# Patient Record
Sex: Female | Born: 1935 | Race: White | Hispanic: No | Marital: Married | Smoking: Never smoker
Health system: Southern US, Community
[De-identification: ages and names within clinical notes are randomized; demographics above are authoritative.]

## PROBLEM LIST (undated history)

## (undated) DIAGNOSIS — N6019 Diffuse cystic mastopathy of unspecified breast: Secondary | ICD-10-CM

## (undated) DIAGNOSIS — F32A Depression, unspecified: Secondary | ICD-10-CM

## (undated) DIAGNOSIS — I10 Essential (primary) hypertension: Secondary | ICD-10-CM

## (undated) DIAGNOSIS — K222 Esophageal obstruction: Secondary | ICD-10-CM

## (undated) DIAGNOSIS — K589 Irritable bowel syndrome without diarrhea: Secondary | ICD-10-CM

## (undated) DIAGNOSIS — R51 Headache: Secondary | ICD-10-CM

## (undated) DIAGNOSIS — F329 Major depressive disorder, single episode, unspecified: Secondary | ICD-10-CM

## (undated) DIAGNOSIS — C50919 Malignant neoplasm of unspecified site of unspecified female breast: Secondary | ICD-10-CM

## (undated) DIAGNOSIS — T7840XA Allergy, unspecified, initial encounter: Secondary | ICD-10-CM

## (undated) DIAGNOSIS — F419 Anxiety disorder, unspecified: Secondary | ICD-10-CM

## (undated) DIAGNOSIS — G8929 Other chronic pain: Secondary | ICD-10-CM

## (undated) DIAGNOSIS — K5790 Diverticulosis of intestine, part unspecified, without perforation or abscess without bleeding: Secondary | ICD-10-CM

## (undated) DIAGNOSIS — Z923 Personal history of irradiation: Secondary | ICD-10-CM

## (undated) DIAGNOSIS — K219 Gastro-esophageal reflux disease without esophagitis: Secondary | ICD-10-CM

## (undated) DIAGNOSIS — R32 Unspecified urinary incontinence: Secondary | ICD-10-CM

## (undated) DIAGNOSIS — K2270 Barrett's esophagus without dysplasia: Secondary | ICD-10-CM

## (undated) DIAGNOSIS — M1990 Unspecified osteoarthritis, unspecified site: Secondary | ICD-10-CM

## (undated) DIAGNOSIS — E785 Hyperlipidemia, unspecified: Secondary | ICD-10-CM

## (undated) DIAGNOSIS — R519 Headache, unspecified: Secondary | ICD-10-CM

## (undated) HISTORY — DX: Irritable bowel syndrome, unspecified: K58.9

## (undated) HISTORY — DX: Hyperlipidemia, unspecified: E78.5

## (undated) HISTORY — PX: OTHER SURGICAL HISTORY: SHX169

## (undated) HISTORY — PX: DILATION AND CURETTAGE OF UTERUS: SHX78

## (undated) HISTORY — PX: ABDOMINAL HYSTERECTOMY: SHX81

## (undated) HISTORY — DX: Unspecified urinary incontinence: R32

## (undated) HISTORY — DX: Allergy, unspecified, initial encounter: T78.40XA

## (undated) HISTORY — DX: Gastro-esophageal reflux disease without esophagitis: K21.9

## (undated) HISTORY — PX: CARPAL TUNNEL RELEASE: SHX101

## (undated) HISTORY — DX: Esophageal obstruction: K22.2

## (undated) HISTORY — PX: COLONOSCOPY: SHX174

## (undated) HISTORY — DX: Barrett's esophagus without dysplasia: K22.70

## (undated) HISTORY — DX: Diffuse cystic mastopathy of unspecified breast: N60.19

## (undated) HISTORY — DX: Major depressive disorder, single episode, unspecified: F32.9

## (undated) HISTORY — DX: Anxiety disorder, unspecified: F41.9

## (undated) HISTORY — DX: Headache: R51

## (undated) HISTORY — DX: Malignant neoplasm of unspecified site of unspecified female breast: C50.919

## (undated) HISTORY — DX: Other chronic pain: G89.29

## (undated) HISTORY — PX: UPPER GASTROINTESTINAL ENDOSCOPY: SHX188

## (undated) HISTORY — PX: BREAST BIOPSY: SHX20

## (undated) HISTORY — DX: Essential (primary) hypertension: I10

## (undated) HISTORY — DX: Diverticulosis of intestine, part unspecified, without perforation or abscess without bleeding: K57.90

## (undated) HISTORY — PX: CATARACT EXTRACTION: SUR2

## (undated) HISTORY — DX: Depression, unspecified: F32.A

## (undated) HISTORY — DX: Unspecified osteoarthritis, unspecified site: M19.90

## (undated) HISTORY — DX: Headache, unspecified: R51.9

---

## 2001-11-06 ENCOUNTER — Encounter: Payer: Self-pay | Admitting: Internal Medicine

## 2003-10-11 ENCOUNTER — Encounter: Admission: RE | Admit: 2003-10-11 | Discharge: 2003-10-11 | Payer: Self-pay | Admitting: Otolaryngology

## 2005-02-12 ENCOUNTER — Ambulatory Visit: Payer: Self-pay | Admitting: Internal Medicine

## 2005-09-18 ENCOUNTER — Ambulatory Visit: Payer: Self-pay | Admitting: General Practice

## 2005-09-18 ENCOUNTER — Other Ambulatory Visit: Payer: Self-pay

## 2005-09-25 ENCOUNTER — Ambulatory Visit: Payer: Self-pay | Admitting: General Practice

## 2007-05-20 ENCOUNTER — Ambulatory Visit: Payer: Self-pay | Admitting: General Practice

## 2009-04-11 DIAGNOSIS — K5730 Diverticulosis of large intestine without perforation or abscess without bleeding: Secondary | ICD-10-CM | POA: Insufficient documentation

## 2009-04-11 DIAGNOSIS — K219 Gastro-esophageal reflux disease without esophagitis: Secondary | ICD-10-CM

## 2009-04-11 DIAGNOSIS — F419 Anxiety disorder, unspecified: Secondary | ICD-10-CM

## 2009-04-11 DIAGNOSIS — K222 Esophageal obstruction: Secondary | ICD-10-CM

## 2009-04-11 DIAGNOSIS — K589 Irritable bowel syndrome without diarrhea: Secondary | ICD-10-CM | POA: Insufficient documentation

## 2009-04-11 DIAGNOSIS — R109 Unspecified abdominal pain: Secondary | ICD-10-CM | POA: Insufficient documentation

## 2009-04-11 DIAGNOSIS — F329 Major depressive disorder, single episode, unspecified: Secondary | ICD-10-CM

## 2009-04-17 ENCOUNTER — Ambulatory Visit: Payer: Self-pay | Admitting: Internal Medicine

## 2009-04-18 ENCOUNTER — Encounter: Payer: Self-pay | Admitting: Internal Medicine

## 2009-04-18 ENCOUNTER — Ambulatory Visit: Payer: Self-pay | Admitting: Internal Medicine

## 2009-04-20 ENCOUNTER — Encounter: Payer: Self-pay | Admitting: Internal Medicine

## 2009-05-15 ENCOUNTER — Telehealth: Payer: Self-pay | Admitting: Internal Medicine

## 2009-11-13 ENCOUNTER — Telehealth: Payer: Self-pay | Admitting: Internal Medicine

## 2010-09-06 NOTE — Progress Notes (Signed)
Summary: refill  Phone Note From Pharmacy Call back at [phone]   Caller: Shawna Orleans, pharm tech Call For: Dr. Juanda Chance  Summary of Call: needs refill of Bentyl  Initial call taken by: Vallarie Mare,  November 13, 2009 2:53 PM    Prescriptions: BENTYL 20 MG TABS (DICYCLOMINE HCL) Take 1 tablet by mouth two times a day  #60 x 3   Entered by:   Hortense Ramal CMA (AAMA)   Authorized by:   Hart Carwin MD   Signed by:   Hortense Ramal CMA (AAMA) on 11/13/2009   Method used:   Electronically to        [facility] [address] (retail)       [address]       [place], [place]  [address]       Ph: [phone]       Fax: [phone]   RxID:   [id number]

## 2010-11-09 LAB — GLUCOSE, CAPILLARY: Glucose-Capillary: 78 mg/dL (ref 70–99)

## 2011-05-24 ENCOUNTER — Encounter: Payer: Self-pay | Admitting: Internal Medicine

## 2011-09-09 ENCOUNTER — Encounter: Payer: Self-pay | Admitting: *Deleted

## 2011-09-17 ENCOUNTER — Encounter: Payer: Self-pay | Admitting: Internal Medicine

## 2011-09-17 ENCOUNTER — Ambulatory Visit (INDEPENDENT_AMBULATORY_CARE_PROVIDER_SITE_OTHER): Payer: Medicare Other | Admitting: Internal Medicine

## 2011-09-17 VITALS — BP 134/76 | HR 80 | Ht 63.0 in | Wt 191.0 lb

## 2011-09-17 DIAGNOSIS — K2270 Barrett's esophagus without dysplasia: Secondary | ICD-10-CM

## 2011-09-17 DIAGNOSIS — R197 Diarrhea, unspecified: Secondary | ICD-10-CM

## 2011-09-17 MED ORDER — CILIDINIUM-CHLORDIAZEPOXIDE 2.5-5 MG PO CAPS: 1.0000 | ORAL_CAPSULE | Freq: Two times a day (BID) | ORAL | Status: DC

## 2011-09-17 MED ORDER — COLESTIPOL HCL 1 G PO TABS: ORAL_TABLET | ORAL | Status: DC

## 2011-09-17 NOTE — Patient Instructions (Signed)
You have been scheduled for an endoscopy with propofol. Please follow written instructions given to you at your visit today. You will be due for a recall colonoscopy in 04/2014. We will send you a reminder in the mail when it gets closer to that time. We have sent the following medications to your pharmacy for you to pick up at your convenience: Colestid 2 tablets daily Librax twice daily CC: Dr Alonna Buckler

## 2011-09-17 NOTE — Progress Notes (Signed)
Paige Dougherty [birth year] MRN [id number]   History of Present Illness:  This is a 78 year old white female with a history of Barrett's esophagus diagnosed on an upper endoscopy in September 2010. She is here for a recall upper endoscopy. She is a diabetic. She also has a family history of colon cancer in her father and underwent her last colonoscopy in September 2010 with findings of a tubular adenoma. She will be due for a repeat colonoscopy in September 2015. She has a history of depression controlled on Paxil. She has chronic diarrhea attributed to irritable bowel syndrome. She has been on Bentyl 20 mg which does not seem to be helping. Random biopsies of the colon in 2010 did not show any evidence of microscopic colitis.   Past Medical History  Diagnosis Date  . Diverticulosis   . GERD (gastroesophageal reflux disease)   . Esophageal stricture   . Depression   . Anxiety   . Barrett's esophagus   . IBS (irritable bowel syndrome)   . Adenomatous colon polyp   . Diabetes mellitus   . Hypertension   . Hyperlipemia    Past Surgical History  Procedure Date  . Dilation and curettage of uterus   . Abdominal hysterectomy   . Cataract extraction     bilateral   . Carpal tunnel release     bilateral  . Finger cyst removal   . Neck cyst removal     reports that she has never smoked. She has never used smokeless tobacco. She reports that she drinks alcohol. She reports that she does not use illicit drugs. family history includes Breast cancer in her maternal grandmother; Colon cancer in her father; Hypertension in her mother; and Thyroid disease in her father and mother. Allergies  Allergen Reactions  . Penicillins   . Tetracycline         Review of Systems: Occasional acid reflux. Denies dysphagia. Denies abdominal pain positive for occasional rectal bleeding  The remainder of the 10 point ROS is negative except as outlined in H&P   Physical Exam: General appearance  Well  developed, in no distress. Overweight Eyes- non icteric. HEENT nontraumatic, normocephalic. Mouth no lesions, tongue papillated, no cheilosis. Neck supple without adenopathy, thyroid not enlarged, no carotid bruits, no JVD. Lungs Clear to auscultation bilaterally. Cor normal S1, normal S2, regular rhythm, no murmur,  quiet precordium. Abdomen: Obese soft with tenderness in left lower quadrant. Normal active bowel sounds. Liver edge at costal margin. Rectal: Sulfa and Hemoccult negative stool Extremities no pedal edema. Skin no lesions. Neurological alert and oriented x 3. Psychological normal mood and affect.  Assessment and Plan:  Problem #1 Barrett's esophagus. Patient is on Prilosec 20 mg daily. We will repeat an upper endoscopy and biopsies to rule out dysplasia. She will continue on anti-reflex measures.  Problem #2 History of adenomatous polyp. Her last colonoscopy was in September 2010. She has a family history of colon cancer in a direct relative. She will have a repeat colonoscopy in September 2015.  Problem #3 Chronic diarrhea, likely irritable bowel syndrome. We will add Colestid 2 g daily and Librax 1 by mouth twice a day in place of Bentyl.   09/17/2011 Lina Sar

## 2011-10-07 ENCOUNTER — Ambulatory Visit (AMBULATORY_SURGERY_CENTER): Payer: Medicare Other | Admitting: Internal Medicine

## 2011-10-07 ENCOUNTER — Encounter: Payer: Self-pay | Admitting: Internal Medicine

## 2011-10-07 DIAGNOSIS — K222 Esophageal obstruction: Secondary | ICD-10-CM

## 2011-10-07 DIAGNOSIS — K2980 Duodenitis without bleeding: Secondary | ICD-10-CM

## 2011-10-07 DIAGNOSIS — K2960 Other gastritis without bleeding: Secondary | ICD-10-CM

## 2011-10-07 DIAGNOSIS — K2270 Barrett's esophagus without dysplasia: Secondary | ICD-10-CM

## 2011-10-07 DIAGNOSIS — R1013 Epigastric pain: Secondary | ICD-10-CM

## 2011-10-07 MED ORDER — OMEPRAZOLE 20 MG PO CPDR: 20.0000 mg | DELAYED_RELEASE_CAPSULE | Freq: Two times a day (BID) | ORAL | Status: DC

## 2011-10-07 MED ORDER — SODIUM CHLORIDE 0.9 % IV SOLN: 500.0000 mL | INTRAVENOUS | Status: DC

## 2011-10-07 NOTE — Patient Instructions (Signed)

## 2011-10-07 NOTE — Progress Notes (Signed)
Patient did not experience any of the following events: a burn prior to discharge; a fall within the facility; wrong site/side/patient/procedure/implant event; or a hospital transfer or hospital admission upon discharge from the facility. (G8907) Patient with preoperative order for IV antibiotic SSI prophylaxis, antibiotic initiated on time. (G8916)  

## 2011-10-07 NOTE — Op Note (Signed)
 [facility] [address] [place], [place]  [address]  ENDOSCOPY PROCEDURE REPORT  PATIENT:  Paige Dougherty, Paige Dougherty  MR#:  [id number] BIRTHDATE:  Jun 28, 1933, 78 yrs. old  GENDER:  female  ENDOSCOPIST:  Hedwig Morton. Juanda Chance, MD Referred by:  Alonna Buckler, M.D.  PROCEDURE DATE:  10/07/2011 PROCEDURE:  EGD with biopsy, 43239 ASA CLASS:  Class II INDICATIONS:  abdominal pain, h/o Barrett's Esophagus EGD 09/2008- Barrett's now postpradial abd. pain.refractory to Prilosec 20 mg qd  MEDICATIONS:   MAC sedation, administered by CRNA, propofol (Diprivan) 340 mg TOPICAL ANESTHETIC:  none  DESCRIPTION OF PROCEDURE:   After the risks benefits and alternatives of the procedure were thoroughly explained, informed consent was obtained.  The LB GIF-H180 [id number] endoscope was introduced through the mouth and advanced to the second portion of the duodenum, without limitations.  The instrument was slowly withdrawn as the mucosa was fully examined. <<PROCEDUREIMAGES>>  Esophagitis was found in the distal esophagus. grade 1 esophagitis, nonobstructing fibrous ring With standard forceps, a biopsy was obtained and sent to pathology (see image1, image2, image12, image10, and image11).  There were multiple polyps identified (see image9, image8, and image7). fundic gland polyps Mild gastritis was found. With standard forceps, a biopsy was obtained and sent to pathology. r/o H (see image6).Pylori  The duodenal bulb was normal in appearance, as was the postbulbar duodenum. With standard forceps, a biopsy was obtained and sent to pathology (see image4 and image5). duodenum to Ligament of Treitz Retroflexed views revealed no abnormalities.    The scope was then withdrawn from the patient and the procedure completed.  COMPLICATIONS:  None  ENDOSCOPIC IMPRESSION: 1) Esophagitis in the distal esophagus 2) Polyps, multiple 3) Mild gastritis 4) Normal duodenum RECOMMENDATIONS: 1) Await biopsy  results increase Prilosec to 20 mg po bid  REPEAT EXAM:  In 2 year(s) for.  ______________________________ Hedwig Morton. Juanda Chance, MD  CC:  n. eSIGNED:   Hedwig Morton. Joclynn Lumb at 10/07/2011 02:44 PM  Neill Loft, [phone]

## 2011-10-08 ENCOUNTER — Telehealth: Payer: Self-pay | Admitting: *Deleted

## 2011-10-08 NOTE — Telephone Encounter (Signed)
  Follow up Call-  Call back number 10/07/2011  Post procedure Call Back phone  # [phone]  Permission to leave phone message Yes     Patient questions:  Do you have a fever, pain , or abdominal swelling? no Pain Score  0 *  Have you tolerated food without any problems? yes  Have you been able to return to your normal activities? yes  Do you have any questions about your discharge instructions: Diet   no Medications  no Follow up visit  no  Do you have questions or concerns about your Care? yes  Actions: * If pain score is 4 or above: No action needed, pain <4.

## 2011-10-15 ENCOUNTER — Encounter: Payer: Self-pay | Admitting: Internal Medicine

## 2014-02-21 ENCOUNTER — Encounter: Payer: Self-pay | Admitting: Internal Medicine

## 2014-02-22 DIAGNOSIS — I10 Essential (primary) hypertension: Secondary | ICD-10-CM | POA: Insufficient documentation

## 2014-02-22 DIAGNOSIS — E119 Type 2 diabetes mellitus without complications: Secondary | ICD-10-CM | POA: Insufficient documentation

## 2014-02-22 DIAGNOSIS — E7849 Other hyperlipidemia: Secondary | ICD-10-CM | POA: Insufficient documentation

## 2014-08-05 DIAGNOSIS — Z923 Personal history of irradiation: Secondary | ICD-10-CM

## 2014-08-05 DIAGNOSIS — C50919 Malignant neoplasm of unspecified site of unspecified female breast: Secondary | ICD-10-CM

## 2014-08-05 HISTORY — PX: BREAST EXCISIONAL BIOPSY: SUR124

## 2014-08-05 HISTORY — DX: Personal history of irradiation: Z92.3

## 2014-08-05 HISTORY — PX: BREAST LUMPECTOMY: SHX2

## 2014-08-05 HISTORY — DX: Malignant neoplasm of unspecified site of unspecified female breast: C50.919

## 2014-09-13 ENCOUNTER — Ambulatory Visit: Payer: Self-pay | Admitting: Family Medicine

## 2014-09-13 LAB — CREATININE, SERUM: Creatine, Serum: 0.88

## 2014-09-20 ENCOUNTER — Ambulatory Visit: Payer: Self-pay | Admitting: Internal Medicine

## 2014-09-28 ENCOUNTER — Ambulatory Visit: Payer: Self-pay | Admitting: Surgery

## 2014-10-04 ENCOUNTER — Ambulatory Visit
Admit: 2014-10-04 | Disposition: A | Discharge status: Home / Self Care | Payer: Self-pay | Attending: Oncology | Admitting: Oncology

## 2014-10-04 ENCOUNTER — Ambulatory Visit
Admit: 2014-10-04 | Disposition: A | Discharge status: Home / Self Care | Payer: Self-pay | Attending: Internal Medicine | Admitting: Internal Medicine

## 2014-10-07 ENCOUNTER — Ambulatory Visit: Payer: Self-pay | Admitting: Surgery

## 2014-10-12 ENCOUNTER — Encounter: Payer: Self-pay | Admitting: Internal Medicine

## 2014-11-04 ENCOUNTER — Ambulatory Visit
Admit: 2014-11-04 | Disposition: A | Discharge status: Home / Self Care | Payer: Self-pay | Attending: Oncology | Admitting: Oncology

## 2014-11-04 ENCOUNTER — Ambulatory Visit
Admit: 2014-11-04 | Disposition: A | Discharge status: Home / Self Care | Payer: Self-pay | Attending: Internal Medicine | Admitting: Internal Medicine

## 2014-11-18 LAB — CBC CANCER CENTER
BASOS PCT: 0.9 %
Basophil #: 0 x10 3/mm (ref 0.0–0.1)
EOS ABS: 0.1 x10 3/mm (ref 0.0–0.7)
EOS PCT: 2 %
HCT: 41.4 % (ref 35.0–47.0)
HGB: 13.6 g/dL (ref 12.0–16.0)
LYMPHS PCT: 25.7 %
Lymphocyte #: 1.2 x10 3/mm (ref 1.0–3.6)
MCH: 29.6 pg (ref 26.0–34.0)
MCHC: 32.8 g/dL (ref 32.0–36.0)
MCV: 90 fL (ref 80–100)
Monocyte #: 0.3 x10 3/mm (ref 0.2–0.9)
Monocyte %: 7.4 %
NEUTROS ABS: 3 x10 3/mm (ref 1.4–6.5)
Neutrophil %: 64 %
Platelet: 242 x10 3/mm (ref 150–440)
RBC: 4.59 10*6/uL (ref 3.80–5.20)
RDW: 14.6 % — ABNORMAL HIGH (ref 11.5–14.5)
WBC: 4.7 x10 3/mm (ref 3.6–11.0)

## 2014-11-25 LAB — CBC CANCER CENTER
BASOS ABS: 0 x10 3/mm (ref 0.0–0.1)
Basophil %: 0.8 %
Eosinophil #: 0.1 x10 3/mm (ref 0.0–0.7)
Eosinophil %: 2.4 %
HCT: 38.8 % (ref 35.0–47.0)
HGB: 13 g/dL (ref 12.0–16.0)
LYMPHS ABS: 1 x10 3/mm (ref 1.0–3.6)
LYMPHS PCT: 19.2 %
MCH: 30.1 pg (ref 26.0–34.0)
MCHC: 33.6 g/dL (ref 32.0–36.0)
MCV: 90 fL (ref 80–100)
Monocyte #: 0.5 x10 3/mm (ref 0.2–0.9)
Monocyte %: 9.6 %
NEUTROS ABS: 3.6 x10 3/mm (ref 1.4–6.5)
Neutrophil %: 68 %
Platelet: 228 x10 3/mm (ref 150–440)
RBC: 4.33 10*6/uL (ref 3.80–5.20)
RDW: 14.7 % — AB (ref 11.5–14.5)
WBC: 5.3 x10 3/mm (ref 3.6–11.0)

## 2014-11-28 DIAGNOSIS — R32 Unspecified urinary incontinence: Secondary | ICD-10-CM | POA: Insufficient documentation

## 2014-11-28 DIAGNOSIS — R51 Headache: Secondary | ICD-10-CM

## 2014-11-28 DIAGNOSIS — G8929 Other chronic pain: Secondary | ICD-10-CM | POA: Insufficient documentation

## 2014-11-28 DIAGNOSIS — N6019 Diffuse cystic mastopathy of unspecified breast: Secondary | ICD-10-CM | POA: Insufficient documentation

## 2014-11-28 DIAGNOSIS — R519 Headache, unspecified: Secondary | ICD-10-CM | POA: Insufficient documentation

## 2014-11-28 LAB — SURGICAL PATHOLOGY

## 2014-12-01 ENCOUNTER — Other Ambulatory Visit: Payer: Self-pay | Admitting: *Deleted

## 2014-12-01 DIAGNOSIS — C50211 Malignant neoplasm of upper-inner quadrant of right female breast: Secondary | ICD-10-CM

## 2014-12-02 ENCOUNTER — Ambulatory Visit: Admission: RE | Admit: 2014-12-02 | Payer: Commercial Managed Care - HMO | Source: Ambulatory Visit

## 2014-12-02 LAB — CBC CANCER CENTER
BASOS ABS: 0 x10 3/mm (ref 0.0–0.1)
Basophil %: 0.5 %
Eosinophil #: 0.1 x10 3/mm (ref 0.0–0.7)
Eosinophil %: 1.5 %
HCT: 41.3 % (ref 35.0–47.0)
HGB: 13.3 g/dL (ref 12.0–16.0)
Lymphocyte #: 0.8 x10 3/mm — ABNORMAL LOW (ref 1.0–3.6)
Lymphocyte %: 15.6 %
MCH: 29.5 pg (ref 26.0–34.0)
MCHC: 32.2 g/dL (ref 32.0–36.0)
MCV: 91 fL (ref 80–100)
MONOS PCT: 7.6 %
Monocyte #: 0.4 x10 3/mm (ref 0.2–0.9)
NEUTROS ABS: 4 x10 3/mm (ref 1.4–6.5)
NEUTROS PCT: 74.8 %
PLATELETS: 220 x10 3/mm (ref 150–440)
RBC: 4.52 10*6/uL (ref 3.80–5.20)
RDW: 14.9 % — AB (ref 11.5–14.5)
WBC: 5.3 x10 3/mm (ref 3.6–11.0)

## 2014-12-03 ENCOUNTER — Ambulatory Visit: Admission: RE | Admit: 2014-12-03 | Payer: Commercial Managed Care - HMO | Source: Ambulatory Visit

## 2014-12-04 NOTE — Consult Note (Signed)
Reason for Visit: This 81 year old Female patient presents to the clinic for initial evaluation of  breast cancer .   Referred by Dr. Tamala Julian.  Diagnosis:  Chief Complaint/Diagnosis   81 year old female with initial diagnosis of stage I (T1c NX M0) invasive mammary carcinoma of the right breast here for discussion with medical oncology as well as radiation oncology.Tumor is ER/PR positive HER-2/neu not overexpressed.  Pathology Report pathology report reviewed   Imaging Report mammograms reviewed   Referral Report clinical notes reviewed   Planned Treatment Regimen breast conservation versus mastectomy   HPI   patient's a pleasant 81 year old female who initially noted a mass in the right breast at the 1:00 position in the upper inner quadrant. Mammograms demonstrated a 1.9 cm mass at the 1:00 position suspicious for malignancy. Should 1 ultrasound-guided biopsy which was positive for grade 2 invasive mammary carcinoma ER/PR positive HER-2/neu not overexpressed. She is contemplating her surgical options at this time between mastectomy and breast conservation. She is otherwise doing well is in excellent general condition. She specifically denies breast tenderness cough or bone pain.  Past Hx:    urinary incontinence:    fibrocystic breast disease:    depression:    allergic rhinitis:    chronic HA:    reflux esophagitis:    IBS:    anxiety:    HTN:   Past, Family and Social History:  Past Medical History positive   Cardiovascular hyperlipidemia; hypertension   Respiratory allergic rhinitis   Gastrointestinal GERD; irritable bowel syndrome   Genitourinary kidney stones; urinary incontinence   Neurological/Psychiatric anxiety; depression   Past Surgical History hysterectomy, tonsillectomy, carpal tunnel surgery   Family History positive   Family History Comments mother with dementia hypertension hyperlipidemia, father with colon cancer and hypertension   Social  History noncontributory   Additional Past Medical and Surgical History accompanied by daughter and nurse navigator today   Allergies:   PCN: Rash  Tetracycline: Rash  Eggs: GI Distress  Other- Explain in Comments Line: Other  Prednisone: Other  Avapro: Unknown  Sulfa drugs: Unknown  Home Meds:  Home Medications: Medication Instructions Status  enalapril 10 mg oral tablet 1 tab(s) orally once a day Active  hydrochlorothiazide 25 mg oral tablet 1 tab(s) orally once a day Active  indomethacin 25 mg oral capsule 1 cap(s) orally 3 times a day Active  metFORMIN 500 mg oral tablet 1 tab(s) orally once a day Active  pantoprazole 40 mg oral delayed release tablet 1 tab(s) orally once a day Active  PARoxetine 10 mg oral tablet 1 tab(s) orally once a day Active  Red Yeast Rice 600 mg oral capsule 2 cap(s) orally once a day Active  grapeseed extract 4 cap(s) orally once a day Active  blue green algae 5 tab(s) orally once a day Active  nes grops ( 7 different types) 1 drop(s) orally once a day Active  essiac mixed distilled water 6 ounces 1 dose(s) orally once a day Active   Review of Systems:  General negative   Performance Status (ECOG) 0   Skin negative   Breast see HPI   Ophthalmologic negative   ENMT negative   Respiratory and Thorax negative   Cardiovascular negative   Gastrointestinal negative   Genitourinary negative   Musculoskeletal negative   Neurological negative   Psychiatric negative   Hematology/Lymphatics negative   Endocrine negative   Allergic/Immunologic negative   Review of Systems   denies any weight loss, fatigue, weakness, fever, chills  or night sweats. Patient denies any loss of vision, blurred vision. Patient denies any ringing  of the ears or hearing loss. No irregular heartbeat. Patient denies heart murmur or history of fainting. Patient denies any chest pain or pain radiating to her upper extremities. Patient denies any shortness of  breath, difficulty breathing at night, cough or hemoptysis. Patient denies any swelling in the lower legs. Patient denies any nausea vomiting, vomiting of blood, or coffee ground material in the vomitus. Patient denies any stomach pain. Patient states has had normal bowel movements no significant constipation or diarrhea. Patient denies any dysuria, hematuria or significant nocturia. Patient denies any problems walking, swelling in the joints or loss of balance. Patient denies any skin changes, loss of hair or loss of weight. Patient denies any excessive worrying or anxiety or significant depression. Patient denies any problems with insomnia. Patient denies excessive thirst, polyuria, polydipsia. Patient denies any swollen glands, patient denies easy bruising or easy bleeding. Patient denies any recent infections, allergies or URI. Patient "s visual fields have not changed significantly in recent time.   Nursing Notes:  Nursing Vital Signs and Chemo Nursing Nursing Notes: *CC Vital Signs Flowsheet:   16-Feb-16 14:06  Temp Temperature 98.8  Pulse Pulse 105  Respirations Respirations 18  SBP SBP 152  DBP DBP 85  Pain Scale (0-10)  0  Current Weight (kg) (kg) 85.1   Physical Exam:  General/Skin/HEENT:  General normal   Skin normal   Eyes normal   ENMT normal   Additional PE well-developed elderly female in NAD. Lungs are clear to A&P cardiac examination shows regular rate and rhythm. Left breast has approximately 1.5 cm mass the 1:00 position in the upper inner quadrant consistent with known malignancy. No other dominant mass or nodularity is noted in either breast in 2 positions examined. No axillary or supraclavicular adenopathy is identified.   Breasts/Resp/CV/GI/GU:  Respiratory and Thorax normal   Cardiovascular normal   Gastrointestinal normal   Genitourinary normal   MS/Neuro/Psych/Lymph:  Musculoskeletal normal   Neurological normal   Lymphatics normal   Relevent  Results:   Relevant Scans and Labs mammogram 7 requested for my review   Assessment and Plan: Impression:   as yet unstaged invasive mammary carcinoma of the right breast in 81 year old female. Plan:   at this time of gone over discussions with breast conservation versus mastectomy and recent data showing increased survival which is fairly significant in patient's choosing breast conservation over mastectomy in early stage breast cancer. I described the risks and benefits of treatment including skin reaction fatigue inclusion of some superficial lung, possible alteration blood counts with the patient and her daughter. Patient will now be choosing between wide local excision and sentinel node biopsy versus mastectomy. I would follow-up the patient should she choose breast conservation therapy. She is also seeing medical oncology today to discuss treatment options as far as systemic treatment is concerned. Patient and daughter both note a call with any further concerns.  I would like to take this opportunity for allowing me to participate in the care of your patient..  Fax to Physician:  Physicians To Recieve Fax: BABAOFF, Joseph Berkshire, SMS OC User - [phone] Rochel Brome - [phone].  Electronic Signatures: Kemberly Taves, Roda Shutters (MD)  (Signed 17-Feb-16 10:26)  Authored: HPI, Diagnosis, Past Hx, PFSH, Allergies, Home Meds, ROS, Nursing Notes, Physical Exam, Relevent Results, Encounter Assessment and Plan, Fax to Physician   Last Updated: 17-Feb-16 10:26 by Armstead Peaks (MD)

## 2014-12-04 NOTE — Op Note (Signed)
PATIENT NAME:  Paige Dougherty, Paige Dougherty MR#:  [id number] DATE OF BIRTH:  03/10/1933  DATE OF PROCEDURE:  10/07/2014  PREOPERATIVE DIAGNOSIS: Carcinoma of the right breast.   POSTOPERATIVE DIAGNOSIS: Carcinoma of the right breast.   PROCEDURE: Right partial mastectomy with axillary sentinel lymph node biopsy.   SURGEON: Rochel Brome, MD  ANESTHESIA: General.   INDICATIONS: This 81 year old female recently has had a palpable mass of the upper inner quadrant of the right breast 7 cm from the nipple. Ultrasound demonstrated an irregular mass some 2 cm in dimension. Biopsy demonstrated infiltrating mammary carcinoma. Surgery was recommended for definitive treatment. She did have preoperative injection of radioactive technetium sulfur colloid.   DESCRIPTION OF PROCEDURE: The patient was placed on the operating table in the supine position under general anesthesia. The right arm was placed on a lateral arm support. The breast was examined and did palpate a mass consistent with some 2 cm in dimension in the upper outer quadrant of the right breast. I also examined this with ultrasound demonstrating the hypoechoic irregular 2 cm nodule which was the same lesion as the mass. Next, the breast and axilla were prepared with ChloraPrep and draped in a sterile manner.   A curvilinear incision was made from the 12 o'clock position to the 2 o'clock position. I did remove an ellipse of skin, which was approximately 1 cm in width, and carried down through subcutaneous tissues and could more easily palpate the mass and while palpating the mass dissection was carried out to excise it, removing normal tissue surrounding the mass. This dissection did extend down to the deep fascia. The 2 o'clock end of the skin ellipse was tagged with a 3-0 nylon stitch before excising and then after excising the specimen was marked with margin maps to suture markers to the medial, lateral, cranial, caudal, and deep margins. The specimen was further  examined and it appeared that margins were clear. This was submitted for pathology.   Next, attention was turned to the axilla which was probed with a gamma counter demonstrating location of radioactivity in the inferior aspect of the axilla. An oblique incision was made some 5 cm in length in the inferior aspect of the axilla and carried down through subcutaneous tissues. Several small bleeding points were cauterized. Superficial fascia was incised. Dissection was carried down deeply within the axilla and used the gamma counter to demonstrate location of radioactivity and deeply within the axilla adjacent to the chest wall there was a finding of a localized area of radioactivity and a palpable lymph node. This was dissected free from surrounding structures and did include some surrounding fatty tissue with a lymph node. The ex vivo count was greater than 1400 counts per second. The background count was less than 25 counts per second. There was no remaining palpable mass within the axilla and several small bleeding points were cauterized. Hemostasis subsequently appeared to be intact. The lymph node was submitted for routine pathology.  It is noted that during the course of the procedure the pathologist did call back to indicate that the resection margins appeared satisfactory.   The partial mastectomy wound was further examined and could see hemostasis was intact. Tissues just superficial to the muscle were approximated with interrupted 3-0 Vicryl. Next, the subcutaneous tissues for both wounds were injected with 0.5% Sensorcaine with epinephrine. Next, the subcutaneous tissues of the partial mastectomy wound were closed with interrupted 4-0 chromic and the skin was closed with running 4-0 Monocryl subcuticular suture.  Attention was turned back to the axilla and it appeared that hemostasis was intact. The subcutaneous tissues were closed with 4-0 chromic, and the skin was closed with running 4-0 Monocryl  subcuticular suture. Both wounds were treated with LiquiBand and allowed to dry, and the patient was awoken and prepared for transfer to the recovery room.  ____________________________ Lenna Sciara. Rochel Brome, MD jws:sb D: 10/07/2014 11:52:24 ET T: 10/07/2014 13:18:16 ET JOB#: [id number]  cc: Loreli Dollar, MD, <Dictator> Loreli Dollar MD ELECTRONICALLY SIGNED 10/13/2014 13:37

## 2014-12-05 ENCOUNTER — Ambulatory Visit: Payer: Commercial Managed Care - HMO | Admitting: Radiation Oncology

## 2014-12-05 ENCOUNTER — Ambulatory Visit
Admission: RE | Admit: 2014-12-05 | Discharge: 2014-12-05 | Disposition: A | Discharge status: Home / Self Care | Payer: Commercial Managed Care - HMO | Source: Ambulatory Visit | Attending: Radiation Oncology | Admitting: Radiation Oncology

## 2014-12-05 DIAGNOSIS — C50211 Malignant neoplasm of upper-inner quadrant of right female breast: Secondary | ICD-10-CM | POA: Insufficient documentation

## 2014-12-05 DIAGNOSIS — Z510 Encounter for antineoplastic radiation therapy: Secondary | ICD-10-CM | POA: Diagnosis not present

## 2014-12-06 ENCOUNTER — Ambulatory Visit
Admission: RE | Admit: 2014-12-06 | Discharge: 2014-12-06 | Disposition: A | Discharge status: Home / Self Care | Payer: Commercial Managed Care - HMO | Source: Ambulatory Visit | Attending: Radiation Oncology | Admitting: Radiation Oncology

## 2014-12-06 DIAGNOSIS — Z510 Encounter for antineoplastic radiation therapy: Secondary | ICD-10-CM | POA: Diagnosis not present

## 2014-12-07 ENCOUNTER — Ambulatory Visit
Admission: RE | Admit: 2014-12-07 | Discharge: 2014-12-07 | Disposition: A | Discharge status: Home / Self Care | Payer: Commercial Managed Care - HMO | Source: Ambulatory Visit | Attending: Radiation Oncology | Admitting: Radiation Oncology

## 2014-12-07 DIAGNOSIS — Z510 Encounter for antineoplastic radiation therapy: Secondary | ICD-10-CM | POA: Diagnosis not present

## 2014-12-08 ENCOUNTER — Ambulatory Visit
Admission: RE | Admit: 2014-12-08 | Discharge: 2014-12-08 | Disposition: A | Discharge status: Home / Self Care | Payer: Commercial Managed Care - HMO | Source: Ambulatory Visit | Attending: Radiation Oncology | Admitting: Radiation Oncology

## 2014-12-08 DIAGNOSIS — Z510 Encounter for antineoplastic radiation therapy: Secondary | ICD-10-CM | POA: Diagnosis not present

## 2014-12-09 ENCOUNTER — Ambulatory Visit
Admission: RE | Admit: 2014-12-09 | Discharge: 2014-12-09 | Disposition: A | Discharge status: Home / Self Care | Payer: Commercial Managed Care - HMO | Source: Ambulatory Visit | Attending: Radiation Oncology | Admitting: Radiation Oncology

## 2014-12-09 ENCOUNTER — Inpatient Hospital Stay: Payer: Commercial Managed Care - HMO

## 2014-12-09 DIAGNOSIS — Z510 Encounter for antineoplastic radiation therapy: Secondary | ICD-10-CM | POA: Diagnosis not present

## 2014-12-12 ENCOUNTER — Ambulatory Visit
Admission: RE | Admit: 2014-12-12 | Discharge: 2014-12-12 | Disposition: A | Discharge status: Home / Self Care | Payer: Commercial Managed Care - HMO | Source: Ambulatory Visit | Attending: Radiation Oncology | Admitting: Radiation Oncology

## 2014-12-12 DIAGNOSIS — Z510 Encounter for antineoplastic radiation therapy: Secondary | ICD-10-CM | POA: Diagnosis not present

## 2014-12-13 ENCOUNTER — Ambulatory Visit
Admission: RE | Admit: 2014-12-13 | Discharge: 2014-12-13 | Disposition: A | Discharge status: Home / Self Care | Payer: Commercial Managed Care - HMO | Source: Ambulatory Visit | Attending: Radiation Oncology | Admitting: Radiation Oncology

## 2014-12-13 DIAGNOSIS — Z510 Encounter for antineoplastic radiation therapy: Secondary | ICD-10-CM | POA: Diagnosis not present

## 2014-12-14 ENCOUNTER — Ambulatory Visit
Admission: RE | Admit: 2014-12-14 | Discharge: 2014-12-14 | Disposition: A | Discharge status: Home / Self Care | Payer: Commercial Managed Care - HMO | Source: Ambulatory Visit | Attending: Radiation Oncology | Admitting: Radiation Oncology

## 2014-12-14 DIAGNOSIS — Z510 Encounter for antineoplastic radiation therapy: Secondary | ICD-10-CM | POA: Diagnosis not present

## 2014-12-15 ENCOUNTER — Ambulatory Visit
Admission: RE | Admit: 2014-12-15 | Discharge: 2014-12-15 | Disposition: A | Discharge status: Home / Self Care | Payer: Commercial Managed Care - HMO | Source: Ambulatory Visit | Attending: Radiation Oncology | Admitting: Radiation Oncology

## 2014-12-15 DIAGNOSIS — Z510 Encounter for antineoplastic radiation therapy: Secondary | ICD-10-CM | POA: Diagnosis not present

## 2014-12-16 ENCOUNTER — Ambulatory Visit: Payer: Commercial Managed Care - HMO

## 2014-12-16 ENCOUNTER — Ambulatory Visit
Admission: RE | Admit: 2014-12-16 | Discharge: 2014-12-16 | Disposition: A | Discharge status: Home / Self Care | Payer: Commercial Managed Care - HMO | Source: Ambulatory Visit | Attending: Radiation Oncology | Admitting: Radiation Oncology

## 2014-12-16 ENCOUNTER — Inpatient Hospital Stay: Payer: Commercial Managed Care - HMO | Attending: Oncology

## 2014-12-16 DIAGNOSIS — I10 Essential (primary) hypertension: Secondary | ICD-10-CM | POA: Insufficient documentation

## 2014-12-16 DIAGNOSIS — E119 Type 2 diabetes mellitus without complications: Secondary | ICD-10-CM | POA: Insufficient documentation

## 2014-12-16 DIAGNOSIS — Z79899 Other long term (current) drug therapy: Secondary | ICD-10-CM | POA: Insufficient documentation

## 2014-12-16 DIAGNOSIS — C50211 Malignant neoplasm of upper-inner quadrant of right female breast: Secondary | ICD-10-CM | POA: Insufficient documentation

## 2014-12-16 DIAGNOSIS — Z923 Personal history of irradiation: Secondary | ICD-10-CM | POA: Insufficient documentation

## 2014-12-16 DIAGNOSIS — Z79811 Long term (current) use of aromatase inhibitors: Secondary | ICD-10-CM | POA: Insufficient documentation

## 2014-12-16 DIAGNOSIS — Z170 Estrogen receptor positive status [ER+]: Secondary | ICD-10-CM | POA: Insufficient documentation

## 2014-12-19 ENCOUNTER — Ambulatory Visit
Admission: RE | Admit: 2014-12-19 | Discharge: 2014-12-19 | Disposition: A | Discharge status: Home / Self Care | Payer: Commercial Managed Care - HMO | Source: Ambulatory Visit | Attending: Radiation Oncology | Admitting: Radiation Oncology

## 2014-12-19 DIAGNOSIS — Z510 Encounter for antineoplastic radiation therapy: Secondary | ICD-10-CM | POA: Diagnosis not present

## 2014-12-20 ENCOUNTER — Ambulatory Visit
Admission: RE | Admit: 2014-12-20 | Discharge: 2014-12-20 | Disposition: A | Discharge status: Home / Self Care | Payer: Commercial Managed Care - HMO | Source: Ambulatory Visit | Attending: Radiation Oncology | Admitting: Radiation Oncology

## 2014-12-20 DIAGNOSIS — Z510 Encounter for antineoplastic radiation therapy: Secondary | ICD-10-CM | POA: Diagnosis not present

## 2014-12-21 DIAGNOSIS — Z510 Encounter for antineoplastic radiation therapy: Secondary | ICD-10-CM | POA: Diagnosis not present

## 2014-12-22 DIAGNOSIS — Z510 Encounter for antineoplastic radiation therapy: Secondary | ICD-10-CM | POA: Diagnosis not present

## 2014-12-23 ENCOUNTER — Other Ambulatory Visit: Payer: Self-pay | Admitting: *Deleted

## 2014-12-23 ENCOUNTER — Inpatient Hospital Stay: Payer: Commercial Managed Care - HMO

## 2014-12-23 DIAGNOSIS — C50919 Malignant neoplasm of unspecified site of unspecified female breast: Secondary | ICD-10-CM

## 2014-12-23 DIAGNOSIS — Z510 Encounter for antineoplastic radiation therapy: Secondary | ICD-10-CM | POA: Diagnosis not present

## 2014-12-23 DIAGNOSIS — C50211 Malignant neoplasm of upper-inner quadrant of right female breast: Secondary | ICD-10-CM

## 2014-12-23 LAB — CBC
HCT: 39.2 % (ref 35.0–47.0)
Hemoglobin: 13.1 g/dL (ref 12.0–16.0)
MCH: 30.5 pg (ref 26.0–34.0)
MCHC: 33.3 g/dL (ref 32.0–36.0)
MCV: 91.5 fL (ref 80.0–100.0)
Platelets: 221 10*3/uL (ref 150–440)
RBC: 4.29 MIL/uL (ref 3.80–5.20)
RDW: 14.7 % — ABNORMAL HIGH (ref 11.5–14.5)
WBC: 5.6 10*3/uL (ref 3.6–11.0)

## 2014-12-24 ENCOUNTER — Encounter: Payer: Self-pay | Admitting: Oncology

## 2014-12-24 DIAGNOSIS — C50919 Malignant neoplasm of unspecified site of unspecified female breast: Secondary | ICD-10-CM

## 2014-12-24 HISTORY — DX: Malignant neoplasm of unspecified site of unspecified female breast: C50.919

## 2014-12-26 ENCOUNTER — Inpatient Hospital Stay: Payer: Commercial Managed Care - HMO

## 2014-12-26 ENCOUNTER — Encounter (INDEPENDENT_AMBULATORY_CARE_PROVIDER_SITE_OTHER): Payer: Self-pay

## 2014-12-26 ENCOUNTER — Inpatient Hospital Stay (HOSPITAL_BASED_OUTPATIENT_CLINIC_OR_DEPARTMENT_OTHER): Payer: Commercial Managed Care - HMO | Admitting: Oncology

## 2014-12-26 VITALS — BP 135/83 | HR 90 | Temp 96.1°F | Wt 180.3 lb

## 2014-12-26 DIAGNOSIS — Z170 Estrogen receptor positive status [ER+]: Secondary | ICD-10-CM

## 2014-12-26 DIAGNOSIS — E119 Type 2 diabetes mellitus without complications: Secondary | ICD-10-CM | POA: Diagnosis not present

## 2014-12-26 DIAGNOSIS — C50211 Malignant neoplasm of upper-inner quadrant of right female breast: Secondary | ICD-10-CM

## 2014-12-26 DIAGNOSIS — Z79811 Long term (current) use of aromatase inhibitors: Secondary | ICD-10-CM | POA: Diagnosis not present

## 2014-12-26 DIAGNOSIS — Z923 Personal history of irradiation: Secondary | ICD-10-CM | POA: Diagnosis not present

## 2014-12-26 DIAGNOSIS — I10 Essential (primary) hypertension: Secondary | ICD-10-CM

## 2014-12-26 DIAGNOSIS — Z79899 Other long term (current) drug therapy: Secondary | ICD-10-CM | POA: Diagnosis not present

## 2014-12-26 DIAGNOSIS — Z510 Encounter for antineoplastic radiation therapy: Secondary | ICD-10-CM | POA: Diagnosis not present

## 2014-12-26 DIAGNOSIS — C50919 Malignant neoplasm of unspecified site of unspecified female breast: Secondary | ICD-10-CM

## 2014-12-26 MED ORDER — LETROZOLE 2.5 MG PO TABS: 2.5000 mg | ORAL_TABLET | Freq: Every day | ORAL | Status: DC

## 2014-12-26 MED ORDER — CALCIUM CARBONATE-VITAMIN D 500-200 MG-UNIT PO TABS: 1.0000 | ORAL_TABLET | Freq: Two times a day (BID) | ORAL | Status: AC

## 2014-12-27 DIAGNOSIS — Z510 Encounter for antineoplastic radiation therapy: Secondary | ICD-10-CM | POA: Diagnosis not present

## 2014-12-28 DIAGNOSIS — Z510 Encounter for antineoplastic radiation therapy: Secondary | ICD-10-CM | POA: Diagnosis not present

## 2014-12-29 ENCOUNTER — Ambulatory Visit: Payer: Commercial Managed Care - HMO

## 2014-12-29 DIAGNOSIS — Z510 Encounter for antineoplastic radiation therapy: Secondary | ICD-10-CM | POA: Diagnosis not present

## 2014-12-30 ENCOUNTER — Ambulatory Visit
Admission: RE | Admit: 2014-12-30 | Discharge: 2014-12-30 | Disposition: A | Discharge status: Home / Self Care | Payer: Commercial Managed Care - HMO | Source: Ambulatory Visit | Attending: Radiation Oncology | Admitting: Radiation Oncology

## 2014-12-30 DIAGNOSIS — Z510 Encounter for antineoplastic radiation therapy: Secondary | ICD-10-CM | POA: Diagnosis not present

## 2014-12-31 ENCOUNTER — Encounter: Payer: Self-pay | Admitting: Oncology

## 2014-12-31 NOTE — Progress Notes (Signed)
[place] @ [facility] [phone]  Fax:([phone] Echo: Nov 23, 1932  MR#: [id number]  ZES#:[id number]  Patient Care Team: Rocco Serene, MD as PCP - General (Internal Medicine)  CHIEF COMPLAINT:  Chief Complaint  Patient presents with  . 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 cT1c (1.9 x 1.6 cm mass) Nx cM0, grade 2. ER positive (>90%), PR positive (>90%), HER2/neu negative (1+ on IHC). On letrozole had radiation therapy     Cancer of breast   09/23/2014 Initial Diagnosis Cancer of breast    No flowsheet data found.  INTERVAL HISTORY:  81 year old lady with a history of carcinoma breast stage I disease estrogen and progesterone receptor positive .  Patient had lumpectomy radiation therapy.  Now on letrozole.  Tolerating treatment very well without any significant aches and pains and bony pains. Taking calcium and vitamin D  REVIEW OF SYSTEMS:   GENERAL:  Feels good.  Active.  No fevers, sweats or weight loss. PERFORMANCE STATUS (ECOG):  01 HEENT:  No visual changes, runny nose, sore throat, mouth sores or tenderness. Lungs: No shortness of breath or cough.  No hemoptysis. Cardiac:  No chest pain, palpitations, orthopnea, or PND. GI:  No nausea, vomiting, diarrhea, constipation, melena or hematochezia. GU:  No urgency, frequency, dysuria, or hematuria. Musculoskeletal:  No back pain.  No joint pain.  No muscle tenderness. Extremities:  No pain or swelling. Skin:  No rashes or skin changes. Neuro:  No headache, numbness or weakness, balance or coordination issues. Endocrine:  No diabetes, thyroid issues, hot flashes or night sweats. Psych:  No mood changes, depression or anxiety. Pain:  No focal pain. Review of systems:  All other systems reviewed and found to be negative.  As per HPI. Otherwise, a complete  review of systems is negatve.  PAST MEDICAL HISTORY: Past Medical History  Diagnosis Date  . Diverticulosis   . GERD (gastroesophageal reflux disease)   . Esophageal stricture   . Depression   . Anxiety   . Barrett's esophagus   . IBS (irritable bowel syndrome)   . Adenomatous colon polyp   . Diabetes mellitus   . Hypertension   . Hyperlipemia   . Allergy   . Arthritis     HANDS/FEET  . Urinary incontinence   . Fibrocystic breast disease   . Chronic headache   . Cancer of breast 12/24/2014    PAST SURGICAL HISTORY: Past Surgical History  Procedure Laterality Date  . Dilation and curettage of uterus    . Abdominal hysterectomy    . Cataract extraction      bilateral   . Carpal tunnel release      bilateral  . Finger cyst removal    . Neck cyst removal    . Upper gastrointestinal endoscopy    . Colonoscopy      FAMILY HISTORY Family History  Problem Relation Age of Onset  . Colon cancer Father   . Thyroid disease Father   . Thyroid disease Mother   . Hypertension Mother   . Breast cancer Maternal Grandmother     GYNECOLOGIC HISTORY:  No LMP recorded. Patient has had a hysterectomy.     ADVANCED DIRECTIVES: Patient does have advanced health care directive   HEALTH MAINTENANCE: History  Substance Use Topics  . Smoking status: Never Smoker   . Smokeless  tobacco: Never Used  . Alcohol Use: Yes     Comment: occasional     Allergies  Allergen Reactions  . Avapro [Irbesartan] Other (See Comments)    unknown  . Azithromycin Other (See Comments)    Extreme vaginal burning. unknown  . Clindamycin Other (See Comments)    Vaginal burning  . Duloxetine Hcl Other (See Comments)    Hyperactivity.  . Lipitor [Atorvastatin] Other (See Comments)    "muscle aches"  . Metronidazole Other (See Comments)  . Penicillins   . Prednisone Other (See Comments)    Dizziness/double vision  . Procaine Other (See Comments)    tremors  . Procaine Hcl     tremors  .  Eggs Or Egg-Derived Products Other (See Comments)    Other Reaction: Not Assessed  . Other Other (See Comments) and Anxiety    Novacaine weakness  . Statins Rash    Arthralgias.  . Sulfa Antibiotics Rash    Vague history of a sulfa allergy, but does not remember the reaction.  . Tetracycline Rash    Current Outpatient Prescriptions  Medication Sig Dispense Refill  . enalapril (VASOTEC) 10 MG tablet Take 10 mg by mouth 2 (two) times daily.    . Grape Seed 50 MG CAPS Take 2 capsules by mouth daily.    . hydrochlorothiazide (HYDRODIURIL) 25 MG tablet Take 25 mg by mouth daily.    . L-METHYLFOLATE-ALGAE PO Take 5 capsules by mouth daily.    . metFORMIN (GLUCOPHAGE) 500 MG tablet Take 500 mg by mouth daily with breakfast.    . nystatin cream (MYCOSTATIN) Apply 1 application topically daily as needed for dry skin.    . pantoprazole (PROTONIX) 40 MG tablet Take 40 mg by mouth daily.    Marland Kitchen PARoxetine (PAXIL) 10 MG tablet Take 10 mg by mouth daily.    . Red Yeast Rice 600 MG CAPS Take 2 capsules by mouth daily.    . calcium-vitamin D (OSCAL WITH D) 500-200 MG-UNIT per tablet Take 1 tablet by mouth 2 (two) times daily. 60 tablet 6  . cetirizine (ZYRTEC) 10 MG tablet Take 10 mg by mouth daily.    . clidinium-chlordiazePOXIDE (LIBRAX) 2.5-5 MG per capsule Take 1 capsule by mouth 2 (two) times daily. 60 capsule 2  . colestipol (COLESTID) 1 G tablet Take 2 tablets by mouth daily (Patient not taking: Reported on 12/26/2014) 60 tablet 2  . dicyclomine (BENTYL) 20 MG tablet Take 20 mg by mouth 2 (two) times daily.    Marland Kitchen ibuprofen (ADVIL,MOTRIN) 200 MG tablet Take 200 mg by mouth every 8 (eight) hours as needed.    . indomethacin (INDOCIN) 25 MG capsule Take 25 mg by mouth 3 (three) times daily as needed.    Marland Kitchen letrozole (FEMARA) 2.5 MG tablet Take 1 tablet (2.5 mg total) by mouth daily. 30 tablet 6  . Loperamide HCl (ANTI-DIARRHEAL PO) Take by mouth as needed.    . Misc Natural Products (ESSIAC TONIC PO)  Take 1 Dose by mouth 2 (two) times daily. Mixed with 3 oz of distilled water.    . Multiple Vitamins-Minerals (CENTRUM SILVER PO) Take 1 tablet by mouth daily.    Marland Kitchen nystatin-triamcinolone (MYCOLOG II) cream Apply 1 application topically daily.    Marland Kitchen omeprazole (PRILOSEC) 20 MG capsule Take 20 mg by mouth daily.    Marland Kitchen omeprazole (PRILOSEC) 20 MG capsule Take 1 capsule (20 mg total) by mouth 2 (two) times daily. 60 capsule 3  . OVER THE COUNTER  MEDICATION Take 1 drop by mouth daily. nes grops (7 different types)    . rosuvastatin (CRESTOR) 10 MG tablet Take 10 mg by mouth as directed.     No current facility-administered medications for this visit.    OBJECTIVE:  Filed Vitals:   12/26/14 1450  BP: 135/83  Pulse: 90  Temp: 96.1 F (35.6 C)     Body mass index is 31.95 kg/(m^2).    ECOG FS:1 - Symptomatic but completely ambulatory  PHYSICAL EXAM: GENERAL:  Well developed, well nourished, sitting comfortably in the exam room in no acute distress. MENTAL STATUS:  Alert and oriented to person, place and time. HEAD:   Normocephalic, atraumatic, face symmetric, no Cushingoid features. EYES:  .  Pupils equal round and reactive to light and accomodation.  No conjunctivitis or scleral icterus. ENT:  Oropharynx clear without lesion.  Tongue normal. Mucous membranes moist.  RESPIRATORY:  Clear to auscultation without rales, wheezes or rhonchi. CARDIOVASCULAR:  Regular rate and rhythm without murmur, rub or gallop. BREAST:  Right breast without masses, skin changes or nipple discharge.  Left breast without masses, skin changes or nipple discharge. ABDOMEN:  Soft, non-tender, with active bowel sounds, and no hepatosplenomegaly.  No masses. BACK:  No CVA tenderness.  No tenderness on percussion of the back or rib cage. SKIN:  No rashes, ulcers or lesions. EXTREMITIES: No edema, no skin discoloration or tenderness.  No palpable cords. LYMPH NODES: No palpable cervical, supraclavicular, axillary or  inguinal adenopathy  NEUROLOGICAL: Unremarkable. PSYCH:  Appropriate.   LAB RESULTS:  No visits with results within 3 Day(s) from this visit. Latest known visit with results is:  Appointment on 12/23/2014  Component Date Value Ref Range Status  . WBC 12/23/2014 5.6  3.6 - 11.0 K/uL Final  . RBC 12/23/2014 4.29  3.80 - 5.20 MIL/uL Final  . Hemoglobin 12/23/2014 13.1  12.0 - 16.0 g/dL Final  . HCT 12/23/2014 39.2  35.0 - 47.0 % Final  . MCV 12/23/2014 91.5  80.0 - 100.0 fL Final  . MCH 12/23/2014 30.5  26.0 - 34.0 pg Final  . MCHC 12/23/2014 33.3  32.0 - 36.0 g/dL Final  . RDW 12/23/2014 14.7* 11.5 - 14.5 % Final  . Platelets 12/23/2014 221  150 - 440 K/uL Final      STUDIES: No results found.  ASSESSMENT: 81 year old lady with history of carcinoma breast stage I disease on letrozole therapy tolerating very well  MEDICAL DECISION MAKING:  Lab data has been reviewed.  There is no evidence of recurrent or progressive disease. She  is continuing letrozole therapy  Patient expressed understanding and was in agreement with this plan. She also understands that She can call clinic at any time with any questions, concerns, or complaints.    Cancer of breast   Staging form: Breast, AJCC 7th Edition     Clinical: Stage IA (T1c, N0, M0) - Signed by Forest Gleason, MD on 12/24/2014   Forest Gleason, MD   12/31/2014 9:44 AM

## 2015-01-26 ENCOUNTER — Inpatient Hospital Stay: Payer: Commercial Managed Care - HMO | Attending: Oncology

## 2015-01-26 ENCOUNTER — Inpatient Hospital Stay (HOSPITAL_BASED_OUTPATIENT_CLINIC_OR_DEPARTMENT_OTHER): Payer: Commercial Managed Care - HMO | Admitting: Oncology

## 2015-01-26 ENCOUNTER — Other Ambulatory Visit: Payer: Self-pay | Admitting: *Deleted

## 2015-01-26 VITALS — BP 141/77 | HR 94 | Temp 98.1°F | Wt 174.2 lb

## 2015-01-26 DIAGNOSIS — Z170 Estrogen receptor positive status [ER+]: Secondary | ICD-10-CM | POA: Insufficient documentation

## 2015-01-26 DIAGNOSIS — R110 Nausea: Secondary | ICD-10-CM

## 2015-01-26 DIAGNOSIS — Z79811 Long term (current) use of aromatase inhibitors: Secondary | ICD-10-CM | POA: Diagnosis not present

## 2015-01-26 DIAGNOSIS — C50211 Malignant neoplasm of upper-inner quadrant of right female breast: Secondary | ICD-10-CM | POA: Diagnosis not present

## 2015-01-26 DIAGNOSIS — C50919 Malignant neoplasm of unspecified site of unspecified female breast: Secondary | ICD-10-CM

## 2015-01-26 DIAGNOSIS — R42 Dizziness and giddiness: Secondary | ICD-10-CM

## 2015-01-26 LAB — COMPREHENSIVE METABOLIC PANEL
ALBUMIN: 4.1 g/dL (ref 3.5–5.0)
ALT: 29 U/L (ref 14–54)
AST: 29 U/L (ref 15–41)
Alkaline Phosphatase: 75 U/L (ref 38–126)
Anion gap: 8 (ref 5–15)
BILIRUBIN TOTAL: 0.7 mg/dL (ref 0.3–1.2)
BUN: 16 mg/dL (ref 6–20)
CO2: 25 mmol/L (ref 22–32)
CREATININE: 0.77 mg/dL (ref 0.44–1.00)
Calcium: 9.3 mg/dL (ref 8.9–10.3)
Chloride: 92 mmol/L — ABNORMAL LOW (ref 101–111)
GFR calc Af Amer: >60 mL/min (ref >60)
GFR calc non Af Amer: >60 mL/min (ref >60)
GLUCOSE: 108 mg/dL — AB (ref 65–99)
Potassium: 3.6 mmol/L (ref 3.5–5.1)
SODIUM: 125 mmol/L — AB (ref 135–145)
Total Protein: 7.1 g/dL (ref 6.5–8.1)

## 2015-01-26 LAB — CBC WITH DIFFERENTIAL/PLATELET
BASOS ABS: 0 10*3/uL (ref 0–0.1)
Basophils Relative: 1 %
EOS ABS: 0.1 10*3/uL (ref 0–0.7)
Eosinophils Relative: 1 %
HEMATOCRIT: 42.6 % (ref 35.0–47.0)
Hemoglobin: 14.2 g/dL (ref 12.0–16.0)
Lymphocytes Relative: 13 %
Lymphs Abs: 0.9 10*3/uL — ABNORMAL LOW (ref 1.0–3.6)
MCH: 30.1 pg (ref 26.0–34.0)
MCHC: 33.3 g/dL (ref 32.0–36.0)
MCV: 90.6 fL (ref 80.0–100.0)
Monocytes Absolute: 0.7 10*3/uL (ref 0.2–0.9)
Monocytes Relative: 10 %
Neutro Abs: 5.1 10*3/uL (ref 1.4–6.5)
Neutrophils Relative %: 75 %
Platelets: 260 10*3/uL (ref 150–440)
RBC: 4.71 MIL/uL (ref 3.80–5.20)
RDW: 14.2 % (ref 11.5–14.5)
WBC: 6.8 10*3/uL (ref 3.6–11.0)

## 2015-01-26 NOTE — Progress Notes (Signed)
Patient does not have living will.  Never smoked. 

## 2015-02-02 ENCOUNTER — Telehealth: Payer: Self-pay | Admitting: *Deleted

## 2015-02-02 DIAGNOSIS — C50919 Malignant neoplasm of unspecified site of unspecified female breast: Secondary | ICD-10-CM

## 2015-02-02 MED ORDER — ONDANSETRON HCL 4 MG PO TABS: 4.0000 mg | ORAL_TABLET | Freq: Four times a day (QID) | ORAL | Status: DC | PRN

## 2015-02-02 MED ORDER — CITALOPRAM HYDROBROMIDE 20 MG PO TABS: 20.0000 mg | ORAL_TABLET | Freq: Every day | ORAL | Status: DC

## 2015-02-02 NOTE — Telephone Encounter (Signed)
Spoke with patient who states she continues to be nauseated. States she feels gassy and bloated.  She is not throwing up. Balance has improved.  Headache not as frequent or severe. Continues to have hot flashes and night sweats. Having a hard time eating but is supplementing meals with Boost.  States there is a lot going on in the family, husband has prostate cancer and her daughter is in the middle of a crisis.  Not sure if all of her problems is the medication and wonders if some of it might be nerves.  Wants to know if MD will call her in something for her nausea/nerves.

## 2015-02-02 NOTE — Telephone Encounter (Signed)
Rx for celexa and ondansetron escribed to [facility].

## 2015-02-02 NOTE — Telephone Encounter (Signed)
Called patient to inform her that MD has sent prescription to [facility] for Celexa and Zofran. Patient verbalized understanding.

## 2015-02-03 ENCOUNTER — Encounter: Payer: Self-pay | Admitting: Radiation Oncology

## 2015-02-03 ENCOUNTER — Ambulatory Visit
Admission: RE | Admit: 2015-02-03 | Discharge: 2015-02-03 | Disposition: A | Discharge status: Home / Self Care | Payer: Commercial Managed Care - HMO | Source: Ambulatory Visit | Attending: Radiation Oncology | Admitting: Radiation Oncology

## 2015-02-03 VITALS — BP 137/69 | HR 101 | Temp 96.3°F | Resp 18 | Wt 172.0 lb

## 2015-02-03 DIAGNOSIS — C50911 Malignant neoplasm of unspecified site of right female breast: Secondary | ICD-10-CM

## 2015-02-03 NOTE — Progress Notes (Signed)
Survivorship visit- Survivorship visit completed. Survivorship Care plan given and reviewed with patients. [facility] answers to Survivorship Care booklet given and reviewed with patient. Resources given about CARE program and Cancer Transitions. Patient verbalized understanding. 

## 2015-02-03 NOTE — Progress Notes (Signed)
Radiation Oncology Follow up Note  Name: Paige Dougherty   Date:   02/03/2015 MRN:  [id number] DOB: 1933-01-03    This 81 y.o. female presents to the clinic today for follow-up of breast cancer stage I ER/PR positive HER-2/neu negative now out 1 month having completed radiation therapy to her right breast..  REFERRING PROVIDER: Rocco Serene, MD  HPI: Patient is a 81 year old female now 1 month out having completed whole breast radiation to her right breast for a T1c NX M0 invasive mammary carcinoma. Tumor was ER/PR positive HER-2/neu not overexpressed.. She has been started on letrozole although it caused considerable GI upset as well as fatigue and multiple somatic complaints. She states she is under significant stress. Medical oncology's recently put on antianxiety medication as well as anti-medic therapy. She specifically denies breast tenderness cough or bone pain.  COMPLICATIONS OF TREATMENT: none  FOLLOW UP COMPLIANCE: keeps appointments   PHYSICAL EXAM:  BP 137/69 mmHg  Pulse 101  Temp(Src) 96.3 F (35.7 C)  Resp 18  Wt 171 lb 15.3 oz (78 kg) Lungs are clear to A&P cardiac examination essentially unremarkable with regular rate and rhythm. No dominant mass or nodularity is noted in either breast in 2 positions examined. Incision is well-healed. No axillary or supraclavicular adenopathy is appreciated. Cosmetic result is excellent. Well-developed well-nourished patient in NAD. HEENT reveals PERLA, EOMI, discs not visualized.  Oral cavity is clear. No oral mucosal lesions are identified. Neck is clear without evidence of cervical or supraclavicular adenopathy. Lungs are clear to A&P. Cardiac examination is essentially unremarkable with regular rate and rhythm without murmur rub or thrill. Abdomen is benign with no organomegaly or masses noted. Motor sensory and DTR levels are equal and symmetric in the upper and lower extremities. Cranial nerves II through XII are grossly intact.  Proprioception is intact. No peripheral adenopathy or edema is identified. No motor or sensory levels are noted. Crude visual fields are within normal range.   RADIOLOGY RESULTS: No recent films for review are available  PLAN: At the present time from breast standpoint she is doing well recovering nicely from her whole breast radiation. She has multiple somatic complaints related to her stress levels as well as family concerns. I've asked her to take all medications prescribed by medical oncologist. She will be giving feedback to medical oncology since she has stopped her letrozole at this point. I've otherwise asked to see her back in 4-5 months for follow-up. Patient knows to call with any concerns.  I would like to take this opportunity for allowing me to participate in the care of your patient.Armstead Peaks., MD

## 2015-02-06 ENCOUNTER — Encounter: Payer: Self-pay | Admitting: Oncology

## 2015-02-06 NOTE — Progress Notes (Signed)
[place] @ [facility] Telephone:(336) [phone]  Fax:(336) [place]: 10/30/1932  MR#: [id number]  [id number]#:[id number]  Patient Care Team: Derinda Late, MD as PCP - General (Family Medicine)  CHIEF COMPLAINT:  Chief Complaint  Patient presents with  . 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 cT1c (1.9 x 1.6 cm mass) Nx cM0, grade 2. ER positive (>90%), PR positive (>90%), HER2/neu negative (1+ on IHC). On letrozole had radiation therapy     Cancer of breast   09/23/2014 Initial Diagnosis Cancer of breast    No flowsheet data found.  INTERVAL HISTORY:  81 year old lady with a history of carcinoma breast stage I disease estrogen and progesterone receptor positive .  Patient had lumpectomy radiation therapy.  Now on letrozole.  Tolerating treatment very well without any significant aches and pains and bony pains. Taking calcium and vitamin D January 26, 2015 Patient came with extreme anxiety number of complaints not able to sleep bony pains and joint pains.  Not sure whether all started with taking letrozole or 11 where there before letrozole was started. REVIEW OF SYSTEMS:   GENERAL: Stream he apprehensive and depressed lady PERFORMANCE STATUS (ECOG):  01 HEENT:  No visual changes, runny nose, sore throat, mouth sores or tenderness. Lungs: No shortness of breath or cough.  No hemoptysis. Cardiac:  No chest pain, palpitations, orthopnea, or PND. GI:  No nausea, vomiting, diarrhea, constipation, melena or hematochezia. GU:  No urgency, frequency, dysuria, or hematuria. Musculoskeletal: Joint pains.  Back pain.  His muscle tenderness. Extremities:  No pain or swelling. Skin:  No rashes or skin changes. Neuro:  No headache, numbness or weakness, balance or coordination issues. Endocrine:  No diabetes, thyroid issues, hot flashes or night  sweats. Psych:  Very anxious lady not in any acute distress Pain: Mild joint pains and back pain Review of systems:  All other systems reviewed and found to be negative.  As per HPI. Otherwise, a complete review of systems is negatve.  PAST MEDICAL HISTORY: Past Medical History  Diagnosis Date  . Diverticulosis   . GERD (gastroesophageal reflux disease)   . Esophageal stricture   . Depression   . Anxiety   . Barrett's esophagus   . IBS (irritable bowel syndrome)   . Adenomatous colon polyp   . Diabetes mellitus   . Hypertension   . Hyperlipemia   . Allergy   . Arthritis     HANDS/FEET  . Urinary incontinence   . Fibrocystic breast disease   . Chronic headache   . Cancer of breast 12/24/2014    PAST SURGICAL HISTORY: Past Surgical History  Procedure Laterality Date  . Dilation and curettage of uterus    . Abdominal hysterectomy    . Cataract extraction      bilateral   . Carpal tunnel release      bilateral  . Finger cyst removal    . Neck cyst removal    . Upper gastrointestinal endoscopy    . Colonoscopy      FAMILY HISTORY Family History  Problem Relation Age of Onset  . Colon cancer Father   . Thyroid disease Father   . Thyroid disease Mother   . Hypertension Mother   . Breast cancer Maternal Grandmother         ADVANCED DIRECTIVES: Patient does have advanced health care directive  HEALTH MAINTENANCE: History  Substance Use Topics  . Smoking status: Never Smoker   . Smokeless tobacco: Never Used  . Alcohol Use: Yes     Comment: occasional     Allergies  Allergen Reactions  . Avapro [Irbesartan] Other (See Comments)    unknown  . Azithromycin Other (See Comments)    Extreme vaginal burning. unknown  . Clindamycin Other (See Comments)    Vaginal burning  . Duloxetine Hcl Other (See Comments)    Hyperactivity.  . Lipitor [Atorvastatin] Other (See Comments)    "muscle aches"  . Metronidazole Other (See Comments)  . Penicillins   .  Prednisone Other (See Comments)    Dizziness/double vision  . Procaine Other (See Comments)    tremors  . Procaine Hcl     tremors  . Eggs Or Egg-Derived Products Other (See Comments)    Other Reaction: Not Assessed  . Other Other (See Comments) and Anxiety    Novacaine weakness  . Statins Rash    Arthralgias.  . Sulfa Antibiotics Rash    Vague history of a sulfa allergy, but does not remember the reaction.  . Tetracycline Rash    Current Outpatient Prescriptions  Medication Sig Dispense Refill  . calcium-vitamin D (OSCAL WITH D) 500-200 MG-UNIT per tablet Take 1 tablet by mouth 2 (two) times daily. 60 tablet 6  . enalapril (VASOTEC) 10 MG tablet Take 10 mg by mouth 2 (two) times daily.    . Grape Seed 50 MG CAPS Take 2 capsules by mouth daily.    . hydrochlorothiazide (HYDRODIURIL) 25 MG tablet Take 25 mg by mouth daily.    . indomethacin (INDOCIN) 25 MG capsule Take 25 mg by mouth 3 (three) times daily as needed.    . L-METHYLFOLATE-ALGAE PO Take 5 capsules by mouth daily.    Marland Kitchen letrozole (FEMARA) 2.5 MG tablet Take 1 tablet (2.5 mg total) by mouth daily. (Patient not taking: Reported on 02/03/2015) 30 tablet 6  . Loperamide HCl (ANTI-DIARRHEAL PO) Take by mouth as needed.    . metFORMIN (GLUCOPHAGE) 500 MG tablet Take 500 mg by mouth daily with breakfast.    . Multiple Vitamins-Minerals (CENTRUM SILVER PO) Take 1 tablet by mouth daily.    Marland Kitchen nystatin cream (MYCOSTATIN) Apply 1 application topically daily as needed for dry skin.    Marland Kitchen OVER THE COUNTER MEDICATION Take 1 drop by mouth daily. nes grops (7 different types)    . pantoprazole (PROTONIX) 40 MG tablet Take 40 mg by mouth daily.    Marland Kitchen PARoxetine (PAXIL) 10 MG tablet Take 10 mg by mouth daily.    . Red Yeast Rice 600 MG CAPS Take 2 capsules by mouth daily.    Marland Kitchen acetaminophen (TYLENOL) 500 MG tablet Take by mouth.    . cetirizine (ZYRTEC) 10 MG tablet Take 10 mg by mouth daily.    . citalopram (CELEXA) 20 MG tablet Take 1  tablet (20 mg total) by mouth daily. 30 tablet 3  . clidinium-chlordiazePOXIDE (LIBRAX) 2.5-5 MG per capsule Take 1 capsule by mouth 2 (two) times daily. 60 capsule 2  . colestipol (COLESTID) 1 G tablet Take 2 tablets by mouth daily (Patient not taking: Reported on 12/26/2014) 60 tablet 2  . dicyclomine (BENTYL) 20 MG tablet Take 20 mg by mouth 2 (two) times daily.    Marland Kitchen ibuprofen (ADVIL,MOTRIN) 200 MG tablet Take 200 mg by mouth every 8 (eight) hours as needed.    . Misc Natural Products (ESSIAC TONIC PO)  Take 1 Dose by mouth 2 (two) times daily. Mixed with 3 oz of distilled water.    . nystatin-triamcinolone (MYCOLOG II) cream Apply 1 application topically daily.    Marland Kitchen omeprazole (PRILOSEC) 20 MG capsule Take 20 mg by mouth daily.    Marland Kitchen omeprazole (PRILOSEC) 20 MG capsule Take 1 capsule (20 mg total) by mouth 2 (two) times daily. 60 capsule 3  . ondansetron (ZOFRAN) 4 MG tablet Take 1 tablet (4 mg total) by mouth every 6 (six) hours as needed for nausea or vomiting. 20 tablet 3  . rosuvastatin (CRESTOR) 10 MG tablet Take 10 mg by mouth as directed.     No current facility-administered medications for this visit.    OBJECTIVE:  Filed Vitals:   01/26/15 1509  BP: 141/77  Pulse: 94  Temp: 98.1 F (36.7 C)     Body mass index is 30.86 kg/(m^2).    ECOG FS:1 - Symptomatic but completely ambulatory  PHYSICAL EXAM: GENERAL:  Well developed, well nourished, sitting comfortably in the exam room in no acute distress. MENTAL STATUS:  Alert and oriented to person, place and time. HEAD:   Normocephalic, atraumatic, face symmetric, no Cushingoid features. EYES:  .  Pupils equal round and reactive to light and accomodation.  No conjunctivitis or scleral icterus. ENT:  Oropharynx clear without lesion.  Tongue normal. Mucous membranes moist.  RESPIRATORY:  Clear to auscultation without rales, wheezes or rhonchi. CARDIOVASCULAR:  Regular rate and rhythm without murmur, rub or gallop. BREAST:  Right  breast without masses, skin changes or nipple discharge.  Left breast without masses, skin changes or nipple discharge. ABDOMEN:  Soft, non-tender, with active bowel sounds, and no hepatosplenomegaly.  No masses. BACK:  No CVA tenderness.  No tenderness on percussion of the back or rib cage. SKIN:  No rashes, ulcers or lesions. EXTREMITIES: No edema, no skin discoloration or tenderness.  No palpable cords. LYMPH NODES: No palpable cervical, supraclavicular, axillary or inguinal adenopathy  NEUROLOGICAL: Unremarkable. PSYCH:  Appropriate.   LAB RESULTS:  Appointment on 01/26/2015  Component Date Value Ref Range Status  . WBC 01/26/2015 6.8  3.6 - 11.0 K/uL Final  . RBC 01/26/2015 4.71  3.80 - 5.20 MIL/uL Final  . Hemoglobin 01/26/2015 14.2  12.0 - 16.0 g/dL Final  . HCT 01/26/2015 42.6  35.0 - 47.0 % Final  . MCV 01/26/2015 90.6  80.0 - 100.0 fL Final  . MCH 01/26/2015 30.1  26.0 - 34.0 pg Final  . MCHC 01/26/2015 33.3  32.0 - 36.0 g/dL Final  . RDW 01/26/2015 14.2  11.5 - 14.5 % Final  . Platelets 01/26/2015 260  150 - 440 K/uL Final  . Neutrophils Relative % 01/26/2015 75   Final  . Neutro Abs 01/26/2015 5.1  1.4 - 6.5 K/uL Final  . Lymphocytes Relative 01/26/2015 13   Final  . Lymphs Abs 01/26/2015 0.9* 1.0 - 3.6 K/uL Final  . Monocytes Relative 01/26/2015 10   Final  . Monocytes Absolute 01/26/2015 0.7  0.2 - 0.9 K/uL Final  . Eosinophils Relative 01/26/2015 1   Final  . Eosinophils Absolute 01/26/2015 0.1  0 - 0.7 K/uL Final  . Basophils Relative 01/26/2015 1   Final  . Basophils Absolute 01/26/2015 0.0  0 - 0.1 K/uL Final  . Sodium 01/26/2015 125* 135 - 145 mmol/L Final  . Potassium 01/26/2015 3.6  3.5 - 5.1 mmol/L Final  . Chloride 01/26/2015 92* 101 - 111 mmol/L Final  . CO2 01/26/2015 25  22 - 32 mmol/L Final  . Glucose, Bld 01/26/2015 108* 65 - 99 mg/dL Final  . BUN 01/26/2015 16  6 - 20 mg/dL Final  . Creatinine, Ser 01/26/2015 0.77  0.44 - 1.00 mg/dL Final  .  Calcium 01/26/2015 9.3  8.9 - 10.3 mg/dL Final  . Total Protein 01/26/2015 7.1  6.5 - 8.1 g/dL Final  . Albumin 01/26/2015 4.1  3.5 - 5.0 g/dL Final  . AST 01/26/2015 29  15 - 41 U/L Final  . ALT 01/26/2015 29  14 - 54 U/L Final  . Alkaline Phosphatase 01/26/2015 75  38 - 126 U/L Final  . Total Bilirubin 01/26/2015 0.7  0.3 - 1.2 mg/dL Final  . GFR calc non Af Amer 01/26/2015 >60  >60 mL/min Final  . GFR calc Af Amer 01/26/2015 >60  >60 mL/min Final   Comment: (NOTE) The eGFR has been calculated using the CKD EPI equation. This calculation has not been validated in all clinical situations. eGFR's persistently <60 mL/min signify possible Chronic Kidney Disease.   . Anion gap 01/26/2015 8  5 - 15 Final      STUDIES: No results found.  ASSESSMENT: 81 year old lady with history of carcinoma breast stage I disease on letrozole therapy tolerating very well  MEDICAL DECISION MAKING:  Patient has number of complaints which were reviewed.  Not sure whether any of those was secondary to letrozole therapy or not but we decided to stop letrozole at present time.  Possibility of antidepressant medication if there is no significant improvement was also considered.  Patient expressed understanding and was in agreement with this plan. She also understands that She can call clinic at any time with any questions, concerns, or complaints.    Cancer of breast   Staging form: Breast, AJCC 7th Edition     Clinical: Stage IA (T1c, N0, M0) - Signed by Forest Gleason, MD on 12/24/2014   Forest Gleason, MD   02/06/2015 7:49 PM

## 2015-02-20 ENCOUNTER — Telehealth: Payer: Self-pay | Admitting: *Deleted

## 2015-02-20 NOTE — Telephone Encounter (Signed)
Per Dr Oliva Bustard, patient needs psychiatrist, I have a call placed to husband to see who he wants her to see. I have left a message for him to return my call

## 2015-02-20 NOTE — Telephone Encounter (Signed)
States this has happened since completing tx for breast cancer and starting on AI

## 2015-02-21 NOTE — Telephone Encounter (Signed)
I have called for Paige Dougherty multiple times since yesterday and keep leaving messages for him to return my call, but I have not heard from him. I cannot speak with daughter due to HIPPA

## 2015-02-21 NOTE — Telephone Encounter (Signed)
Mr Etherington called back and said that Ms Lorenzi saw Dr Zola Button this morning who changed her med adn he feels she is doning better right now and does not need to see a psychiatrist. He said he would talk with the daughter

## 2015-02-27 ENCOUNTER — Inpatient Hospital Stay: Payer: Commercial Managed Care - HMO | Attending: Oncology | Admitting: Oncology

## 2015-02-27 VITALS — BP 165/80 | HR 108 | Temp 97.1°F | Wt 170.0 lb

## 2015-02-27 DIAGNOSIS — C50919 Malignant neoplasm of unspecified site of unspecified female breast: Secondary | ICD-10-CM

## 2015-02-27 DIAGNOSIS — K589 Irritable bowel syndrome without diarrhea: Secondary | ICD-10-CM | POA: Diagnosis not present

## 2015-02-27 DIAGNOSIS — E119 Type 2 diabetes mellitus without complications: Secondary | ICD-10-CM | POA: Insufficient documentation

## 2015-02-27 DIAGNOSIS — K219 Gastro-esophageal reflux disease without esophagitis: Secondary | ICD-10-CM | POA: Insufficient documentation

## 2015-02-27 DIAGNOSIS — I10 Essential (primary) hypertension: Secondary | ICD-10-CM | POA: Insufficient documentation

## 2015-02-27 DIAGNOSIS — C50211 Malignant neoplasm of upper-inner quadrant of right female breast: Secondary | ICD-10-CM | POA: Diagnosis not present

## 2015-02-27 DIAGNOSIS — Z170 Estrogen receptor positive status [ER+]: Secondary | ICD-10-CM | POA: Diagnosis not present

## 2015-02-27 DIAGNOSIS — Z79899 Other long term (current) drug therapy: Secondary | ICD-10-CM | POA: Insufficient documentation

## 2015-02-27 DIAGNOSIS — E785 Hyperlipidemia, unspecified: Secondary | ICD-10-CM | POA: Diagnosis not present

## 2015-02-27 DIAGNOSIS — F329 Major depressive disorder, single episode, unspecified: Secondary | ICD-10-CM | POA: Insufficient documentation

## 2015-02-27 DIAGNOSIS — Z79811 Long term (current) use of aromatase inhibitors: Secondary | ICD-10-CM | POA: Insufficient documentation

## 2015-02-27 DIAGNOSIS — Z923 Personal history of irradiation: Secondary | ICD-10-CM | POA: Diagnosis not present

## 2015-02-27 NOTE — Progress Notes (Signed)
Patient does not have living will.  Never smoked. 

## 2015-03-04 ENCOUNTER — Encounter: Payer: Self-pay | Admitting: Oncology

## 2015-03-04 NOTE — Progress Notes (Signed)
[place] @ [facility] Telephone:(336) [phone]  Fax:(336) [place]: April 23, 1933  MR#: [id number]  ZHY#:[id number]  Patient Care Team: Derinda Late, MD as PCP - General (Family Medicine)  CHIEF COMPLAINT:  Chief Complaint  Patient presents with  . 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 cT1c (1.9 x 1.6 cm mass) Nx cM0, grade 2. ER positive (>90%), PR positive (>90%), HER2/neu negative (1+ on IHC). On letrozole had radiation therapy     Cancer of breast   09/23/2014 Initial Diagnosis Cancer of breast    INTERVAL HISTORY:  81 year old lady with a history of carcinoma breast stage I disease estrogen and progesterone receptor positive .  Patient had lumpectomy radiation therapy.  Now on letrozole.  Tolerating treatment very well without any significant aches and pains and bony pains. Taking calcium and vitamin D January 26, 2015 Patient came with extreme anxiety number of complaints not able to sleep bony pains and joint pains.  Not sure whether all started with taking letrozole or 11 where there before letrozole was started. July, 2016 we received several phone calls about severe anxiety not able to sleep.  His family physician is started Paxil without much relief.  Patient is off anti-hormonal therapy.  REVIEW OF SYSTEMS:   GENERAL: Stream he apprehensive and depressed lady PERFORMANCE STATUS (ECOG):  01 HEENT:  No visual changes, runny nose, sore throat, mouth sores or tenderness. Lungs: No shortness of breath or cough.  No hemoptysis. Cardiac:  No chest pain, palpitations, orthopnea, or PND. GI:  No nausea, vomiting, diarrhea, constipation, melena or hematochezia. GU:  No urgency, frequency, dysuria, or hematuria. Musculoskeletal: Joint pains.  Back pain.  His muscle tenderness. Extremities:  No pain or swelling. Skin:  No rashes  or skin changes. Neuro:  No headache, numbness or weakness, balance or coordination issues. Endocrine:  No diabetes, thyroid issues, hot flashes or night sweats. Psych:  Very anxious lady not in any acute distress Pain: Mild joint pains and back pain Review of systems:  All other systems reviewed and found to be negative.  As per HPI. Otherwise, a complete review of systems is negatve.  PAST MEDICAL HISTORY: Past Medical History  Diagnosis Date  . Diverticulosis   . GERD (gastroesophageal reflux disease)   . Esophageal stricture   . Depression   . Anxiety   . Barrett's esophagus   . IBS (irritable bowel syndrome)   . Adenomatous colon polyp   . Diabetes mellitus   . Hypertension   . Hyperlipemia   . Allergy   . Arthritis     HANDS/FEET  . Urinary incontinence   . Fibrocystic breast disease   . Chronic headache   . Cancer of breast 12/24/2014    PAST SURGICAL HISTORY: Past Surgical History  Procedure Laterality Date  . Dilation and curettage of uterus    . Abdominal hysterectomy    . Cataract extraction      bilateral   . Carpal tunnel release      bilateral  . Finger cyst removal    . Neck cyst removal    . Upper gastrointestinal endoscopy    . Colonoscopy      FAMILY HISTORY Family History  Problem Relation Age of Onset  . Colon cancer Father   . Thyroid disease Father   . Thyroid disease Mother   .  Hypertension Mother   . Breast cancer Maternal Grandmother         ADVANCED DIRECTIVES: Patient does have advanced health care directive   HEALTH MAINTENANCE: History  Substance Use Topics  . Smoking status: Never Smoker   . Smokeless tobacco: Never Used  . Alcohol Use: Yes     Comment: occasional     Allergies  Allergen Reactions  . Avapro [Irbesartan] Other (See Comments)    unknown  . Azithromycin Other (See Comments)    Extreme vaginal burning. unknown  . Clindamycin Other (See Comments)    Vaginal burning  . Duloxetine Hcl Other (See  Comments)    Hyperactivity.  . Lipitor [Atorvastatin] Other (See Comments)    "muscle aches"  . Metronidazole Other (See Comments)  . Penicillins   . Prednisone Other (See Comments)    Dizziness/double vision  . Procaine Other (See Comments)    tremors  . Procaine Hcl     tremors  . Eggs Or Egg-Derived Products Other (See Comments)    Other Reaction: Not Assessed  . Other Other (See Comments) and Anxiety    Novacaine weakness  . Statins Rash    Arthralgias.  . Sulfa Antibiotics Rash    Vague history of a sulfa allergy, but does not remember the reaction.  . Tetracycline Rash    Current Outpatient Prescriptions  Medication Sig Dispense Refill  . acetaminophen (TYLENOL) 500 MG tablet Take by mouth.    . ALPRAZolam (XANAX) 0.5 MG tablet     . calcium-vitamin D (OSCAL WITH D) 500-200 MG-UNIT per tablet Take 1 tablet by mouth 2 (two) times daily. 60 tablet 6  . cetirizine (ZYRTEC) 10 MG tablet Take 10 mg by mouth daily.    . colestipol (COLESTID) 1 G tablet Take 2 tablets by mouth daily 60 tablet 2  . dicyclomine (BENTYL) 20 MG tablet Take 20 mg by mouth 2 (two) times daily.    . enalapril (VASOTEC) 10 MG tablet Take 10 mg by mouth 2 (two) times daily.    . Grape Seed 50 MG CAPS Take 2 capsules by mouth daily.    . hydrochlorothiazide (HYDRODIURIL) 25 MG tablet Take 25 mg by mouth daily.    Marland Kitchen ibuprofen (ADVIL,MOTRIN) 200 MG tablet Take 200 mg by mouth every 8 (eight) hours as needed.    . indomethacin (INDOCIN) 25 MG capsule Take 25 mg by mouth 3 (three) times daily as needed.    . L-METHYLFOLATE-ALGAE PO Take 5 capsules by mouth daily.    . Loperamide HCl (ANTI-DIARRHEAL PO) Take by mouth as needed.    . metFORMIN (GLUCOPHAGE) 500 MG tablet Take 500 mg by mouth daily with breakfast.    . Misc Natural Products (ESSIAC TONIC PO) Take 1 Dose by mouth 2 (two) times daily. Mixed with 3 oz of distilled water.    . Multiple Vitamins-Minerals (CENTRUM SILVER PO) Take 1 tablet by mouth  daily.    Marland Kitchen nystatin cream (MYCOSTATIN) Apply 1 application topically daily as needed for dry skin.    Marland Kitchen nystatin-triamcinolone (MYCOLOG II) cream Apply 1 application topically daily.    . ondansetron (ZOFRAN) 4 MG tablet Take 1 tablet (4 mg total) by mouth every 6 (six) hours as needed for nausea or vomiting. 20 tablet 3  . OVER THE COUNTER MEDICATION Take 1 drop by mouth daily. nes grops (7 different types)    . pantoprazole (PROTONIX) 40 MG tablet Take 40 mg by mouth daily.    Marland Kitchen PARoxetine (PAXIL)  20 MG tablet Take by mouth.    . Red Yeast Rice 600 MG CAPS Take 2 capsules by mouth daily.    . rosuvastatin (CRESTOR) 10 MG tablet Take 10 mg by mouth as directed.    Marland Kitchen ALPRAZolam (XANAX) 0.5 MG tablet Take by mouth.    . clidinium-chlordiazePOXIDE (LIBRAX) 2.5-5 MG per capsule Take 1 capsule by mouth 2 (two) times daily. 60 capsule 2  . omeprazole (PRILOSEC) 20 MG capsule Take 1 capsule (20 mg total) by mouth 2 (two) times daily. 60 capsule 3   No current facility-administered medications for this visit.    OBJECTIVE:  Filed Vitals:   02/27/15 1527  BP: 165/80  Pulse: 108  Temp: 97.1 F (36.2 C)     Body mass index is 30.12 kg/(m^2).    ECOG FS:1 - Symptomatic but completely ambulatory  PHYSICAL EXAM: GENERAL:  Well developed, well nourished, sitting comfortably in the exam room in no acute distress. MENTAL STATUS:  Alert and oriented to person, place and time. HEAD:   Normocephalic, atraumatic, face symmetric, no Cushingoid features. EYES:  .  Pupils equal round and reactive to light and accomodation.  No conjunctivitis or scleral icterus. ENT:  Oropharynx clear without lesion.  Tongue normal. Mucous membranes moist.  RESPIRATORY:  Clear to auscultation without rales, wheezes or rhonchi. CARDIOVASCULAR:  Regular rate and rhythm without murmur, rub or gallop. BREAST:  Right breast without masses, skin changes or nipple discharge.  Left breast without masses, skin changes or nipple  discharge. ABDOMEN:  Soft, non-tender, with active bowel sounds, and no hepatosplenomegaly.  No masses. BACK:  No CVA tenderness.  No tenderness on percussion of the back or rib cage. SKIN:  No rashes, ulcers or lesions. EXTREMITIES: No edema, no skin discoloration or tenderness.  No palpable cords. LYMPH NODES: No palpable cervical, supraclavicular, axillary or inguinal adenopathy  NEUROLOGICAL: Unremarkable. PSYCH:  Appropriate.   LAB RESULTS:  No visits with results within 3 Day(s) from this visit. Latest known visit with results is:  Appointment on 01/26/2015  Component Date Value Ref Range Status  . WBC 01/26/2015 6.8  3.6 - 11.0 K/uL Final  . RBC 01/26/2015 4.71  3.80 - 5.20 MIL/uL Final  . Hemoglobin 01/26/2015 14.2  12.0 - 16.0 g/dL Final  . HCT 01/26/2015 42.6  35.0 - 47.0 % Final  . MCV 01/26/2015 90.6  80.0 - 100.0 fL Final  . MCH 01/26/2015 30.1  26.0 - 34.0 pg Final  . MCHC 01/26/2015 33.3  32.0 - 36.0 g/dL Final  . RDW 01/26/2015 14.2  11.5 - 14.5 % Final  . Platelets 01/26/2015 260  150 - 440 K/uL Final  . Neutrophils Relative % 01/26/2015 75   Final  . Neutro Abs 01/26/2015 5.1  1.4 - 6.5 K/uL Final  . Lymphocytes Relative 01/26/2015 13   Final  . Lymphs Abs 01/26/2015 0.9* 1.0 - 3.6 K/uL Final  . Monocytes Relative 01/26/2015 10   Final  . Monocytes Absolute 01/26/2015 0.7  0.2 - 0.9 K/uL Final  . Eosinophils Relative 01/26/2015 1   Final  . Eosinophils Absolute 01/26/2015 0.1  0 - 0.7 K/uL Final  . Basophils Relative 01/26/2015 1   Final  . Basophils Absolute 01/26/2015 0.0  0 - 0.1 K/uL Final  . Sodium 01/26/2015 125* 135 - 145 mmol/L Final  . Potassium 01/26/2015 3.6  3.5 - 5.1 mmol/L Final  . Chloride 01/26/2015 92* 101 - 111 mmol/L Final  . CO2 01/26/2015 25  22 -  32 mmol/L Final  . Glucose, Bld 01/26/2015 108* 65 - 99 mg/dL Final  . BUN 01/26/2015 16  6 - 20 mg/dL Final  . Creatinine, Ser 01/26/2015 0.77  0.44 - 1.00 mg/dL Final  . Calcium 01/26/2015  9.3  8.9 - 10.3 mg/dL Final  . Total Protein 01/26/2015 7.1  6.5 - 8.1 g/dL Final  . Albumin 01/26/2015 4.1  3.5 - 5.0 g/dL Final  . AST 01/26/2015 29  15 - 41 U/L Final  . ALT 01/26/2015 29  14 - 54 U/L Final  . Alkaline Phosphatase 01/26/2015 75  38 - 126 U/L Final  . Total Bilirubin 01/26/2015 0.7  0.3 - 1.2 mg/dL Final  . GFR calc non Af Amer 01/26/2015 >60  >60 mL/min Final  . GFR calc Af Amer 01/26/2015 >60  >60 mL/min Final   Comment: (NOTE) The eGFR has been calculated using the CKD EPI equation. This calculation has not been validated in all clinical situations. eGFR's persistently <60 mL/min signify possible Chronic Kidney Disease.   . Anion gap 01/26/2015 8  5 - 15 Final        ASSESSMENT: 81 year old lady with history of carcinoma breast stage I disease on letrozole therapy tolerating very well Patient's severe anxiety and depression is not related to anti-hormonal therapy patient has been off entire hormonal therapy for last several weeks.  I had detailed discussion with patient and suggested that she needs to get psychiatric help.  Appointment has been made for psychiatric history evaluate the patient  Evaluate patient in 3 months for possibility of starting different entire hormonal therapy after patient's psychiatric symptoms resolved  MEDICAL DECISION MAKING:  Patient has number of complaints which were reviewed.  Not sure whether any of those was secondary to letrozole therapy or not but we decided to stop letrozole at present time.  Possibility of antidepressant medication if there is no significant improvement was also considered.  Patient expressed understanding and was in agreement with this plan. She also understands that She can call clinic at any time with any questions, concerns, or complaints.    Cancer of breast   Staging form: Breast, AJCC 7th Edition     Clinical: Stage IA (T1c, N0, M0) - Signed by Forest Gleason, MD on 12/24/2014   Forest Gleason, MD    03/04/2015 3:55 PM

## 2015-03-05 ENCOUNTER — Inpatient Hospital Stay: Payer: Commercial Managed Care - HMO

## 2015-03-05 ENCOUNTER — Inpatient Hospital Stay
Admission: EM | Admit: 2015-03-05 | Discharge: 2015-03-10 | DRG: 917 | Disposition: A | Discharge status: Other Acute Inpatient Hospital | Payer: Commercial Managed Care - HMO | Attending: Internal Medicine | Admitting: Internal Medicine

## 2015-03-05 ENCOUNTER — Emergency Department: Payer: Commercial Managed Care - HMO

## 2015-03-05 ENCOUNTER — Encounter: Payer: Self-pay | Admitting: Emergency Medicine

## 2015-03-05 DIAGNOSIS — Z4659 Encounter for fitting and adjustment of other gastrointestinal appliance and device: Secondary | ICD-10-CM

## 2015-03-05 DIAGNOSIS — E871 Hypo-osmolality and hyponatremia: Secondary | ICD-10-CM | POA: Diagnosis present

## 2015-03-05 DIAGNOSIS — T424X2A Poisoning by benzodiazepines, intentional self-harm, initial encounter: Secondary | ICD-10-CM

## 2015-03-05 DIAGNOSIS — F329 Major depressive disorder, single episode, unspecified: Secondary | ICD-10-CM | POA: Diagnosis present

## 2015-03-05 DIAGNOSIS — K567 Ileus, unspecified: Secondary | ICD-10-CM | POA: Diagnosis present

## 2015-03-05 DIAGNOSIS — I10 Essential (primary) hypertension: Secondary | ICD-10-CM | POA: Diagnosis present

## 2015-03-05 DIAGNOSIS — T43201A Poisoning by unspecified antidepressants, accidental (unintentional), initial encounter: Secondary | ICD-10-CM | POA: Diagnosis present

## 2015-03-05 DIAGNOSIS — J9601 Acute respiratory failure with hypoxia: Secondary | ICD-10-CM | POA: Diagnosis present

## 2015-03-05 DIAGNOSIS — E785 Hyperlipidemia, unspecified: Secondary | ICD-10-CM | POA: Diagnosis present

## 2015-03-05 DIAGNOSIS — Z853 Personal history of malignant neoplasm of breast: Secondary | ICD-10-CM

## 2015-03-05 DIAGNOSIS — T424X1A Poisoning by benzodiazepines, accidental (unintentional), initial encounter: Secondary | ICD-10-CM | POA: Diagnosis present

## 2015-03-05 DIAGNOSIS — C50919 Malignant neoplasm of unspecified site of unspecified female breast: Secondary | ICD-10-CM | POA: Diagnosis present

## 2015-03-05 DIAGNOSIS — T502X2A Poisoning by carbonic-anhydrase inhibitors, benzothiadiazides and other diuretics, intentional self-harm, initial encounter: Secondary | ICD-10-CM | POA: Diagnosis present

## 2015-03-05 DIAGNOSIS — T40601A Poisoning by unspecified narcotics, accidental (unintentional), initial encounter: Secondary | ICD-10-CM | POA: Diagnosis present

## 2015-03-05 DIAGNOSIS — Z800 Family history of malignant neoplasm of digestive organs: Secondary | ICD-10-CM | POA: Diagnosis not present

## 2015-03-05 DIAGNOSIS — G92 Toxic encephalopathy: Secondary | ICD-10-CM | POA: Diagnosis present

## 2015-03-05 DIAGNOSIS — E119 Type 2 diabetes mellitus without complications: Secondary | ICD-10-CM | POA: Diagnosis present

## 2015-03-05 DIAGNOSIS — T40602A Poisoning by unspecified narcotics, intentional self-harm, initial encounter: Secondary | ICD-10-CM | POA: Diagnosis not present

## 2015-03-05 DIAGNOSIS — T402X2A Poisoning by other opioids, intentional self-harm, initial encounter: Secondary | ICD-10-CM | POA: Diagnosis present

## 2015-03-05 DIAGNOSIS — Z8249 Family history of ischemic heart disease and other diseases of the circulatory system: Secondary | ICD-10-CM | POA: Diagnosis not present

## 2015-03-05 DIAGNOSIS — T43202A Poisoning by unspecified antidepressants, intentional self-harm, initial encounter: Secondary | ICD-10-CM

## 2015-03-05 DIAGNOSIS — F32A Depression, unspecified: Secondary | ICD-10-CM | POA: Diagnosis present

## 2015-03-05 DIAGNOSIS — K2270 Barrett's esophagus without dysplasia: Secondary | ICD-10-CM | POA: Diagnosis present

## 2015-03-05 DIAGNOSIS — R140 Abdominal distension (gaseous): Secondary | ICD-10-CM

## 2015-03-05 DIAGNOSIS — Y92003 Bedroom of unspecified non-institutional (private) residence as the place of occurrence of the external cause: Secondary | ICD-10-CM

## 2015-03-05 DIAGNOSIS — Z803 Family history of malignant neoplasm of breast: Secondary | ICD-10-CM

## 2015-03-05 DIAGNOSIS — M109 Gout, unspecified: Secondary | ICD-10-CM | POA: Diagnosis present

## 2015-03-05 DIAGNOSIS — E876 Hypokalemia: Secondary | ICD-10-CM | POA: Diagnosis present

## 2015-03-05 DIAGNOSIS — T43222A Poisoning by selective serotonin reuptake inhibitors, intentional self-harm, initial encounter: Secondary | ICD-10-CM | POA: Diagnosis present

## 2015-03-05 DIAGNOSIS — F419 Anxiety disorder, unspecified: Secondary | ICD-10-CM | POA: Diagnosis present

## 2015-03-05 DIAGNOSIS — F322 Major depressive disorder, single episode, severe without psychotic features: Secondary | ICD-10-CM | POA: Diagnosis present

## 2015-03-05 DIAGNOSIS — K219 Gastro-esophageal reflux disease without esophagitis: Secondary | ICD-10-CM | POA: Diagnosis present

## 2015-03-05 DIAGNOSIS — T1491XA Suicide attempt, initial encounter: Secondary | ICD-10-CM | POA: Diagnosis present

## 2015-03-05 LAB — COMPREHENSIVE METABOLIC PANEL
ALT: 25 U/L (ref 14–54)
AST: 26 U/L (ref 15–41)
Albumin: 3.4 g/dL — ABNORMAL LOW (ref 3.5–5.0)
Alkaline Phosphatase: 71 U/L (ref 38–126)
Anion gap: 9 (ref 5–15)
BILIRUBIN TOTAL: 0.5 mg/dL (ref 0.3–1.2)
BUN: 13 mg/dL (ref 6–20)
CHLORIDE: 95 mmol/L — AB (ref 101–111)
CO2: 27 mmol/L (ref 22–32)
Calcium: 9.4 mg/dL (ref 8.9–10.3)
Creatinine, Ser: 0.61 mg/dL (ref 0.44–1.00)
GFR calc Af Amer: >60 mL/min (ref >60)
GFR calc non Af Amer: >60 mL/min (ref >60)
Glucose, Bld: 101 mg/dL — ABNORMAL HIGH (ref 65–99)
Potassium: 3.3 mmol/L — ABNORMAL LOW (ref 3.5–5.1)
Sodium: 131 mmol/L — ABNORMAL LOW (ref 135–145)
Total Protein: 6.1 g/dL — ABNORMAL LOW (ref 6.5–8.1)

## 2015-03-05 LAB — ETHANOL: Alcohol, Ethyl (B): <5 mg/dL (ref <5)

## 2015-03-05 LAB — URINE DRUG SCREEN, QUALITATIVE (ARMC ONLY)
Amphetamines, Ur Screen: NOT DETECTED
BARBITURATES, UR SCREEN: NOT DETECTED
Benzodiazepine, Ur Scrn: POSITIVE — AB
CANNABINOID 50 NG, UR ~~LOC~~: NOT DETECTED
COCAINE METABOLITE, UR ~~LOC~~: NOT DETECTED
MDMA (Ecstasy)Ur Screen: NOT DETECTED
METHADONE SCREEN, URINE: NOT DETECTED
Opiate, Ur Screen: NOT DETECTED
Phencyclidine (PCP) Ur S: NOT DETECTED
TRICYCLIC, UR SCREEN: NOT DETECTED

## 2015-03-05 LAB — BLOOD GAS, VENOUS
ACID-BASE EXCESS: 0.1 mmol/L (ref 0.0–3.0)
Bicarbonate: 26 mEq/L (ref 21.0–28.0)
FIO2: 0.28
Patient temperature: 37
pCO2, Ven: 46 mmHg (ref 44.0–60.0)
pH, Ven: 7.36 (ref 7.320–7.430)

## 2015-03-05 LAB — ACETAMINOPHEN LEVEL
ACETAMINOPHEN (TYLENOL), SERUM: 17 ug/mL (ref 10–30)
Acetaminophen (Tylenol), Serum: 12 ug/mL (ref 10–30)

## 2015-03-05 LAB — CBC
HEMATOCRIT: 37.7 % (ref 35.0–47.0)
Hemoglobin: 12.9 g/dL (ref 12.0–16.0)
MCH: 31.1 pg (ref 26.0–34.0)
MCHC: 34.1 g/dL (ref 32.0–36.0)
MCV: 91.2 fL (ref 80.0–100.0)
PLATELETS: 257 10*3/uL (ref 150–440)
RBC: 4.14 MIL/uL (ref 3.80–5.20)
RDW: 14.1 % (ref 11.5–14.5)
WBC: 6.4 10*3/uL (ref 3.6–11.0)

## 2015-03-05 LAB — SALICYLATE LEVEL

## 2015-03-05 LAB — GLUCOSE, CAPILLARY
Glucose-Capillary: 108 mg/dL — ABNORMAL HIGH (ref 65–99)
Glucose-Capillary: 103 mg/dL — ABNORMAL HIGH (ref 65–99)

## 2015-03-05 LAB — MRSA PCR SCREENING: MRSA by PCR: NEGATIVE

## 2015-03-05 MED ORDER — NALOXONE HCL 1 MG/ML IJ SOLN
INTRAMUSCULAR | Status: AC
  Filled 2015-03-05: qty 2

## 2015-03-05 MED ORDER — CETYLPYRIDINIUM CHLORIDE 0.05 % MT LIQD
7.0000 mL | Freq: Four times a day (QID) | OROMUCOSAL | Status: DC
Start: 2015-03-06 — End: 2015-03-10
  Administered 2015-03-06 – 2015-03-10 (×14): 7 mL via OROMUCOSAL

## 2015-03-05 MED ORDER — CETYLPYRIDINIUM CHLORIDE 0.05 % MT LIQD: 7.0000 mL | Freq: Four times a day (QID) | OROMUCOSAL | Status: DC

## 2015-03-05 MED ORDER — ETOMIDATE 2 MG/ML IV SOLN
20.0000 mg | Freq: Once | INTRAVENOUS | Status: AC
  Administered 2015-03-05: 20 mg via INTRAVENOUS

## 2015-03-05 MED ORDER — PANTOPRAZOLE SODIUM 40 MG IV SOLR
40.0000 mg | Freq: Every day | INTRAVENOUS | Status: DC
  Administered 2015-03-05 – 2015-03-07 (×3): 40 mg via INTRAVENOUS
  Filled 2015-03-05 (×3): qty 40

## 2015-03-05 MED ORDER — NALOXONE HCL 1 MG/ML IJ SOLN
2.0000 mg | Freq: Once | INTRAMUSCULAR | Status: AC
  Administered 2015-03-05: 2 mg via INTRAVENOUS

## 2015-03-05 MED ORDER — SODIUM CHLORIDE 0.9 % IV SOLN
INTRAVENOUS | Status: DC
  Administered 2015-03-05 (×2): via INTRAVENOUS

## 2015-03-05 MED ORDER — SODIUM CHLORIDE 0.9 % IV BOLUS (SEPSIS)
1000.0000 mL | Freq: Once | INTRAVENOUS | Status: AC
  Administered 2015-03-05: 1000 mL via INTRAVENOUS

## 2015-03-05 MED ORDER — PROPOFOL 1000 MG/100ML IV EMUL
5.0000 ug/kg/min | INTRAVENOUS | Status: DC
  Administered 2015-03-06: 5 ug/kg/min via INTRAVENOUS
  Administered 2015-03-07: 15 ug/kg/min via INTRAVENOUS
  Filled 2015-03-05 (×2): qty 100

## 2015-03-05 MED ORDER — CHLORHEXIDINE GLUCONATE 0.12 % MT SOLN
15.0000 mL | Freq: Two times a day (BID) | OROMUCOSAL | Status: DC
Start: 2015-03-05 — End: 2015-03-10
  Administered 2015-03-05 – 2015-03-09 (×6): 15 mL via OROMUCOSAL

## 2015-03-05 MED ORDER — INSULIN ASPART 100 UNIT/ML ~~LOC~~ SOLN
0.0000 [IU] | Freq: Four times a day (QID) | SUBCUTANEOUS | Status: DC
  Administered 2015-03-06 – 2015-03-08 (×6): 1 [IU] via SUBCUTANEOUS
  Administered 2015-03-09: 3 [IU] via SUBCUTANEOUS
  Administered 2015-03-10: 05:00:00 2 [IU] via SUBCUTANEOUS
  Filled 2015-03-05: qty 2
  Filled 2015-03-05: qty 3
  Filled 2015-03-05 (×6): qty 1

## 2015-03-05 MED ORDER — POTASSIUM CHLORIDE 10 MEQ/100ML IV SOLN
10.0000 meq | INTRAVENOUS | Status: AC
  Administered 2015-03-05 (×4): 10 meq via INTRAVENOUS
  Filled 2015-03-05 (×4): qty 100

## 2015-03-05 MED ORDER — CHLORHEXIDINE GLUCONATE 0.12 % MT SOLN: 15.0000 mL | Freq: Two times a day (BID) | OROMUCOSAL | Status: DC

## 2015-03-05 MED ORDER — HEPARIN SODIUM (PORCINE) 5000 UNIT/ML IJ SOLN
5000.0000 [IU] | Freq: Three times a day (TID) | INTRAMUSCULAR | Status: DC
  Administered 2015-03-05 – 2015-03-10 (×15): 5000 [IU] via SUBCUTANEOUS
  Filled 2015-03-05 (×13): qty 1

## 2015-03-05 MED ORDER — SODIUM CHLORIDE 0.9 % IJ SOLN
3.0000 mL | Freq: Two times a day (BID) | INTRAMUSCULAR | Status: DC
  Administered 2015-03-05 – 2015-03-10 (×8): 3 mL via INTRAVENOUS

## 2015-03-05 MED ORDER — SUCCINYLCHOLINE CHLORIDE 20 MG/ML IJ SOLN
120.0000 mg | Freq: Once | INTRAMUSCULAR | Status: AC
  Administered 2015-03-05: 120 mg via INTRAVENOUS

## 2015-03-05 NOTE — ED Provider Notes (Signed)
[facility] Emergency Department Provider Note   ____________________________________________  Time seen: On arrival I have reviewed the triage vital signs and the triage nursing note.  HISTORY  Chief Complaint Ingestion   Historian Patient obtunded, spouse provides history  HPI Paige Dougherty is a 81 y.o. female who took an intentional ingestion this morning around 11:15 09/15/1926. Husband states that she has been battling depression as well as her diagnosis of breast cancer. She recently had completed chemotherapy for breast cancer. This morning she had been in and depressed mood and when the husband came back to check on her she was drinking a glass of water and when she went to lay down on the bed she told him goodbye and then he looked to the side and saw a bunch of empty pill bottles.A few of the bottles were older prescriptions for which we could not tell how many pills could've been in there. Several prescriptions were refilled within the last month. Mental status is decreased, and did not improved with prehospital Narcan.  Review of empty bottles reveals likely ingestion of: Rabeprazole 20 mg approximately 7 tablets Clindamycin 150 mg between 0-12 tablets hydro chlorothiazide 25 mg approximately 30 tablets Hydrocodone/acetaminophen 5/325 0-10 tablets Hydrocodone/acetaminophen 7.5/25 approximately 30 tablets Paxil 20 mg approximately 15 tablets Paxil 10 mg up to 60 tablet Alprazolam 0.5 mg approximately 30 tablets   Past Medical History  Diagnosis Date  . Diverticulosis   . GERD (gastroesophageal reflux disease)   . Esophageal stricture   . Depression   . Anxiety   . Barrett's esophagus   . IBS (irritable bowel syndrome)   . Adenomatous colon polyp   . Diabetes mellitus   . Hypertension   . Hyperlipemia   . Allergy   . Arthritis     HANDS/FEET  . Urinary incontinence   . Fibrocystic breast disease   . Chronic headache   . Cancer of  breast 12/24/2014    Patient Active Problem List   Diagnosis Date Noted  . Cancer of breast 12/24/2014  . Urinary incontinence   . Fibrocystic breast disease   . Chronic headache   . Benign essential HTN 02/22/2014  . ANXIETY 04/11/2009  . DEPRESSION 04/11/2009  . ESOPHAGEAL STRICTURE 04/11/2009  . GERD 04/11/2009  . DIVERTICULOSIS, COLON 04/11/2009  . IRRITABLE BOWEL SYNDROME 04/11/2009  . ABDOMINAL PAIN, UNSPECIFIED SITE 04/11/2009    Past Surgical History  Procedure Laterality Date  . Dilation and curettage of uterus    . Abdominal hysterectomy    . Cataract extraction      bilateral   . Carpal tunnel release      bilateral  . Finger cyst removal    . Neck cyst removal    . Upper gastrointestinal endoscopy    . Colonoscopy      Current Outpatient Rx  Name  Route  Sig  Dispense  Refill  . acetaminophen (TYLENOL) 500 MG tablet   Oral   Take by mouth.         . ALPRAZolam (XANAX) 0.5 MG tablet               . ALPRAZolam (XANAX) 0.5 MG tablet   Oral   Take by mouth.         . calcium-vitamin D (OSCAL WITH D) 500-200 MG-UNIT per tablet   Oral   Take 1 tablet by mouth 2 (two) times daily.   60 tablet   6   . cetirizine (ZYRTEC) 10  MG tablet   Oral   Take 10 mg by mouth daily.         Marland Kitchen EXPIRED: clidinium-chlordiazePOXIDE (LIBRAX) 2.5-5 MG per capsule   Oral   Take 1 capsule by mouth 2 (two) times daily.   60 capsule   2   . colestipol (COLESTID) 1 G tablet      Take 2 tablets by mouth daily   60 tablet   2   . dicyclomine (BENTYL) 20 MG tablet   Oral   Take 20 mg by mouth 2 (two) times daily.         . enalapril (VASOTEC) 10 MG tablet   Oral   Take 10 mg by mouth 2 (two) times daily.         . Grape Seed 50 MG CAPS   Oral   Take 2 capsules by mouth daily.         . hydrochlorothiazide (HYDRODIURIL) 25 MG tablet   Oral   Take 25 mg by mouth daily.         Marland Kitchen ibuprofen (ADVIL,MOTRIN) 200 MG tablet   Oral   Take 200 mg by  mouth every 8 (eight) hours as needed.         . indomethacin (INDOCIN) 25 MG capsule   Oral   Take 25 mg by mouth 3 (three) times daily as needed.         . L-METHYLFOLATE-ALGAE PO   Oral   Take 5 capsules by mouth daily.         . Loperamide HCl (ANTI-DIARRHEAL PO)   Oral   Take by mouth as needed.         . metFORMIN (GLUCOPHAGE) 500 MG tablet   Oral   Take 500 mg by mouth daily with breakfast.         . Misc Natural Products (ESSIAC TONIC PO)   Oral   Take 1 Dose by mouth 2 (two) times daily. Mixed with 3 oz of distilled water.         . Multiple Vitamins-Minerals (CENTRUM SILVER PO)   Oral   Take 1 tablet by mouth daily.         Marland Kitchen nystatin cream (MYCOSTATIN)   Topical   Apply 1 application topically daily as needed for dry skin.         Marland Kitchen nystatin-triamcinolone (MYCOLOG II) cream   Topical   Apply 1 application topically daily.         Marland Kitchen EXPIRED: omeprazole (PRILOSEC) 20 MG capsule   Oral   Take 1 capsule (20 mg total) by mouth 2 (two) times daily.   60 capsule   3   . ondansetron (ZOFRAN) 4 MG tablet   Oral   Take 1 tablet (4 mg total) by mouth every 6 (six) hours as needed for nausea or vomiting.   20 tablet   3   . OVER THE COUNTER MEDICATION   Oral   Take 1 drop by mouth daily. nes grops (7 different types)         . pantoprazole (PROTONIX) 40 MG tablet   Oral   Take 40 mg by mouth daily.         Marland Kitchen PARoxetine (PAXIL) 20 MG tablet   Oral   Take by mouth.         . Red Yeast Rice 600 MG CAPS   Oral   Take 2 capsules by mouth daily.         Marland Kitchen  rosuvastatin (CRESTOR) 10 MG tablet   Oral   Take 10 mg by mouth as directed.           Allergies Avapro; Azithromycin; Clindamycin; Duloxetine hcl; Lipitor; Metronidazole; Penicillins; Prednisone; Procaine; Procaine hcl; Eggs or egg-derived products; Other; Statins; Sulfa antibiotics; and Tetracycline  Family History  Problem Relation Age of Onset  . Colon cancer Father    . Thyroid disease Father   . Thyroid disease Mother   . Hypertension Mother   . Breast cancer Maternal Grandmother     Social History History  Substance Use Topics  . Smoking status: Never Smoker   . Smokeless tobacco: Never Used  . Alcohol Use: Yes     Comment: occasional    Review of Systems Limited due to patient's obtunded, history per husband Constitutional:  Eyes:  ENT:  Cardiovascular: No recent chest pain Respiratory: No recent shortness of breath Gastrointestinal: No recent GI illness. Genitourinary:  Musculoskeletal: Some chronic pain. Skin: Negative for rash. Neurological: Negative for headaches, focal weakness or numbness. 10 point Review of Systems otherwise negative ____________________________________________   PHYSICAL EXAM:  VITAL SIGNS: ED Triage Vitals  Enc Vitals Group     BP 03/05/15 1200 107/49 mmHg     Pulse Rate 03/05/15 1203 70     Resp 03/05/15 1203 14     Temp 03/05/15 1203 97.5 F (36.4 C)     Temp Source 03/05/15 1203 Axillary     SpO2 03/05/15 1203 94 %     Weight 03/05/15 1203 181 lb 10.5 oz (82.399 kg)     Height 03/05/15 1203 5\' 5"  (1.651 m)     Head Cir --      Peak Flow --      Pain Score 03/05/15 1205 0     Pain Loc --      Pain Edu? --      Excl. in [place]? --      Constitutional: Obtunded. Eyes: Conjunctivae are normal.  pupil is 3 mm to 2 mm with light.  ENT   Head: Normocephalic and atraumatic.   Nose: No congestion/rhinnorhea.   Mouth/Throat: Mucous membranes are moist.   Neck: No stridor. Cardiovascular/Chest: Normal rate, regular rhythm.  No murmurs, rubs, or gallops. Respiratory: Decreased air movement throughout, respiratory rate around 10. Breath sounds are clear and equal bilaterally. No wheezes/rales/rhonchi. Gastrointestinal: Soft. No distention, no guarding, no rebound. nontender   Genitourinary/rectal:Deferred Musculoskeletal: no evidence of trauma to the extremities Neurologic: Nonverbal,  does not open eyes, responds to sternal rub with eye wincing. At times tries to move the left arm. Skin:  Skin is warm, dry and intact. No rash noted.   ____________________________________________   EKG I, Lisa Roca, MD, the attending physician have personally viewed and interpreted all ECG76 bpm. Normal sinus rhythm. Narrow QRS. Normal axis. Nonspecific T-wave. QTc 432 _________________________________________  LABS (pertinent positives/negativessodium 131,, potassium 3.3, chloride 95 LFTs within normal limits Alcohol negative Salicylate less than 4 Acetaminophen 12 CBC within normal limits  Urine drug screen positive for benzodiazepine   ___________________________________________  RADIOLOGY All Xrays were viewed by me. Imaging interpreted by Radiologist.   Chest x-ray portable: No acute cardiopulmonary disease. ET tube appropriately placed. __________________________________________  PROCEDURES  Procedure(s) performed: INTUBATION Performed by: Lisa Roca  Required items: required blood products, implants, devices, and special equipment available Patient identity confirmed: provided demographic data and hospital-assigned identification number Time out: Immediately prior to procedure a "time out" was called to verify the correct patient, procedure, equipment,  support staff and site/side marked as required.  Indications: Not protecting airway due to her obtunded from intentional ingestion   Intubation method: Glidescope Laryngoscopy   Preoxygenation: BVM  Sedatives: Etomidate Paralytic: Succinylcholine  Tube Size: 7.5 cuffed  Post-procedure assessment: chest rise and ETCO2 monitor Breath sounds: equal and absent over the epigastrium Tube secured with: ETT holder Chest x-ray interpreted by radiologist and me.    Patient tolerated the procedure well with no immediate complications.    Critical Care performed: CRITICAL CARE Performed by: Lisa Roca   Total critical care time: 60 minutes  Critical care time was exclusive of separately billable procedures and treating other patients.  Critical care was necessary to treat or prevent imminent or life-threatening deterioration.  Critical care was time spent personally by me on the following activities: development of treatment plan with patient and/or surrogate as well as nursing, discussions with consultants, evaluation of patient's response to treatment, examination of patient, obtaining history from patient or surrogate, ordering and performing treatments and interventions, ordering and review of laboratory studies, ordering and review of radiographic studies, pulse oximetry and re-evaluation of patient's condition.   ____________________________________________   ED COURSE / ASSESSMENT AND PLAN  CONSULTATIONS: Poison center phone consultation, and hospitalist for admission  Pertinent labs & imaging results that were available during my care of the patient were reviewed by me and considered in my medical decision making (see chart for details).  The patient arrived after serious intentional ingestion. Vital signs stable on arrival, however patient is obtunded with just a response to painful stimuli. EKG showed no cardiac abnormalities at this time. Although initial blood pressure was above 100, she did drop her blood pressure to 70/50 and a second IV with a second liter bolus was started.  Consult with the poison center who recommended supportive care to include cardiac monitoring, if she has a seizure to treat with printed eyes pains or. I'll talk, a repeat EKG in 6 hours or at any time there are any cardiac abnormalities. Check a 4 hour Tylenol level which would be approximately 3:15 PM. They recommended initial LFTs.  I did try Narcan which did not improve her somnolence, and I suspect that the antidepressant Paxil plus the benzodiazepine are keeping her mental status low. I  concerned about her ability to protect her airway with her decreased GCS. I discussed with her husband go ahead with intubation as she is already worrisome it's only been one to 2 hours since ingestion, and I suspect things will get worse before they get better. He understands this reasoning. Patient was intubated without complication.  Patient's blood pressure was up to 100 after second liter bolus.    Patient / Family / Caregiver informed of clinical course, medical decision-making process, and agree with plan.   I discussed return precautions, follow-up instructions, and discharged instructions with patient and/or family.  ___________________________________________   FINAL CLINICAL IMPRESSION(S) / ED DIAGNOSES   Final diagnoses:  Overdose of opiate or related narcotic, intentional self-harm, initial encounter  Overdose of benzodiazepine, intentional self-harm, initial encounter  Overdose of antidepressant, intentional self-harm, initial encounter       Lisa Roca, MD 03/06/15 1536

## 2015-03-05 NOTE — ED Notes (Signed)
BEHAVIORAL HEALTH ROUNDING Patient sleeping: Yes.   Patient alert and oriented: no Behavior appropriate: Yes.  ; If no, describe:  Nutrition and fluids offered: No Toileting and hygiene offered: No Sitter present: yes Law enforcement present: No

## 2015-03-05 NOTE — ED Notes (Signed)
1-1 sitter continues with pt

## 2015-03-05 NOTE — Progress Notes (Addendum)
Status: patient is seemingly nonverbal/39yr female Intensive care Acute respiratory failure with hypoxemia Family: female and female in room, (not sure but the assumption is son and daughter n law) Visit Assessment: RN in room says that patient declined pastoral care at this time. He said he needs time alone with the patient. Silent prayers were offered for comfort outside of the door.  Pastoral Care pager number, [phone] or submit an order online

## 2015-03-05 NOTE — H&P (Addendum)
[facility] [place] NAME: Paige Dougherty    MR#:  [id number]  DATE OF BIRTH:  Apr 11, 1933  DATE OF ADMISSION:  03/05/2015  PRIMARY CARE PHYSICIAN: BABAOFF, Caryl Bis, MD   REQUESTING/REFERRING PHYSICIAN: Lord  CHIEF COMPLAINT:   Chief Complaint  Patient presents with  . Ingestion    HISTORY OF PRESENT ILLNESS:  Paige Dougherty  is a 81 y.o. female who presents with acute respiratory failure due to overdose with multiple medications. History is obtained via patient's husband who is with her in the ED today as the patient is unconscious and intubated. Husband states that the patient has a relatively recent diagnosis of breast cancer, made in spring of this year. She received lumpectomy with lymph node dissection and subsequent radiation therapy which she has completed. She did not receive any chemotherapy. Her husband states that she was put on hormonal therapy based on her cancer typing, and was unable to tolerate this medication. Per the husband's report as well as chart review by her primary oncologist, she was taken off of this therapy due to her intolerance of it. Her husband states that she began with signs and symptoms of depression sometime after being diagnosed with breast cancer, but that it got acutely worse when she was unable to tolerate the hormone therapy. Patient apparently experienced significant quality-of-life alteration with daily nausea vomiting and malaise, even after the therapy was stopped. Today she was with her husband and stated that she felt like she needed to go to bed, he helped her to bed at which point the patient elected him and said "goodbye". He was at that point that he noticed all of her medication bottles were empty at her bedside. It is unclear exactly how much medication she took, however the medications included Xanax, Paxil, hydrocodone, hydrochlorothiazide, and a PPI. Patient was intubated in route to the ED.  PAST  MEDICAL HISTORY:   Past Medical History  Diagnosis Date  . Diverticulosis   . GERD (gastroesophageal reflux disease)   . Esophageal stricture   . Depression   . Anxiety   . Barrett's esophagus   . IBS (irritable bowel syndrome)   . Adenomatous colon polyp   . Diabetes mellitus   . Hypertension   . Hyperlipemia   . Allergy   . Arthritis     HANDS/FEET  . Urinary incontinence   . Fibrocystic breast disease   . Chronic headache   . Cancer of breast 12/24/2014    PAST SURGICAL HISTORY:   Past Surgical History  Procedure Laterality Date  . Dilation and curettage of uterus    . Abdominal hysterectomy    . Cataract extraction      bilateral   . Carpal tunnel release      bilateral  . Finger cyst removal    . Neck cyst removal    . Upper gastrointestinal endoscopy    . Colonoscopy      SOCIAL HISTORY:   History  Substance Use Topics  . Smoking status: Never Smoker   . Smokeless tobacco: Never Used  . Alcohol Use: Yes     Comment: occasional    FAMILY HISTORY:   Family History  Problem Relation Age of Onset  . Colon cancer Father   . Thyroid disease Father   . Thyroid disease Mother   . Hypertension Mother   . Breast cancer Maternal Grandmother     DRUG ALLERGIES:   Allergies  Allergen Reactions  .  Avapro [Irbesartan] Other (See Comments)    unknown  . Azithromycin Other (See Comments)    Extreme vaginal burning. unknown  . Clindamycin Other (See Comments)    Vaginal burning  . Duloxetine Hcl Other (See Comments)    Hyperactivity.  . Lipitor [Atorvastatin] Other (See Comments)    "muscle aches"  . Metronidazole Other (See Comments)  . Penicillins   . Prednisone Other (See Comments)    Dizziness/double vision  . Procaine Other (See Comments)    tremors  . Procaine Hcl     tremors  . Eggs Or Egg-Derived Products Other (See Comments)    Other Reaction: Not Assessed  . Other Other (See Comments) and Anxiety    Novacaine weakness  . Statins  Rash    Arthralgias.  . Sulfa Antibiotics Rash    Vague history of a sulfa allergy, but does not remember the reaction.  . Tetracycline Rash    MEDICATIONS AT HOME:   Prior to Admission medications   Medication Sig Start Date End Date Taking? Authorizing Provider  ALPRAZolam Duanne Moron) 0.5 MG tablet Take 0.5 mg by mouth at bedtime as needed for sleep.  02/20/15  Yes Historical Provider, MD  enalapril (VASOTEC) 10 MG tablet Take 10 mg by mouth 2 (two) times daily.   Yes Historical Provider, MD  hydrochlorothiazide (HYDRODIURIL) 25 MG tablet Take 25 mg by mouth daily.   Yes Historical Provider, MD  indomethacin (INDOCIN) 25 MG capsule Take 25 mg by mouth 3 (three) times daily as needed for moderate pain.    Yes Historical Provider, MD  letrozole (FEMARA) 2.5 MG tablet Take 2.5 mg by mouth daily.   Yes Historical Provider, MD  metFORMIN (GLUCOPHAGE) 500 MG tablet Take 500 mg by mouth daily with breakfast.   Yes Historical Provider, MD  pantoprazole (PROTONIX) 40 MG tablet Take 40 mg by mouth daily.   Yes Historical Provider, MD  PARoxetine (PAXIL) 20 MG tablet Take 20 mg by mouth daily.  02/20/15 02/20/16 Yes Historical Provider, MD  acetaminophen (TYLENOL) 500 MG tablet Take by mouth.    Historical Provider, MD  calcium-vitamin D (OSCAL WITH D) 500-200 MG-UNIT per tablet Take 1 tablet by mouth 2 (two) times daily. 12/26/14   Forest Gleason, MD  cetirizine (ZYRTEC) 10 MG tablet Take 10 mg by mouth daily.    Historical Provider, MD  clidinium-chlordiazePOXIDE (LIBRAX) 2.5-5 MG per capsule Take 1 capsule by mouth 2 (two) times daily. 09/17/11 09/16/12  Lafayette Dragon, MD  colestipol (COLESTID) 1 G tablet Take 2 tablets by mouth daily 09/17/11   Lafayette Dragon, MD  dicyclomine (BENTYL) 20 MG tablet Take 20 mg by mouth 2 (two) times daily.    Historical Provider, MD  Grape Seed 50 MG CAPS Take 2 capsules by mouth daily.    Historical Provider, MD  ibuprofen (ADVIL,MOTRIN) 200 MG tablet Take 200 mg by mouth  every 8 (eight) hours as needed.    Historical Provider, MD  L-METHYLFOLATE-ALGAE PO Take 5 capsules by mouth daily.    Historical Provider, MD  Loperamide HCl (ANTI-DIARRHEAL PO) Take by mouth as needed.    Historical Provider, MD  Misc Natural Products (ESSIAC TONIC PO) Take 1 Dose by mouth 2 (two) times daily. Mixed with 3 oz of distilled water.    Historical Provider, MD  Multiple Vitamins-Minerals (CENTRUM SILVER PO) Take 1 tablet by mouth daily.    Historical Provider, MD  nystatin cream (MYCOSTATIN) Apply 1 application topically daily as needed for dry  skin.    Historical Provider, MD  nystatin-triamcinolone (MYCOLOG II) cream Apply 1 application topically daily.    Historical Provider, MD  omeprazole (PRILOSEC) 20 MG capsule Take 1 capsule (20 mg total) by mouth 2 (two) times daily. 10/07/11 10/06/12  Lafayette Dragon, MD  ondansetron (ZOFRAN) 4 MG tablet Take 1 tablet (4 mg total) by mouth every 6 (six) hours as needed for nausea or vomiting. 02/02/15   Forest Gleason, MD  OVER THE COUNTER MEDICATION Take 1 drop by mouth daily. nes grops (7 different types)    Historical Provider, MD  Red Yeast Rice 600 MG CAPS Take 2 capsules by mouth daily.    Historical Provider, MD  rosuvastatin (CRESTOR) 10 MG tablet Take 10 mg by mouth as directed.    Historical Provider, MD    REVIEW OF SYSTEMS:  Review of Systems  Unable to perform ROS: critical illness     VITAL SIGNS:   Filed Vitals:   03/05/15 1215 03/05/15 1230 03/05/15 1245 03/05/15 1344  BP:  99/47 82/40 172/82  Pulse: 71 66 65 85  Temp:      TempSrc:      Resp: 25 21 20 17   Height:      Weight:      SpO2: 97% 95% 98% 100%   Wt Readings from Last 3 Encounters:  03/05/15 82.399 kg (181 lb 10.5 oz)  02/27/15 77.1 kg (169 lb 15.6 oz)  02/03/15 78 kg (171 lb 15.3 oz)    PHYSICAL EXAMINATION:  Physical Exam  Vitals reviewed. Constitutional: She appears well-developed and well-nourished. No distress.  HENT:  Head: Normocephalic and  atraumatic.  Mouth/Throat: Oropharynx is clear and moist.  Eyes: Conjunctivae are normal. Pupils are equal, round, and reactive to light. No scleral icterus.  Neck: Normal range of motion. Neck supple. No JVD present. No thyromegaly present.  Cardiovascular: Normal rate, regular rhythm and intact distal pulses.  Exam reveals no gallop and no friction rub.   No murmur heard. Respiratory: No respiratory distress. She has no wheezes. She has no rales.  Mild bilateral coarse breath sounds possibly transmitted from upper airway, intubated and stable on ventilator.  GI: Soft. Bowel sounds are normal. She exhibits no distension. There is no tenderness.  Musculoskeletal: Normal range of motion. She exhibits no edema.  No arthritis, no gout  Lymphadenopathy:    She has no cervical adenopathy.  Neurological:  Unable to assess due to critical illness  Skin: Skin is warm and dry. No rash noted. No erythema.  Psychiatric:  Unable to assess due to critical illness    LABORATORY PANEL:   CBC  Recent Labs Lab 03/05/15 1218  WBC 6.4  HGB 12.9  HCT 37.7  PLT 257   ------------------------------------------------------------------------------------------------------------------  Chemistries   Recent Labs Lab 03/05/15 1218  NA 131*  K 3.3*  CL 95*  CO2 27  GLUCOSE 101*  BUN 13  CREATININE 0.61  CALCIUM 9.4  AST 26  ALT 25  ALKPHOS 71  BILITOT 0.5   ------------------------------------------------------------------------------------------------------------------  Cardiac Enzymes No results for input(s): TROPONINI in the last 168 hours. ------------------------------------------------------------------------------------------------------------------  RADIOLOGY:  Dg Chest Port 1 View  03/05/2015   CLINICAL DATA:  Breast cancer.  Overdose.  Intubation.  EXAM: PORTABLE CHEST - 1 VIEW  COMPARISON:  None.  FINDINGS: Endotracheal tube 2 cm from carina. Normal cardiac silhouette. There  is mild pulmonary edema pattern centrally. No focal infiltrate or pneumonitis identified. No pneumothorax.  IMPRESSION: 1. Endotracheal tube 2 cm  from carina. 2. Mild central venous congestion / pulmonary edema.   Electronically Signed   By: Suzy Bouchard M.D.   On: 03/05/2015 13:35    EKG:   Orders placed or performed during the hospital encounter of 03/05/15  . ED EKG  . ED EKG    IMPRESSION AND PLAN:  Principal Problem:   Acute respiratory failure with hypoxemia - likely due to lack of history drive secondary to her narcotic and benzodiazepine overdose. Patient is intubated, we'll continue ventilatory support until she is able to respond better and wean off the vent. Slightly to occur once these medications have worked her way out of her system. Active Problems:   Overdose of benzodiazepine - supportive care as above for her respiratory failure   Overdose of antidepressant - antidepressant was an SSRI, we'll monitor closely in ICU and continue to provide supportive care   Overdose of opiate or related narcotic - supportive care as above for her respiratory failure   Suicide attempt - psychiatry consult to evaluate the patient when she is more alert and able to participate in interview and treatment   Anxiety - psychiatry consult for the same, patient will likely need treatment for this along with her depression, and likely should not receive further prescription for benzodiazepines   Depression - treated by psychiatry, defer to their recommendations   GERD - equal at home dose PPI   Cancer of breast - finished radiation treatment, taken off hormone therapy she could not tolerate it   Benign essential HTN - monitor while here, her blood pressures been somewhat labile, goal is less than 160/100  All the records are reviewed and case discussed with ED provider. Management plans discussed with the patient and/or family.  DVT PROPHYLAXIS: SubQ heparin  ADMISSION STATUS:  Inpatient  CODE STATUS: Full  TOTAL CRITICAL CARE TIME TAKING CARE OF THIS PATIENT: 60 minutes.    Banner Huckaba [place] 03/05/2015, 3:07 PM  [facility]  Office  [phone]  CC: Primary care physician; Marcello Fennel, MD

## 2015-03-05 NOTE — Progress Notes (Signed)
[place] NOTE  Pharmacy Consult for Electrolytes  Indication: Potassium    Allergies  Allergen Reactions  . Avapro [Irbesartan] Other (See Comments)    unknown  . Azithromycin Other (See Comments)    Extreme vaginal burning. unknown  . Clindamycin Other (See Comments)    Vaginal burning  . Duloxetine Hcl Other (See Comments)    Hyperactivity.  . Lipitor [Atorvastatin] Other (See Comments)    "muscle aches"  . Metronidazole Other (See Comments)  . Penicillins   . Prednisone Other (See Comments)    Dizziness/double vision  . Procaine Other (See Comments)    tremors  . Procaine Hcl     tremors  . Eggs Or Egg-Derived Products Other (See Comments)    Other Reaction: Not Assessed  . Other Other (See Comments) and Anxiety    Novacaine weakness  . Statins Rash    Arthralgias.  . Sulfa Antibiotics Rash    Vague history of a sulfa allergy, but does not remember the reaction.  . Tetracycline Rash    Patient Measurements: Height: 5\' 5"  (165.1 cm) Weight: 181 lb 10.5 oz (82.399 kg) IBW/kg (Calculated) : 57 Adjusted Body Weight:   Vital Signs: Temp: 98.7 F (37.1 C) (07/31 1900) Temp Source: Oral (07/31 1900) BP: 174/75 mmHg (07/31 1900) Pulse Rate: 68 (07/31 1900) Intake/Output from previous day:   Intake/Output from this shift:   Vent settings for last 24 hours: Vent Mode:  [-] PRVC FiO2 (%):  [40 %] 40 % Set Rate:  [14 bmp] 14 bmp Vt Set:  [500 mL] 500 mL PEEP:  [5 cmH20] 5 cmH20  Labs:  Recent Labs  03/05/15 1218  WBC 6.4  HGB 12.9  HCT 37.7  PLT 257  CREATININE 0.61  ALBUMIN 3.4*  PROT 6.1*  AST 26  ALT 25  ALKPHOS 71  BILITOT 0.5   Estimated Creatinine Clearance: 59.5 mL/min (by C-G formula based on Cr of 0.61).   Recent Labs  03/05/15 1626  GLUCAP 108*    Microbiology: Recent Results (from the past 720 hour(s))  MRSA PCR Screening     Status: None   Collection Time: 03/05/15  4:33 PM  Result Value Ref Range  Status   MRSA by PCR NEGATIVE NEGATIVE Final    Comment:        The GeneXpert MRSA Assay (FDA approved for NASAL specimens only), is one component of a comprehensive MRSA colonization surveillance program. It is not intended to diagnose MRSA infection nor to guide or monitor treatment for MRSA infections.     Medications:  Infusions:  . sodium chloride 75 mL/hr at 03/05/15 1737  . propofol (DIPRIVAN) infusion      Assessment: 7/31 :   K = 3.3  Goal of Therapy:  K+ between 3.5 - 5.1   Plan:  Will order KCl 40 mEq IV X 1 to be given on 7/31. Will recheck K on 8/1 with AM labs.   Nadirah Socorro D 03/05/2015,7:39 PM

## 2015-03-05 NOTE — ED Notes (Signed)
EMS pt from home , overdose of "sleeping pills and narcotics" , pt recently completed Chemo tx for breast CA , and had stated " I am ready to go" pt will respond to painful stimuli.

## 2015-03-06 DIAGNOSIS — T40602A Poisoning by unspecified narcotics, intentional self-harm, initial encounter: Secondary | ICD-10-CM

## 2015-03-06 LAB — GLUCOSE, CAPILLARY
GLUCOSE-CAPILLARY: 125 mg/dL — AB (ref 65–99)
Glucose-Capillary: 85 mg/dL (ref 65–99)
Glucose-Capillary: 113 mg/dL — ABNORMAL HIGH (ref 65–99)
Glucose-Capillary: 142 mg/dL — ABNORMAL HIGH (ref 65–99)
Glucose-Capillary: 131 mg/dL — ABNORMAL HIGH (ref 65–99)

## 2015-03-06 LAB — BASIC METABOLIC PANEL
Anion gap: 10 (ref 5–15)
BUN: 15 mg/dL (ref 6–20)
CO2: 20 mmol/L — AB (ref 22–32)
Calcium: 8.8 mg/dL — ABNORMAL LOW (ref 8.9–10.3)
Chloride: 102 mmol/L (ref 101–111)
Creatinine, Ser: 1.13 mg/dL — ABNORMAL HIGH (ref 0.44–1.00)
GFR, EST AFRICAN AMERICAN: 52 mL/min — AB (ref >60)
GFR, EST NON AFRICAN AMERICAN: 45 mL/min — AB (ref >60)
Glucose, Bld: 88 mg/dL (ref 65–99)
Potassium: 3.4 mmol/L — ABNORMAL LOW (ref 3.5–5.1)
SODIUM: 132 mmol/L — AB (ref 135–145)

## 2015-03-06 LAB — CBC
HCT: 35.2 % (ref 35.0–47.0)
Hemoglobin: 12.1 g/dL (ref 12.0–16.0)
MCH: 31.4 pg (ref 26.0–34.0)
MCHC: 34.5 g/dL (ref 32.0–36.0)
MCV: 91.1 fL (ref 80.0–100.0)
PLATELETS: 240 10*3/uL (ref 150–440)
RBC: 3.86 MIL/uL (ref 3.80–5.20)
RDW: 14.4 % (ref 11.5–14.5)
WBC: 7.9 10*3/uL (ref 3.6–11.0)

## 2015-03-06 LAB — MAGNESIUM: MAGNESIUM: 1.3 mg/dL — AB (ref 1.7–2.4)

## 2015-03-06 MED ORDER — HYDRALAZINE HCL 20 MG/ML IJ SOLN: 10.0000 mg | Freq: Four times a day (QID) | INTRAMUSCULAR | Status: DC | PRN

## 2015-03-06 MED ORDER — POTASSIUM CHLORIDE 10 MEQ/100ML IV SOLN
10.0000 meq | INTRAVENOUS | Status: DC
  Administered 2015-03-06 (×3): 10 meq via INTRAVENOUS
  Filled 2015-03-06 (×4): qty 100

## 2015-03-06 MED ORDER — POTASSIUM CHLORIDE IN NACL 40-0.9 MEQ/L-% IV SOLN
INTRAVENOUS | Status: DC
  Administered 2015-03-06 – 2015-03-08 (×2): 50 mL/h via INTRAVENOUS
  Filled 2015-03-06 (×6): qty 1000

## 2015-03-06 NOTE — Progress Notes (Addendum)
[place] at [facility]                                                                                                                                                                                            Patient Demographics   Paige Dougherty, is a 81 y.o. female, DOB - 13-May-1933, SFK:[id number]  Admit date - 03/05/2015   Admitting Physician Lance Coon, MD  Outpatient Primary MD for the patient is BABAOFF, Caryl Bis, MD   LOS - 1  Subjective: Patient admitted on multiple medication overdose, patient drowsy and still on the ventilator.     Review of Systems:   On the ventilator unable to provide  Vitals:   Filed Vitals:   03/06/15 0727 03/06/15 0800 03/06/15 0900 03/06/15 1000  BP:  169/72 167/66 172/63  Pulse:  78 88 91  Temp: 98.9 F (37.2 C)     TempSrc: Axillary     Resp:  14 13 16   Height:      Weight:      SpO2:  98% 98% 99%    Wt Readings from Last 3 Encounters:  03/06/15 78.9 kg (173 lb 15.1 oz)  02/27/15 77.1 kg (169 lb 15.6 oz)  02/03/15 78 kg (171 lb 15.3 oz)     Intake/Output Summary (Last 24 hours) at 03/06/15 1213 Last data filed at 03/06/15 0719  Gross per 24 hour  Intake   1200 ml  Output   2350 ml  Net  -1150 ml    Physical Exam:   GENERAL: Critically ill HEAD, EYES, EARS, NOSE AND THROAT: Atraumatic, normocephalic. Extraocular muscles are intact. Pupils equal and reactive to light. Sclerae anicteric. No conjunctival injection. No oro-pharyngeal erythema.  NECK: Supple. There is no jugular venous distention. No bruits, no lymphadenopathy, no thyromegaly.  HEART: Regular rate and rhythm, tachycardic. No murmurs, no rubs, no clicks.  LUNGS: Clear to auscultation bilaterally. No rales or rhonchi. No wheezes.  ABDOMEN: Soft, flat, nontender, nondistended. Has good bowel sounds. No hepatosplenomegaly appreciated.  EXTREMITIES: No evidence of any cyanosis, clubbing, or peripheral edema.  +2 pedal and radial  pulses bilaterally.  NEUROLOGIC: Sleepy follows commands SKIN: Moist and warm with no rashes appreciated.  Psych: Not anxious, LN: No inguinal LN enlargement    Antibiotics   Anti-infectives    None      Medications   Scheduled Meds: . antiseptic oral rinse  7 mL Mouth Rinse QID  . chlorhexidine  15 mL Mouth Rinse BID  . heparin  5,000 Units Subcutaneous 3 times per day  . insulin aspart  0-9 Units Subcutaneous Q6H  .  pantoprazole (PROTONIX) IV  40 mg Intravenous Daily  . sodium chloride  3 mL Intravenous Q12H   Continuous Infusions: . propofol (DIPRIVAN) infusion     PRN Meds:.   Data Review:   Micro Results Recent Results (from the past 240 hour(s))  MRSA PCR Screening     Status: None   Collection Time: 03/05/15  4:33 PM  Result Value Ref Range Status   MRSA by PCR NEGATIVE NEGATIVE Final    Comment:        The GeneXpert MRSA Assay (FDA approved for NASAL specimens only), is one component of a comprehensive MRSA colonization surveillance program. It is not intended to diagnose MRSA infection nor to guide or monitor treatment for MRSA infections.     Radiology Reports Dg Abd 1 View  03/05/2015   CLINICAL DATA:  Readjustment of NG tube  EXAM: ABDOMEN - 1 VIEW  COMPARISON:  03/05/2015  FINDINGS: Enteric tube terminates in the distal gastric body.  Mild gastric distention.  IMPRESSION: Enteric tube terminates in the distal gastric body.   Electronically Signed   By: Julian Hy M.D.   On: 03/05/2015 18:40   Dg Abd 1 View  03/05/2015   CLINICAL DATA:  Overdose on multiple meds.  NG tube placement.  EXAM: ABDOMEN - 1 VIEW  COMPARISON:  None.  FINDINGS: Nasogastric tube is present with tip and side-port over the right upper quadrant likely over the distal stomach/ proximal duodenum.  Bowel gas pattern is nonobstructive. There is no free peritoneal air. There are mild degenerative changes of the spine. Lung bases are within normal.  IMPRESSION: Nonobstructive  bowel gas pattern.  Nasogastric tube with tip over the right upper quadrant likely over the distal stomach/ proximal duodenum.   Electronically Signed   By: Marin Olp M.D.   On: 03/05/2015 16:46   Dg Chest Port 1 View  03/05/2015   CLINICAL DATA:  Breast cancer.  Overdose.  Intubation.  EXAM: PORTABLE CHEST - 1 VIEW  COMPARISON:  None.  FINDINGS: Endotracheal tube 2 cm from carina. Normal cardiac silhouette. There is mild pulmonary edema pattern centrally. No focal infiltrate or pneumonitis identified. No pneumothorax.  IMPRESSION: 1. Endotracheal tube 2 cm from carina. 2. Mild central venous congestion / pulmonary edema.   Electronically Signed   By: Suzy Bouchard M.D.   On: 03/05/2015 13:35     CBC  Recent Labs Lab 03/05/15 1218 03/06/15 0400  WBC 6.4 7.9  HGB 12.9 12.1  HCT 37.7 35.2  PLT 257 240  MCV 91.2 91.1  MCH 31.1 31.4  MCHC 34.1 34.5  RDW 14.1 14.4    Chemistries   Recent Labs Lab 03/05/15 1218 03/06/15 0400  NA 131* 132*  K 3.3* 3.4*  CL 95* 102  CO2 27 20*  GLUCOSE 101* 88  BUN 13 15  CREATININE 0.61 1.13*  CALCIUM 9.4 8.8*  MG  --  1.3*  AST 26  --   ALT 25  --   ALKPHOS 71  --   BILITOT 0.5  --    ------------------------------------------------------------------------------------------------------------------ estimated creatinine clearance is 41.2 mL/min (by C-G formula based on Cr of 1.13). ------------------------------------------------------------------------------------------------------------------ No results for input(s): HGBA1C in the last 72 hours. ------------------------------------------------------------------------------------------------------------------ No results for input(s): CHOL, HDL, LDLCALC, TRIG, CHOLHDL, LDLDIRECT in the last 72 hours. ------------------------------------------------------------------------------------------------------------------ No results for input(s): TSH, T4TOTAL, T3FREE, THYROIDAB in the last 72  hours.  Invalid input(s): FREET3 ------------------------------------------------------------------------------------------------------------------ No results for input(s): VITAMINB12, FOLATE, FERRITIN, TIBC, IRON, RETICCTPCT in  the last 72 hours.  Coagulation profile No results for input(s): INR, PROTIME in the last 168 hours.  No results for input(s): DDIMER in the last 72 hours.  Cardiac Enzymes No results for input(s): CKMB, TROPONINI, MYOGLOBIN in the last 168 hours.  Invalid input(s): CK ------------------------------------------------------------------------------------------------------------------ Invalid input(s): POCBNP    Assessment & Plan   Principal Problem:   Acute respiratory failure with hypoxemia due to drug overdose continue mechanical ventilation, I will ask pulmonary to assist with extubation  Gastric distention with NG tube output follow-up x-ray   Anxiety Psychiatry evaluation pending will be able to evaluate once patient extubated   DepressionPsychiatry evaluation   GERDPPIs   Cancer of breast   Benign essential HTNWhen necessary hydralazine   Overdose of benzodiazepine   Overdose of antidepressant   Overdose of opiate or related narcotic   Suicide attempt      Code Status Orders        Start     Ordered   03/05/15 1624  Full code   Continuous     03/05/15 1623           Consults pulmonary  DVT Prophylaxis heparin  Lab Results  Component Value Date   PLT 240 03/06/2015     Time Spent 45 minutes of critical care time  Greater than 50% of time spent in care coordination and counseling.   Dustin Flock M.D on 03/06/2015 at 12:13 PM  Between 7am to 6pm - Pager - [phone]  After 6pm go to [email or web address] - password [email or web address] [facility]   Office  [phone]

## 2015-03-06 NOTE — Consult Note (Addendum)
PULMONARY/CCM CONSULT NOTE  Requesting MD/Service: Internal Medicine Date of admission: 7/31 Date of consult: 8/01 Reason for consultation: VDRF after polysubstance OD  Pt Profile:  65 F with recent diagnosis of breast cancer, s/p lumpectomy and XRT. Was unable to tolerate hormonal therapy due to nausea and depression. This was stopped but symptoms persisted. Found unresponsive with multiple empty pill bottles @ bedside. Intubated in ED due to depressed LOC   HPI:  As above  Past Medical History  Diagnosis Date  . Diverticulosis   . GERD (gastroesophageal reflux disease)   . Esophageal stricture   . Depression   . Anxiety   . Barrett's esophagus   . IBS (irritable bowel syndrome)   . Adenomatous colon polyp   . Diabetes mellitus   . Hypertension   . Hyperlipemia   . Allergy   . Arthritis     HANDS/FEET  . Urinary incontinence   . Fibrocystic breast disease   . Chronic headache   . Cancer of breast 12/24/2014    MEDICATIONS: reviewed  History   Social History  . Marital Status: Married    Spouse Name: N/A  . Number of Children: N/A  . Years of Education: N/A   Occupational History  . Not on file.   Social History Main Topics  . Smoking status: Never Smoker   . Smokeless tobacco: Never Used  . Alcohol Use: Yes     Comment: occasional  . Drug Use: No  . Sexual Activity: Not on file   Other Topics Concern  . Not on file   Social History Narrative    Family History  Problem Relation Age of Onset  . Colon cancer Father   . Thyroid disease Father   . Thyroid disease Mother   . Hypertension Mother   . Breast cancer Maternal Grandmother     ROS - N/A due to depressed LOC  Filed Vitals:   03/06/15 1200 03/06/15 1300 03/06/15 1307 03/06/15 1400  BP: 156/61 187/69 176/66 157/67  Pulse: 98 103 98 105  Temp: 99 F (37.2 C)     TempSrc: Axillary     Resp: 18 14 17 19   Height:      Weight:      SpO2: 98% 99% 98% 98%    EXAM:  Gen: WDWN, RASS -2,  with effort, + F/C HEENT: NCAT, EOMI, PERRL Neck: No JVD Lungs: Clear Cardiovascular: Reg, no M Abdomen: Soft, diminished BS Ext: warm, no edema Neuro: MAEs, no focal deficits  DATA:  BMET    Component Value Date/Time   NA 132* 03/06/2015 0400   K 3.4* 03/06/2015 0400   CL 102 03/06/2015 0400   CO2 20* 03/06/2015 0400   GLUCOSE 88 03/06/2015 0400   BUN 15 03/06/2015 0400   CREATININE 1.13* 03/06/2015 0400   CALCIUM 8.8* 03/06/2015 0400   GFRNONAA 45* 03/06/2015 0400   GFRAA 52* 03/06/2015 0400    CBC    Component Value Date/Time   WBC 7.9 03/06/2015 0400   WBC 5.3 12/02/2014 1038   RBC 3.86 03/06/2015 0400   RBC 4.52 12/02/2014 1038   HGB 12.1 03/06/2015 0400   HGB 13.3 12/02/2014 1038   HCT 35.2 03/06/2015 0400   HCT 41.3 12/02/2014 1038   PLT 240 03/06/2015 0400   PLT 220 12/02/2014 1038   MCV 91.1 03/06/2015 0400   MCV 91 12/02/2014 1038   MCH 31.4 03/06/2015 0400   MCH 29.5 12/02/2014 1038   MCHC 34.5 03/06/2015 0400  MCHC 32.2 12/02/2014 1038   RDW 14.4 03/06/2015 0400   RDW 14.9* 12/02/2014 1038   LYMPHSABS 0.9* 01/26/2015 1503   LYMPHSABS 0.8* 12/02/2014 1038   MONOABS 0.7 01/26/2015 1503   MONOABS 0.4 12/02/2014 1038   EOSABS 0.1 01/26/2015 1503   EOSABS 0.1 12/02/2014 1038   BASOSABS 0.0 01/26/2015 1503   BASOSABS 0.0 12/02/2014 1038    CXR (7/31): NACPD   EKG: poor quality  RADIOLOGY:   IMPRESSION:   Acute VDRF due to depressed LOC  Persistent obtundation prohibits extubation today...likely ready 8/02 Intentional polysubstance OD AKI, nonoliguric Mild hyponatremia Mild hypokalemia DM2, controlled Gastric ileus - high OGT output prohibits TFs  PLAN:  Cont vent support. Wean in PSV as tolerated Minimize sedation. If more responsive later in day, consider propofol to allow for easy titration off in AM 8/02 Monitor BMET intermittently Monitor I/Os Correct electrolytes as indicated IVFs adjusted Cont OGT to LIS Holding TFs Psych  eval after extubation  Husband updated in detail @ bedside Care reviewed in detail on multidisciplinary rounds  40 mins CCM time  Merton Border, MD ; [facility] service Mobile [phone].

## 2015-03-06 NOTE — Progress Notes (Signed)
Pt currently on 13mcq/kg/min of propofol for light sedation due to intubation. Pt able to follow commands, pupils equal and reactive, and able to localize pain.  Pt output of 1400 via urinary catheter and 152ml via og.  Oxygen levels within normal limits on vent at 30% fi02 and currently in spontaneous mode.  Husband and daughter at bedside throughout day.  Will continue to monitor.

## 2015-03-06 NOTE — Consult Note (Signed)
  Psychiatry: Consult received. Added myself to provider list. Patient currently intubated.Will follow up for complete eval once she can communicate.

## 2015-03-06 NOTE — Progress Notes (Signed)
[facility] NOTE  Pharmacy Consult for Electrolytes  Indication: Potassium    Allergies  Allergen Reactions  . Avapro [Irbesartan] Other (See Comments)    unknown  . Azithromycin Other (See Comments)    Extreme vaginal burning. unknown  . Clindamycin Other (See Comments)    Vaginal burning  . Duloxetine Hcl Other (See Comments)    Hyperactivity.  . Lipitor [Atorvastatin] Other (See Comments)    "muscle aches"  . Metronidazole Other (See Comments)  . Penicillins   . Prednisone Other (See Comments)    Dizziness/double vision  . Procaine Other (See Comments)    tremors  . Procaine Hcl     tremors  . Eggs Or Egg-Derived Products Other (See Comments)    Other Reaction: Not Assessed  . Other Other (See Comments) and Anxiety    Novacaine weakness  . Statins Rash    Arthralgias.  . Sulfa Antibiotics Rash    Vague history of a sulfa allergy, but does not remember the reaction.  . Tetracycline Rash    Patient Measurements: Height: 5\' 5"  (165.1 cm) Weight: 181 lb 10.5 oz (82.399 kg) IBW/kg (Calculated) : 57 Adjusted Body Weight:   Vital Signs: Temp: 98.6 F (37 C) (08/01 0410) Temp Source: Oral (08/01 0410) BP: 129/53 mmHg (08/01 0400) Pulse Rate: 68 (08/01 0400) Intake/Output from previous day: 07/31 0701 - 08/01 0700 In: 925 [I.V.:525; IV Piggyback:400] Out: 1450 [Urine:1450] Intake/Output from this shift: Total I/O In: 725 [I.V.:525; IV Piggyback:200] Out: 750 [Urine:750] Vent settings for last 24 hours: Vent Mode:  [-] PRVC FiO2 (%):  [30 %-40 %] 30 % Set Rate:  [14 bmp] 14 bmp Vt Set:  [500 mL] 500 mL PEEP:  [5 cmH20] 5 cmH20  Labs:  Recent Labs  03/05/15 1218 03/06/15 0400  WBC 6.4 7.9  HGB 12.9 12.1  HCT 37.7 35.2  PLT 257 240  CREATININE 0.61 1.13*  ALBUMIN 3.4*  --   PROT 6.1*  --   AST 26  --   ALT 25  --   ALKPHOS 71  --   BILITOT 0.5  --    Estimated Creatinine Clearance: 42.1 mL/min (by C-G formula based on Cr  of 1.13).   Recent Labs  03/05/15 1626 03/05/15 2239  GLUCAP 108* 103*    Microbiology: Recent Results (from the past 720 hour(s))  MRSA PCR Screening     Status: None   Collection Time: 03/05/15  4:33 PM  Result Value Ref Range Status   MRSA by PCR NEGATIVE NEGATIVE Final    Comment:        The GeneXpert MRSA Assay (FDA approved for NASAL specimens only), is one component of a comprehensive MRSA colonization surveillance program. It is not intended to diagnose MRSA infection nor to guide or monitor treatment for MRSA infections.     Medications:  Infusions:  . propofol (DIPRIVAN) infusion      Assessment: 7/31 :   K = 3.3  Goal of Therapy:  K+ between 3.5 - 5.1   Plan:  Will order KCl 40 mEq IV X 1 to be given on 7/31. Will recheck K on 8/1 with AM labs.    8/1 K+ 3.4. 40 mEq IV x1 ordered. Recheck in AM.  Paige Dougherty S 03/06/2015,4:58 AM

## 2015-03-06 NOTE — Progress Notes (Signed)
While propofol drip infusing maintain RASS score 0 to -1

## 2015-03-06 NOTE — Consult Note (Signed)
Patient has very early stage breast cancer.  Patient is off all anti-hormonal therapy Present episode of respiratory failure or has nothing to do with malignancy or any breast cancer therapy This and had been very depressed for a while and during the last visit appointment with psychiatrist was made, Detail note will follow when Patient, off ventilator

## 2015-03-06 NOTE — Progress Notes (Signed)
Initial Nutrition Assessment    INTERVENTION:   EN: if unable to extubate within 24-48 hours, recommend initiation of EN as soon as clinically feasible  NUTRITION DIAGNOSIS:   Inadequate oral intake related to acute illness as evidenced by NPO status.  GOAL:   Provide needs based on ASPEN/SCCM guidelines  MONITOR:    (Energy Intake, Anthropometrics, Digestive System, Electrolyte/Renal Profile, Glucose Profile, Pulmonary)  REASON FOR ASSESSMENT:   Ventilator    ASSESSMENT:    Pt admitted with acute respiratory failure requiring intubation s/p overdose; noted pt with daily N/V and malaise prior to admission, recent dx of breast cancer  Past Medical History  Diagnosis Date  . Diverticulosis   . GERD (gastroesophageal reflux disease)   . Esophageal stricture   . Depression   . Anxiety   . Barrett's esophagus   . IBS (irritable bowel syndrome)   . Adenomatous colon polyp   . Diabetes mellitus   . Hypertension   . Hyperlipemia   . Allergy   . Arthritis     HANDS/FEET  . Urinary incontinence   . Fibrocystic breast disease   . Chronic headache   . Cancer of breast 12/24/2014    Diet Order:  Diet NPO time specified  Skin:  Reviewed, no issues  Digestive System: OG with 550 mL, abdomen distended, noted xray pending for AM  Glucose Profile:  Recent Labs  03/05/15 2239 03/06/15 0506 03/06/15 1153  GLUCAP 103* 85 113*   Electrolyte and Renal Profile:  Recent Labs Lab 03/05/15 1218 03/06/15 0400  BUN 13 15  CREATININE 0.61 1.13*  NA 131* 132*  K 3.3* 3.4*  MG  --  1.3*   Nutritional Anemia Profile:  CBC Latest Ref Rng 03/06/2015 03/05/2015 01/26/2015  WBC 3.6 - 11.0 K/uL 7.9 6.4 6.8  Hemoglobin 12.0 - 16.0 g/dL 12.1 12.9 14.2  Hematocrit 35.0 - 47.0 % 35.2 37.7 42.6  Platelets 150 - 440 K/uL 240 257 260    Protein Profile:  Recent Labs Lab 03/05/15 1218  ALBUMIN 3.4*   Meds: ss novolog, diprivan, potassium chloride  Nutrition Focused Physical  Exam:  Unable to complete Nutrition-Focused physical exam at this time.    Height:   Ht Readings from Last 1 Encounters:  03/05/15 5\' 5"  (1.651 m)    Weight:   Wt Readings from Last 1 Encounters:  03/06/15 173 lb 15.1 oz (78.9 kg)   Filed Weights   03/05/15 1203 03/06/15 0600  Weight: 181 lb 10.5 oz (82.399 kg) 173 lb 15.1 oz (78.9 kg)    BMI:  Body mass index is 28.95 kg/(m^2).  Estimated Nutritional Needs:   Kcal:  [phone] kcals (11-14 kcals/kg) using wt of 78.9 kg  Protein:  114-143 g (2.0-2.5 g/kg) using IBW 57 kg  Fluid:  1425-1710 mL (25-30 ml/kg)    HIGH Care Level  Kerman Passey MS, RD, LDN [phone] Pager

## 2015-03-07 ENCOUNTER — Inpatient Hospital Stay: Payer: Commercial Managed Care - HMO

## 2015-03-07 DIAGNOSIS — J9601 Acute respiratory failure with hypoxia: Secondary | ICD-10-CM

## 2015-03-07 LAB — BASIC METABOLIC PANEL
Anion gap: 11 (ref 5–15)
BUN: 9 mg/dL (ref 6–20)
CO2: 24 mmol/L (ref 22–32)
Calcium: 9.4 mg/dL (ref 8.9–10.3)
Chloride: 100 mmol/L — ABNORMAL LOW (ref 101–111)
Creatinine, Ser: 0.79 mg/dL (ref 0.44–1.00)
GFR calc Af Amer: >60 mL/min (ref >60)
GFR calc non Af Amer: >60 mL/min (ref >60)
Glucose, Bld: 133 mg/dL — ABNORMAL HIGH (ref 65–99)
Potassium: 3.9 mmol/L (ref 3.5–5.1)
Sodium: 135 mmol/L (ref 135–145)

## 2015-03-07 LAB — GLUCOSE, CAPILLARY
GLUCOSE-CAPILLARY: 125 mg/dL — AB (ref 65–99)
GLUCOSE-CAPILLARY: 122 mg/dL — AB (ref 65–99)
GLUCOSE-CAPILLARY: 111 mg/dL — AB (ref 65–99)
Glucose-Capillary: 151 mg/dL — ABNORMAL HIGH (ref 65–99)
Glucose-Capillary: 129 mg/dL — ABNORMAL HIGH (ref 65–99)

## 2015-03-07 LAB — PHOSPHORUS: PHOSPHORUS: 2.8 mg/dL (ref 2.5–4.6)

## 2015-03-07 LAB — CBC
HCT: 39.2 % (ref 35.0–47.0)
HEMOGLOBIN: 13.2 g/dL (ref 12.0–16.0)
MCH: 31.1 pg (ref 26.0–34.0)
MCHC: 33.7 g/dL (ref 32.0–36.0)
MCV: 92.3 fL (ref 80.0–100.0)
PLATELETS: 269 10*3/uL (ref 150–440)
RBC: 4.24 MIL/uL (ref 3.80–5.20)
RDW: 14.7 % — ABNORMAL HIGH (ref 11.5–14.5)
WBC: 9 10*3/uL (ref 3.6–11.0)

## 2015-03-07 LAB — MAGNESIUM: Magnesium: 1.6 mg/dL — ABNORMAL LOW (ref 1.7–2.4)

## 2015-03-07 MED ORDER — LORATADINE 10 MG PO TABS
10.0000 mg | ORAL_TABLET | Freq: Every day | ORAL | Status: DC
  Administered 2015-03-08 – 2015-03-10 (×3): 10 mg via ORAL
  Filled 2015-03-07 (×3): qty 1

## 2015-03-07 MED ORDER — ROSUVASTATIN CALCIUM 10 MG PO TABS
10.0000 mg | ORAL_TABLET | Freq: Every evening | ORAL | Status: DC
  Administered 2015-03-08: 10 mg via ORAL
  Filled 2015-03-07 (×2): qty 1

## 2015-03-07 MED ORDER — INDOMETHACIN 25 MG PO CAPS: 25.0000 mg | ORAL_CAPSULE | Freq: Three times a day (TID) | ORAL | Status: DC | PRN

## 2015-03-07 MED ORDER — PHENOL 1.4 % MT LIQD
2.0000 | OROMUCOSAL | Status: DC | PRN
  Administered 2015-03-07: 2 via OROMUCOSAL
  Filled 2015-03-07: qty 177

## 2015-03-07 MED ORDER — MAGNESIUM SULFATE 2 GM/50ML IV SOLN
2.0000 g | Freq: Once | INTRAVENOUS | Status: AC
  Administered 2015-03-07: 2 g via INTRAVENOUS
  Filled 2015-03-07: qty 50

## 2015-03-07 MED ORDER — PANTOPRAZOLE SODIUM 40 MG PO TBEC
40.0000 mg | DELAYED_RELEASE_TABLET | Freq: Every day | ORAL | Status: DC
  Administered 2015-03-08 – 2015-03-10 (×3): 40 mg via ORAL
  Filled 2015-03-07 (×3): qty 1

## 2015-03-07 MED ORDER — CALCIUM CARBONATE-VITAMIN D 500-200 MG-UNIT PO TABS
1.0000 | ORAL_TABLET | Freq: Two times a day (BID) | ORAL | Status: DC
  Administered 2015-03-07 – 2015-03-10 (×6): 1 via ORAL
  Filled 2015-03-07 (×6): qty 1

## 2015-03-07 MED ORDER — COLESTIPOL HCL 1 G PO TABS
2.0000 g | ORAL_TABLET | Freq: Two times a day (BID) | ORAL | Status: DC
  Administered 2015-03-07 – 2015-03-10 (×5): 2 g via ORAL
  Filled 2015-03-07 (×8): qty 2

## 2015-03-07 MED ORDER — PAROXETINE HCL 20 MG PO TABS
20.0000 mg | ORAL_TABLET | Freq: Every day | ORAL | Status: DC
  Administered 2015-03-08 – 2015-03-10 (×3): 20 mg via ORAL
  Filled 2015-03-07 (×3): qty 1

## 2015-03-07 MED ORDER — IBUPROFEN 200 MG PO TABS: 200.0000 mg | ORAL_TABLET | Freq: Four times a day (QID) | ORAL | Status: DC | PRN

## 2015-03-07 NOTE — Consult Note (Signed)
  Psychiatry: Consult for this 81 year old woman who came into the hospital after taking what appears to of been an intentional overdose with probable intent to die. She was in the critical care unit ventilated for the last few days but has now been successfully extubated.  Chart reviewed. Came by to see the patient this evening. She was awake and made an effort to communicate with me but she is so tired and her voice is so quiet that I found it difficult to understand what she was saying. The more effort she put into it the harder it became for her to talk.  Notes from the chart make it seem fairly obvious that the patient had intentionally tried to kill her self probably because of her cancer.  Patient was making an effort to be cooperative. Eye contact was minimal. Psychomotor activity minimal. Speech very quiet and slow. Affect flat. Difficult to judge mood or thoughts. Was not able to get to the topic of suicidality.  I will come by and reevaluate tomorrow hopefully she will be feeling a little better and we can have more of a talk to determine what would be the best course going forward.

## 2015-03-07 NOTE — Plan of Care (Signed)
Problem: Discharge Progression Outcomes Goal: Discharge plan in place and appropriate Individualization: Pt prefers to be called Paige Dougherty who lives at home with her husband.  Hx DM, Anxiety, Depression, HTN, recent diagnosis breast ca (in February). High fall risk. Safety sitter at bedside. Bed alarm on.  Goal: Other Discharge Outcomes/Goals Plan of care progress to goals: 1. No c/o pain. Pt resting in bed, still very lethargic. 2. Hemodynamically:              -VSS, afebrile, IVF infusing             -Speech consulted & ordered "pt may have PLEASURE Ice Chips(single chips) and PLEASURE small bites of Applesauce following aspiration precautions as long as no overt s/s of aspiration noted.  Please give Meds in Puree/applesauce - break small/crush as nec./able for easier swallowing" 3. Safety sitter at bedside. Pt has flat affect, no verbal suicidal ideations. Family at bedside as well, very involved in care. 4. +2 max assist to [facility] while pt completely awake. Otherwise bedpan was utilized.

## 2015-03-07 NOTE — Progress Notes (Addendum)
PT alert and oriented to place and self.  Pt on room air, O2 within normal limits lungs clear but diminished. Foley out at 1245 pt has not voided at this time, Skylar,RN informed.  Pt to room 111 report given to [place], Therapist, sports.

## 2015-03-07 NOTE — [facility] Counselor (Signed)
Counselor informed Dr. Weber Cooks of psych. Consult.

## 2015-03-07 NOTE — Consult Note (Signed)
PULMONARY / CRITICAL CARE MEDICINE   Name: Paige Dougherty MRN: [id number] DOB: 09-21-1932    ADMISSION DATE:  03/05/2015   CHIEF COMPLAINT:   Follow up acute resp failure   Remains intubated,sedation turned off, plan for SAT/SBt today and assess for trial of extubation Family at bedside-updated and notified    SIGNIFICANT EVENTS  suicide attempt    PAST MEDICAL HISTORY    :  Past Medical History  Diagnosis Date  . Diverticulosis   . GERD (gastroesophageal reflux disease)   . Esophageal stricture   . Depression   . Anxiety   . Barrett's esophagus   . IBS (irritable bowel syndrome)   . Adenomatous colon polyp   . Diabetes mellitus   . Hypertension   . Hyperlipemia   . Allergy   . Arthritis     HANDS/FEET  . Urinary incontinence   . Fibrocystic breast disease   . Chronic headache   . Cancer of breast 12/24/2014   Past Surgical History  Procedure Laterality Date  . Dilation and curettage of uterus    . Abdominal hysterectomy    . Cataract extraction      bilateral   . Carpal tunnel release      bilateral  . Finger cyst removal    . Neck cyst removal    . Upper gastrointestinal endoscopy    . Colonoscopy     Prior to Admission medications   Medication Sig Start Date End Date Taking? Authorizing Provider  ALPRAZolam Duanne Moron) 0.5 MG tablet Take 0.5 mg by mouth at bedtime as needed for sleep.  02/20/15  Yes Historical Provider, MD  enalapril (VASOTEC) 10 MG tablet Take 10 mg by mouth 2 (two) times daily.   Yes Historical Provider, MD  hydrochlorothiazide (HYDRODIURIL) 25 MG tablet Take 25 mg by mouth daily.   Yes Historical Provider, MD  indomethacin (INDOCIN) 25 MG capsule Take 25 mg by mouth 3 (three) times daily as needed for moderate pain.    Yes Historical Provider, MD  letrozole (FEMARA) 2.5 MG tablet Take 2.5 mg by mouth daily.   Yes Historical Provider, MD  metFORMIN (GLUCOPHAGE) 500 MG tablet Take 500 mg by mouth daily with breakfast.   Yes Historical  Provider, MD  pantoprazole (PROTONIX) 40 MG tablet Take 40 mg by mouth daily.   Yes Historical Provider, MD  PARoxetine (PAXIL) 20 MG tablet Take 20 mg by mouth daily.  02/20/15 02/20/16 Yes Historical Provider, MD  acetaminophen (TYLENOL) 500 MG tablet Take by mouth.    Historical Provider, MD  calcium-vitamin D (OSCAL WITH D) 500-200 MG-UNIT per tablet Take 1 tablet by mouth 2 (two) times daily. 12/26/14   Forest Gleason, MD  cetirizine (ZYRTEC) 10 MG tablet Take 10 mg by mouth daily.    Historical Provider, MD  clidinium-chlordiazePOXIDE (LIBRAX) 2.5-5 MG per capsule Take 1 capsule by mouth 2 (two) times daily. 09/17/11 09/16/12  Lafayette Dragon, MD  colestipol (COLESTID) 1 G tablet Take 2 tablets by mouth daily 09/17/11   Lafayette Dragon, MD  dicyclomine (BENTYL) 20 MG tablet Take 20 mg by mouth 2 (two) times daily.    Historical Provider, MD  Grape Seed 50 MG CAPS Take 2 capsules by mouth daily.    Historical Provider, MD  ibuprofen (ADVIL,MOTRIN) 200 MG tablet Take 200 mg by mouth every 8 (eight) hours as needed.    Historical Provider, MD  L-METHYLFOLATE-ALGAE PO Take 5 capsules by mouth daily.    Historical Provider, MD  Loperamide HCl (ANTI-DIARRHEAL PO) Take by mouth as needed.    Historical Provider, MD  Misc Natural Products (ESSIAC TONIC PO) Take 1 Dose by mouth 2 (two) times daily. Mixed with 3 oz of distilled water.    Historical Provider, MD  Multiple Vitamins-Minerals (CENTRUM SILVER PO) Take 1 tablet by mouth daily.    Historical Provider, MD  nystatin cream (MYCOSTATIN) Apply 1 application topically daily as needed for dry skin.    Historical Provider, MD  nystatin-triamcinolone (MYCOLOG II) cream Apply 1 application topically daily.    Historical Provider, MD  omeprazole (PRILOSEC) 20 MG capsule Take 1 capsule (20 mg total) by mouth 2 (two) times daily. 10/07/11 10/06/12  Lafayette Dragon, MD  ondansetron (ZOFRAN) 4 MG tablet Take 1 tablet (4 mg total) by mouth every 6 (six) hours as needed for  nausea or vomiting. 02/02/15   Forest Gleason, MD  OVER THE COUNTER MEDICATION Take 1 drop by mouth daily. nes grops (7 different types)    Historical Provider, MD  Red Yeast Rice 600 MG CAPS Take 2 capsules by mouth daily.    Historical Provider, MD  rosuvastatin (CRESTOR) 10 MG tablet Take 10 mg by mouth as directed.    Historical Provider, MD   Allergies  Allergen Reactions  . Avapro [Irbesartan] Other (See Comments)    unknown  . Azithromycin Other (See Comments)    Extreme vaginal burning. unknown  . Clindamycin Other (See Comments)    Vaginal burning  . Duloxetine Hcl Other (See Comments)    Hyperactivity.  . Lipitor [Atorvastatin] Other (See Comments)    "muscle aches"  . Metronidazole Other (See Comments)  . Penicillins   . Prednisone Other (See Comments)    Dizziness/double vision  . Procaine Other (See Comments)    tremors  . Procaine Hcl     tremors  . Eggs Or Egg-Derived Products Other (See Comments)    Other Reaction: Not Assessed  . Other Other (See Comments) and Anxiety    Novacaine weakness  . Statins Rash    Arthralgias.  . Sulfa Antibiotics Rash    Vague history of a sulfa allergy, but does not remember the reaction.  . Tetracycline Rash     FAMILY HISTORY   Family History  Problem Relation Age of Onset  . Colon cancer Father   . Thyroid disease Father   . Thyroid disease Mother   . Hypertension Mother   . Breast cancer Maternal Grandmother       SOCIAL HISTORY    reports that she has never smoked. She has never used smokeless tobacco. She reports that she drinks alcohol. She reports that she does not use illicit drugs.  Review of Systems  Unable to perform ROS: critical illness      VITAL SIGNS    Temp:  [97.3 F (36.3 C)-99 F (37.2 C)] 97.3 F (36.3 C) (08/02 0705) Pulse Rate:  [88-116] 105 (08/02 0800) Resp:  [12-28] 26 (08/02 0800) BP: (121-187)/(57-73) 171/73 mmHg (08/02 0800) SpO2:  [98 %-99 %] 98 % (08/02 0800) FiO2 (%):   [30 %] 30 % (08/02 0811) HEMODYNAMICS:   VENTILATOR SETTINGS: Vent Mode:  [-] Spontaneous FiO2 (%):  [30 %] 30 % PEEP:  [5 cmH20] 5 cmH20 Pressure Support:  [10 cmH20-15 cmH20] 15 cmH20 INTAKE / OUTPUT:  Intake/Output Summary (Last 24 hours) at 03/07/15 1049 Last data filed at 03/07/15 1034  Gross per 24 hour  Intake 815.94 ml  Output   3000 ml  Net -2184.06 ml       PHYSICAL EXAM   Physical Exam  Constitutional: No distress.  HENT:  Head: Normocephalic and atraumatic.  Eyes: Pupils are equal, round, and reactive to light. No scleral icterus.  Neck: Normal range of motion. Neck supple.  Cardiovascular: Normal rate and regular rhythm.   No murmur heard. Pulmonary/Chest: No respiratory distress. She has no wheezes. She has rales.  resp distress  Abdominal: Soft. She exhibits no distension. There is no tenderness.  Musculoskeletal: She exhibits no edema.  Neurological: She displays normal reflexes. Coordination normal.  gcs<8T  Skin: Skin is warm. No rash noted. She is diaphoretic.       LABS   LABS:  CBC  Recent Labs Lab 03/05/15 1218 03/06/15 0400 03/07/15 0651  WBC 6.4 7.9 9.0  HGB 12.9 12.1 13.2  HCT 37.7 35.2 39.2  PLT 257 240 269   Coag's No results for input(s): APTT, INR in the last 168 hours. BMET  Recent Labs Lab 03/05/15 1218 03/06/15 0400 03/07/15 0651  NA 131* 132* 135  K 3.3* 3.4* 3.9  CL 95* 102 100*  CO2 27 20* 24  BUN _0 CREATININE 0.61 1.13* 0.79  GLUCOSE 101* 88 133*   Electrolytes  Recent Labs Lab 03/05/15 1218 03/06/15 0400 03/07/15 0651  CALCIUM 9.4 8.8* 9.4  MG  --  1.3* 1.6*  PHOS  --   --  2.8   Sepsis Markers No results for input(s): LATICACIDVEN, PROCALCITON, O2SATVEN in the last 168 hours. ABG No results for input(s): PHART, PCO2ART, PO2ART in the last 168 hours. Liver Enzymes  Recent Labs Lab 03/05/15 1218  AST 26  ALT 25  ALKPHOS 71  BILITOT 0.5  ALBUMIN 3.4*   Cardiac Enzymes No  results for input(s): TROPONINI, PROBNP in the last 168 hours. Glucose  Recent Labs Lab 03/06/15 1605 03/06/15 2139 03/06/15 2352 03/07/15 0433 03/07/15 0720 03/07/15 1043  GLUCAP 142* 131* 125* 125* 151* 129*     Recent Results (from the past 240 hour(s))  MRSA PCR Screening     Status: None   Collection Time: 03/05/15  4:33 PM  Result Value Ref Range Status   MRSA by PCR NEGATIVE NEGATIVE Final    Comment:        The GeneXpert MRSA Assay (FDA approved for NASAL specimens only), is one component of a comprehensive MRSA colonization surveillance program. It is not intended to diagnose MRSA infection nor to guide or monitor treatment for MRSA infections.      Current facility-administered medications:  .  0.9 % NaCl with KCl 40 mEq / L  infusion, , Intravenous, Continuous, Wilhelmina Mcardle, MD, Last Rate: 50 mL/hr at 03/06/15 1606, 50 mL/hr at 03/06/15 1606 .  antiseptic oral rinse (CPC / CETYLPYRIDINIUM CHLORIDE 0.05%) solution 7 mL, 7 mL, Mouth Rinse, QID, Lance Coon, MD, 7 mL at 03/07/15 0430 .  chlorhexidine (PERIDEX) 0.12 % solution 15 mL, 15 mL, Mouth Rinse, BID, Lance Coon, MD, 15 mL at 03/07/15 0800 .  heparin injection 5,000 Units, 5,000 Units, Subcutaneous, 3 times per day, Lance Coon, MD, 5,000 Units at 03/07/15 0546 .  hydrALAZINE (APRESOLINE) injection 10 mg, 10 mg, Intravenous, Q6H PRN, Dustin Flock, MD .  insulin aspart (novoLOG) injection 0-9 Units, 0-9 Units, Subcutaneous, Q6H, Lance Coon, MD, 1 Units at 03/07/15 [phone] .  pantoprazole (PROTONIX) injection 40 mg, 40 mg, Intravenous, Daily, Lance Coon, MD, 40 mg at 03/07/15 1037 .  phenol (CHLORASEPTIC) mouth  spray 2 spray, 2 spray, Mouth/Throat, Q4H PRN, Flora Lipps, MD .  propofol (DIPRIVAN) 1000 MG/100ML infusion, 5-80 mcg/kg/min, Intravenous, Titrated, Laverle Hobby, MD, Stopped at 03/07/15 0749 .  sodium chloride 0.9 % injection 3 mL, 3 mL, Intravenous, Q12H, Lance Coon, MD, 3 mL at  03/07/15 1000  IMAGING    Dg Abd 1 View  03/07/2015   CLINICAL DATA:  Distended abdomen.  EXAM: ABDOMEN - 1 VIEW  COMPARISON:  03/05/2015.  FINDINGS: NG tube in stable position. Interim resolution of gastric distention. No bowel distention. No free air. Pelvic calcifications consistent phleboliths. Lumbar scoliosis concave right diffuse degenerative change present. Tiny sclerotic density left proximal femur most likely a bone island.  IMPRESSION: 1. NG tube noted with tip in good anatomic position. 2. Interim resolution of gastric distention. No bowel distention noted.   Electronically Signed   By: Marcello Moores  Register   On: 03/07/2015 07:23      Indwelling Urinary Catheter continued, requirement due to   Reason to continue Indwelling Urinary Catheter for strict Intake/Output monitoring for hemodynamic instability         Ventilator continued, requirement due to, resp failure    Ventilator Sedation RASS 0 to -2      ASSESSMENT/PLAN   81 yo white female admitted to ICU for acute resp failure from acute Overdose with encephalopathy and inability to protect airway   PULMONARY-plan for SAT/SBt today -Respiratory Failure -continue Full MV support -continue Bronchodilator Therapy -Wean Fio2 and PEEP as tolerated -will perform SAT/SBt when respiratory parameters are met   CARDIOVASCULAR -ICU monitoring  RENAL Follow UO -foley catheter  GASTROINTESTINAL NPO for assessment of SAT/SBT -GI prophylaxis  HEMATOLOGIC Follow h/h    NEUROLOGIC - intubated and sedated -wean off sedation and assess neuro status     I have personally obtained a history, examined the patient, evaluated laboratory and independently reviewed  imaging results, formulated the assessment and plan and placed orders.  The Patient requires high complexity decision making for assessment and support, frequent evaluation and titration of therapies, application of advanced monitoring technologies and  extensive interpretation of multiple databases. Critical Care Time devoted to patient care services described in this note is 40 minutes.   Overall, patient is critically ill, prognosis is guarded. Patient at high risk for cardiac arrest and death.    Corrin Parker, M.D.  [facility]  Medical Director [place] Director [facility] Cardio-Pulmonary Department

## 2015-03-07 NOTE — Evaluation (Signed)
Clinical/Bedside Swallow Evaluation Patient Details  Name: Paige Dougherty MRN: [id number] Date of Birth: April 06, 1933  Today's Date: 03/07/2015 Time: SLP Start Time (ACUTE ONLY): 7412 SLP Stop Time (ACUTE ONLY): 8786 SLP Time Calculation (min) (ACUTE ONLY): 60 min  Past Medical History:  Past Medical History  Diagnosis Date  . Diverticulosis   . GERD (gastroesophageal reflux disease)   . Esophageal stricture   . Depression   . Anxiety   . Barrett's esophagus   . IBS (irritable bowel syndrome)   . Adenomatous colon polyp   . Diabetes mellitus   . Hypertension   . Hyperlipemia   . Allergy   . Arthritis     HANDS/FEET  . Urinary incontinence   . Fibrocystic breast disease   . Chronic headache   . Cancer of breast 12/24/2014   Past Surgical History:  Past Surgical History  Procedure Laterality Date  . Dilation and curettage of uterus    . Abdominal hysterectomy    . Cataract extraction      bilateral   . Carpal tunnel release      bilateral  . Finger cyst removal    . Neck cyst removal    . Upper gastrointestinal endoscopy    . Colonoscopy     HPI:  Pt is a 81 y.o. female who presents with acute respiratory failure due to overdose with multiple medications. History is obtained via patient's husband who is with her in the ED today as the patient is unconscious and intubated. Husband states that the patient has a relatively recent diagnosis of breast cancer, made in spring of this year. She received lumpectomy with lymph node dissection and subsequent radiation therapy which she has completed. She did not receive any chemotherapy. Her husband states that she was put on hormonal therapy based on her cancer typing, and was unable to tolerate this medication. Per the husband's report as well as chart review by her primary oncologist, she was taken off of this therapy due to her intolerance of it. Her husband states that she began with signs and symptoms of depression sometime after  being diagnosed with breast cancer, but that it got acutely worse when she was unable to tolerate the hormone therapy. Patient apparently experienced significant quality-of-life alteration with daily nausea vomiting and malaise, even after the therapy was stopped. Today she was with her husband and stated that she felt like she needed to go to bed, he helped her to bed at which point the patient elected him and said "goodbye". He was at that point that he noticed all of her medication bottles were empty at her bedside. It is unclear exactly how much medication she took, however the medications included Xanax, Paxil, hydrocodone, hydrochlorothiazide, and a PPI. Patient was intubated in route to the ED. Pt is currently NPO d/t recent oral extubation late this AM per chart. Pt is tolerating extubation, however, exhibits moderate discomfort when coughing and vocal quality is declined in status.   Assessment / Plan / Recommendation Clinical Impression  Pt appeared to safely tolerate trials of ice chips and purees(applesauce in small amounts) w/ no immediate/delayed, overt s/s of aspiration noted. Pt exhibited delayed throat clearing/coughing w/ 1/4 trials of thin liquids; she c/o increased "burning" feeling when drinking the liquids. Suspect this is related to her oral intubation and the irritation of such during the process at time of admitting. Due to pt's decreased effort of her cough(weak) and the raspy/breathy quality w/ decreased volume, as well as  her report of pain when she coughed, pt could be at increased risk for aspiration w/ poor airway protection(coughing). Pt remains min. drowsy in addition to all of this. MD consulted and agreed w/ pt remaining NPO w/ pleasure po's of applesauce and ice chips(single chips) w/ NSG supervision; aspiration precautions. NSG to attempt meds in applesauce - crushed if nec. for easier swallowing. ST will f/u tomorrow for continued assessment of pt's swallow function in order  to initiate an oral diet. Dtr present and education on eval; recs. Dtr/pt agreed. NSG updated.      Aspiration Risk  Mild    Diet Recommendation NPO (w/ pleasure po's of applesauce and ice chips)   Medication Administration: Crushed with puree Compensations: Slow rate;Small sips/bites    Other  Recommendations Recommended Consults:  (TBD) Oral Care Recommendations: Oral care BID;Oral care before and after PO;Staff/trained caregiver to provide oral care Other Recommendations:  (possible Dietician consult)   Follow Up Recommendations       Frequency and Duration min 3x week  1 week   Pertinent Vitals/Pain Pt reported brief pain when she coughed; subsided when not coughing. NSG made aware.     SLP Swallow Goals  see care plan   Swallow Study Prior Functional Status   lived at home; independent w/ ADLs. See general information.     General Date of Onset: 03/05/15 Other Pertinent Information: Pt is a 81 y.o. female who presents with acute respiratory failure due to overdose with multiple medications. History is obtained via patient's husband who is with her in the ED today as the patient is unconscious and intubated. Husband states that the patient has a relatively recent diagnosis of breast cancer, made in spring of this year. She received lumpectomy with lymph node dissection and subsequent radiation therapy which she has completed. She did not receive any chemotherapy. Her husband states that she was put on hormonal therapy based on her cancer typing, and was unable to tolerate this medication. Per the husband's report as well as chart review by her primary oncologist, she was taken off of this therapy due to her intolerance of it. Her husband states that she began with signs and symptoms of depression sometime after being diagnosed with breast cancer, but that it got acutely worse when she was unable to tolerate the hormone therapy. Patient apparently experienced significant  quality-of-life alteration with daily nausea vomiting and malaise, even after the therapy was stopped. Today she was with her husband and stated that she felt like she needed to go to bed, he helped her to bed at which point the patient elected him and said "goodbye". He was at that point that he noticed all of her medication bottles were empty at her bedside. It is unclear exactly how much medication she took, however the medications included Xanax, Paxil, hydrocodone, hydrochlorothiazide, and a PPI. Patient was intubated in route to the ED. Pt is currently NPO d/t recent oral extubation late this AM per chart. Pt is tolerating extubation, however, exhibits moderate discomfort when coughing and vocal quality is declined in status. Type of Study: Bedside swallow evaluation Previous Swallow Assessment: none - pt denied any trouble swallowing prior to hospitalization Diet Prior to this Study: Regular;Thin liquids Temperature Spikes Noted: No (WBC 9.0) Respiratory Status: Supplemental O2 delivered via (comment) (2 liters) History of Recent Intubation: Yes Length of Intubations (days): 2 days Date extubated: 03/07/15 Behavior/Cognition: Alert;Cooperative;Requires cueing Oral Cavity - Dentition: Adequate natural dentition/normal for age Self-Feeding Abilities: Total assist;Needs assist  Patient Positioning: Upright in bed Baseline Vocal Quality: Breathy;Hoarse;Low vocal intensity (recent extubation) Volitional Cough: Weak Volitional Swallow: Able to elicit    Oral/Motor/Sensory Function Overall Oral Motor/Sensory Function: Appears within functional limits for tasks assessed Labial ROM: Within Functional Limits Labial Symmetry: Within Functional Limits Labial Strength: Within Functional Limits Lingual ROM: Within Functional Limits Lingual Symmetry: Within Functional Limits Lingual Strength: Within Functional Limits Facial Symmetry: Within Functional Limits Mandible: Within Functional Limits   Ice  Chips Ice chips: Within functional limits Presentation: Spoon;Self Fed (assisted) Other Comments: pt quite weak   Thin Liquid Thin Liquid: Impaired Presentation: Cup;Self Fed (assisted; 4 trials) Oral Phase Impairments:  (none) Oral Phase Functional Implications:  (none) Pharyngeal  Phase Impairments: Throat Clearing - Delayed (delayed cough x1 trial) Other Comments: pt stated it "burned" when swallowing pointing to her neck and upper chest    Nectar Thick Nectar Thick Liquid: Not tested   Honey Thick Honey Thick Liquid: Not tested   Puree Puree: Within functional limits Presentation: Spoon (assisted w/ feeding; 4 trials) Other Comments: less report of "burning" but was distracted by needing to go to the bathroom   Solid   GO    Solid: Not tested      Orinda Kenner, MS, CCC-SLP  Watson,Katherine 03/07/2015,4:49 PM

## 2015-03-07 NOTE — Progress Notes (Signed)
Nutrition Follow-up    INTERVENTION:   Medical Food Supplement Therapy: pt would likely benefit from addition of nutritional supplement once diet advanced   NUTRITION DIAGNOSIS:   Inadequate oral intake related to acute illness as evidenced by NPO status. Continues but being addressed as planning burke swallow/diet advancement post extubation  GOAL:   Provide needs based on ASPEN/SCCM guidelines   MONITOR:    (Energy Intake, Anthropometrics, Digestive System, Electrolyte/Renal Profile, Glucose Profile, Pulmonary)   ASSESSMENT:    Pt s/p extubation this AM  Diet Order:  Diet NPO time specified  Skin:  Reviewed, no issues  Digestive System: OG with 150 mL out, OG removed with extubation; +small BM  Electrolyte and Renal Profile:  Recent Labs Lab 03/05/15 1218 03/06/15 0400 03/07/15 0651  BUN 13 15 9   CREATININE 0.61 1.13* 0.79  NA 131* 132* 135  K 3.3* 3.4* 3.9  MG  --  1.3* 1.6*  PHOS  --   --  2.8   Glucose Profile:   Recent Labs  03/07/15 0433 03/07/15 0720 03/07/15 1043  GLUCAP 125* 151* 129*   Protein Profile:   Recent Labs Lab 03/05/15 1218  ALBUMIN 3.4*   Meds: NS with KCl at 50 ml/hr, ss novolog, magnesium sulfate  Height:   Ht Readings from Last 1 Encounters:  03/05/15 5\' 5"  (1.651 m)    Weight:   Wt Readings from Last 1 Encounters:  03/06/15 173 lb 15.1 oz (78.9 kg)   Filed Weights   03/05/15 1203 03/06/15 0600  Weight: 181 lb 10.5 oz (82.399 kg) 173 lb 15.1 oz (78.9 kg)     BMI:  Body mass index is 28.95 kg/(m^2).  Estimated Nutritional Needs:   Kcal:  1371-1619 kcals (BEE 1038, 1.2 AF, 1.1-1.3 IF) using IBW 56.8 kg  Protein:  65-86 g (1.2-1.5 g/kg)   Fluid:  1425-1710 mL (25-30 ml/kg)   HIGH Care Level  Kerman Passey MS, RD, LDN [phone] Pager

## 2015-03-07 NOTE — Care Management Important Message (Signed)
Important Message  Patient Details  Name: Paige Dougherty MRN: [id number] Date of Birth: [birth year]   Medicare Important Message Given:  Yes-second notification given    Juliann Pulse A Allmond 03/07/2015, 10:07 AM

## 2015-03-07 NOTE — Progress Notes (Signed)
Pt completely off Propofol at 0745, extubated at 0925 as per Dr.Kasa verbal order.  Pt alert and oriented drowsy at time but easily arousable.  Lungs are diminished but clear.  Pt currently on 2L nasal cannula. Family at the bedside.

## 2015-03-07 NOTE — Progress Notes (Signed)
RT to room to extubate patient.  Patient was suctioned prior to extubation.  Patient tolerated extubation well, placed on 2LPM [place], O2 sats 98%.  Patient able to speak, no stridor noted.

## 2015-03-07 NOTE — Progress Notes (Signed)
[facility] NOTE  Pharmacy Consult for Electrolyte Supplementation    Allergies  Allergen Reactions  . Avapro [Irbesartan] Other (See Comments)    unknown  . Azithromycin Other (See Comments)    Extreme vaginal burning. unknown  . Clindamycin Other (See Comments)    Vaginal burning  . Duloxetine Hcl Other (See Comments)    Hyperactivity.  . Lipitor [Atorvastatin] Other (See Comments)    "muscle aches"  . Metronidazole Other (See Comments)  . Penicillins   . Prednisone Other (See Comments)    Dizziness/double vision  . Procaine Other (See Comments)    tremors  . Procaine Hcl     tremors  . Eggs Or Egg-Derived Products Other (See Comments)    Other Reaction: Not Assessed  . Other Other (See Comments) and Anxiety    Novacaine weakness  . Statins Rash    Arthralgias.  . Sulfa Antibiotics Rash    Vague history of a sulfa allergy, but does not remember the reaction.  . Tetracycline Rash    Patient Measurements: Height: 5\' 5"  (165.1 cm) Weight: 173 lb 15.1 oz (78.9 kg) IBW/kg (Calculated) : 57 Adjusted Body Weight:   Vital Signs: Temp: 97.3 F (36.3 C) (08/02 0705) Temp Source: Axillary (08/02 0705) BP: 153/64 mmHg (08/02 1100) Pulse Rate: 103 (08/02 1100) Intake/Output from previous day: 08/01 0701 - 08/02 0700 In: 915.9 [I.V.:795.9; NG/GT:20; IV Piggyback:100] Out: 2650 [Urine:2500; Emesis/NG output:150] Intake/Output from this shift: Total I/O In: -  Out: 350 [Urine:350] Vent settings for last 24 hours: Vent Mode:  [-] Spontaneous FiO2 (%):  [30 %] 30 % PEEP:  [5 cmH20] 5 cmH20 Pressure Support:  [10 cmH20-15 cmH20] 15 cmH20  Labs:  Recent Labs  03/05/15 1218 03/06/15 0400 03/07/15 0651  WBC 6.4 7.9 9.0  HGB 12.9 12.1 13.2  HCT 37.7 35.2 39.2  PLT 257 240 269  CREATININE 0.61 1.13* 0.79  MG  --  1.3* 1.6*  PHOS  --   --  2.8  ALBUMIN 3.4*  --   --   PROT 6.1*  --   --   AST 26  --   --   ALT 25  --   --   ALKPHOS 71   --   --   BILITOT 0.5  --   --    Estimated Creatinine Clearance: 58.3 mL/min (by C-G formula based on Cr of 0.79).   Recent Labs  03/07/15 0433 03/07/15 0720 03/07/15 1043  GLUCAP 125* 151* 129*    Microbiology: Recent Results (from the past 720 hour(s))  MRSA PCR Screening     Status: None   Collection Time: 03/05/15  4:33 PM  Result Value Ref Range Status   MRSA by PCR NEGATIVE NEGATIVE Final    Comment:        The GeneXpert MRSA Assay (FDA approved for NASAL specimens only), is one component of a comprehensive MRSA colonization surveillance program. It is not intended to diagnose MRSA infection nor to guide or monitor treatment for MRSA infections.     Medications:  Infusions:  . 0.9 % NaCl with KCl 40 mEq / L 50 mL/hr (03/06/15 1606)  . propofol (DIPRIVAN) infusion Stopped (03/07/15 0749)    Assessment: Electrolytes within normal limits except magnesium.   Mag: 1.6  Goal of Therapy:  Electrolyte values wnl  Plan:  Will order Magnesium 2 gm IV once.  Will recheck labs in AM.  Pharmacy will continue to follow.   Thadius Smisek G  03/07/2015,11:52 AM

## 2015-03-07 NOTE — Progress Notes (Signed)
Foley removed in CCU at 1245pm. Multiple attempts throughout the day to void, no success. Bladder Scan 372mL. Will continue to assess & pass on to night shift RN.

## 2015-03-07 NOTE — Progress Notes (Signed)
   03/07/15 0945  Clinical Encounter Type  Visited With Family  Visit Type Initial  Consult/Referral To Chaplain  Spiritual Encounters  Spiritual Needs Emotional  Stress Factors  Family Stress Factors None identified  Chaplain rounded in unit and waiting room. Offered a compassionate presence and support to the family as applicable. Chaplain Satchel Heidinger A. Isidra Mings Ext. [phone]

## 2015-03-07 NOTE — Progress Notes (Signed)
Pt remains on vent. Vent settings as charted.  Sedated with with propofol. SR on Cm. Foley in place. UOP good. Lung sound diminished. Resting comfortably in bed without any distress.continue to observeclosely.

## 2015-03-08 DIAGNOSIS — F322 Major depressive disorder, single episode, severe without psychotic features: Secondary | ICD-10-CM

## 2015-03-08 LAB — GLUCOSE, CAPILLARY
GLUCOSE-CAPILLARY: 135 mg/dL — AB (ref 65–99)
GLUCOSE-CAPILLARY: 134 mg/dL — AB (ref 65–99)
Glucose-Capillary: 115 mg/dL — ABNORMAL HIGH (ref 65–99)
Glucose-Capillary: 98 mg/dL (ref 65–99)

## 2015-03-08 LAB — BASIC METABOLIC PANEL
Anion gap: 8 (ref 5–15)
BUN: 11 mg/dL (ref 6–20)
CHLORIDE: 103 mmol/L (ref 101–111)
CO2: 25 mmol/L (ref 22–32)
CREATININE: 0.76 mg/dL (ref 0.44–1.00)
Calcium: 9.4 mg/dL (ref 8.9–10.3)
GFR calc non Af Amer: >60 mL/min (ref >60)
GLUCOSE: 115 mg/dL — AB (ref 65–99)
POTASSIUM: 4.2 mmol/L (ref 3.5–5.1)
Sodium: 136 mmol/L (ref 135–145)

## 2015-03-08 LAB — MAGNESIUM: Magnesium: 1.5 mg/dL — ABNORMAL LOW (ref 1.7–2.4)

## 2015-03-08 LAB — PHOSPHORUS: Phosphorus: 2.4 mg/dL — ABNORMAL LOW (ref 2.5–4.6)

## 2015-03-08 MED ORDER — MAGNESIUM SULFATE 4 GM/100ML IV SOLN
4.0000 g | Freq: Once | INTRAVENOUS | Status: AC
Start: 2015-03-08 — End: 2015-03-08
  Administered 2015-03-08: 10:00:00 4 g via INTRAVENOUS
  Filled 2015-03-08: qty 100

## 2015-03-08 MED ORDER — MIRTAZAPINE 15 MG PO TBDP
15.0000 mg | ORAL_TABLET | Freq: Every day | ORAL | Status: DC
  Administered 2015-03-08 – 2015-03-09 (×2): 15 mg via ORAL
  Filled 2015-03-08 (×4): qty 1

## 2015-03-08 MED ORDER — MAGNESIUM OXIDE 400 (241.3 MG) MG PO TABS
400.0000 mg | ORAL_TABLET | Freq: Two times a day (BID) | ORAL | Status: AC
  Administered 2015-03-08 (×2): 400 mg via ORAL
  Filled 2015-03-08 (×2): qty 1

## 2015-03-08 MED ORDER — POTASSIUM & SODIUM PHOSPHATES 280-160-250 MG PO PACK
1.0000 | PACK | Freq: Three times a day (TID) | ORAL | Status: AC
  Administered 2015-03-08 (×2): 1 via ORAL
  Filled 2015-03-08 (×4): qty 1

## 2015-03-08 NOTE — Consult Note (Signed)
[facility]iat[facility] Consult   Reason for Consult:  Consult for this 81 year old woman with an intentional suicide attempt Referring Physician:  Posey Pronto Patient Identification: Paige Dougherty MRN:  [id number] Principal Diagnosis: Acute respiratory failure with hypoxemia Diagnosis:   Patient Active Problem List   Diagnosis Date Noted  . Depression, major, single episode, severe [F32.2] 03/08/2015  . Overdose of benzodiazepine [T42.4X1A] 03/05/2015  . Overdose of antidepressant [T43.201A] 03/05/2015  . Overdose of opiate or related narcotic [T40.601A] 03/05/2015  . Suicide attempt [T14.91] 03/05/2015  . Acute respiratory failure with hypoxemia [J96.01] 03/05/2015  . Cancer of breast [C50.919] 12/24/2014  . Urinary incontinence [R32]   . Fibrocystic breast disease [N60.19]   . Chronic headache [R51]   . Benign essential HTN [I10] 02/22/2014  . Anxiety [F41.9] 04/11/2009  . Depression [F32.9] 04/11/2009  . ESOPHAGEAL STRICTURE [K22.2] 04/11/2009  . GERD [K21.9] 04/11/2009  . DIVERTICULOSIS, COLON [K57.30] 04/11/2009  . IRRITABLE BOWEL SYNDROME [K58.9] 04/11/2009  . ABDOMINAL PAIN, UNSPECIFIED SITE [R10.9] 04/11/2009    Total Time spent with patient: 1 hour  Subjective:   Paige Dougherty is a 81 y.o. female patient admitted with "I'm embarrassed about what I did".  HPI:  Information from the chart from the patient and from the patient's family including the daughter and husband both of whom are present. I was finally able to do a fairly complete assessment on this patient today. Patient states that she had been feeling depressed probably for several weeks prior to admission. She describes anhedonia with a lack of any interest or in her usual activities and the lack of any motivation. She was feeling hopeless. She was feeling tired had poor appetite poor sleep. Patient admits that the doctors had told her that she was currently cancer free and yet she appeared to be hopeless about her  cancer and to be justifying to herself or suicide attempt. She admits that she took a large quantity of all of her medication in an attempt to kill her self. She is not reporting definite hallucinations. Husband and daughter support the history stating that she had been clearly depressed for several weeks. Some other information that might bear on the situation is that after having surgery and radiation her oncologist had wanted her to start long-term suppressing medication, probably an estrogen blocker like tamoxifen. Evidently the patient had some GI side effects and couldn't tolerate it. She interpreted this as being a hopeless situation. The daughter and husband also give an interesting extra piece of history. Beginning last October or November, several months before her cancer diagnosis, the patient was abruptly taken off of hormone replacement therapy that she had been on for years. Sometime shortly after that she developed behaviors that were very atypical for her. She apparently was stating she was going to divorce her husband and move out of the house and seems to of been agitated and possibly manic from their description. It sounds like it calm down a little bit towards the end of the year and into January and then she got the cancer diagnosis. Husband reports other details that supports the seriousness of her suicide attempt. He had reported that she made comments to him about wishing that she could die like their neighbor who died in her sleep and about how she very pointedly said goodbye to him after taking the overdose.  Past psychiatric history: Patient states that when she was an adolescent she had an episode of extreme sadness and depression when her family  very briefly moved to [place]. At that time she contemplated suicide but said she did not attempt it. She describes herself as having been a "nervous" person all of her life. She had been treated with paroxetine for years so far back that the  original syndrome is not entirely clear. No other known psychiatric medicine. No known prior suicide attempts and no known psychiatric hospitalization.  Medical history: Patient was diagnosed with breast cancer early in this year. She had surgery and radiation. Otherwise she seems to be in pretty good health. History of esophageal stricture and some irritable bowel syndrome.  Social history: Patient lives with her husband. The 2 of them of been married for almost 60 years. She has adult children and appears to have a good relationship with him. Both the husband and the daughter report that both the manic like behavior and the depression were out of character for her and that she normally seemed to be a happy and socially involved person.  Family history: The daughter reports that she has panic attacks. It's also reported there may be other anxiety in the family.  Substance abuse history: No known history of alcohol or drug abuse.  Current situation: Patient was in the critical care unit for several days after her overdose. She has now been extubated and is recovering any regular medical bed. She continues to report being so weak that she cannot stand up and to be eating very little. HPI Elements:   Quality:  Severe depression with a serious suicide attempt. Severity:  Extremely severe almost managed to kill her self. Ongoing depression. Timing:  Seems to of been up and down over the last several months with at least 8 weeks or so of depression culminating in this suicide attempt. Duration:  As noted above it is also still ongoing. Context:  Recent diagnosis and treatment for cancer although from everything I can tell the treatment was successful and she was not in a hopeless or chronic pain situation.  Past Medical History:  Past Medical History  Diagnosis Date  . Diverticulosis   . GERD (gastroesophageal reflux disease)   . Esophageal stricture   . Depression   . Anxiety   . Barrett's  esophagus   . IBS (irritable bowel syndrome)   . Adenomatous colon polyp   . Diabetes mellitus   . Hypertension   . Hyperlipemia   . Allergy   . Arthritis     HANDS/FEET  . Urinary incontinence   . Fibrocystic breast disease   . Chronic headache   . Cancer of breast 12/24/2014    Past Surgical History  Procedure Laterality Date  . Dilation and curettage of uterus    . Abdominal hysterectomy    . Cataract extraction      bilateral   . Carpal tunnel release      bilateral  . Finger cyst removal    . Neck cyst removal    . Upper gastrointestinal endoscopy    . Colonoscopy     Family History:  Family History  Problem Relation Age of Onset  . Colon cancer Father   . Thyroid disease Father   . Thyroid disease Mother   . Hypertension Mother   . Breast cancer Maternal Grandmother    Social History:  History  Alcohol Use  . Yes    Comment: occasional     History  Drug Use No    History   Social History  . Marital Status: Married  Spouse Name: N/A  . Number of Children: N/A  . Years of Education: N/A   Social History Main Topics  . Smoking status: Never Smoker   . Smokeless tobacco: Never Used  . Alcohol Use: Yes     Comment: occasional  . Drug Use: No  . Sexual Activity: Not on file   Other Topics Concern  . None   Social History Narrative   Additional Social History:                          Allergies:   Allergies  Allergen Reactions  . Avapro [Irbesartan] Other (See Comments)    unknown  . Azithromycin Other (See Comments)    Extreme vaginal burning. unknown  . Clindamycin Other (See Comments)    Vaginal burning  . Duloxetine Hcl Other (See Comments)    Hyperactivity.  . Lipitor [Atorvastatin] Other (See Comments)    "muscle aches"  . Metronidazole Other (See Comments)  . Penicillins   . Prednisone Other (See Comments)    Dizziness/double vision  . Procaine Other (See Comments)    tremors  . Procaine Hcl     tremors  .  Eggs Or Egg-Derived Products Other (See Comments)    Other Reaction: Not Assessed  . Other Other (See Comments) and Anxiety    Novacaine weakness  . Statins Rash    Arthralgias.  . Sulfa Antibiotics Rash    Vague history of a sulfa allergy, but does not remember the reaction.  . Tetracycline Rash    Labs:  Results for orders placed or performed during the hospital encounter of 03/05/15 (from the past 48 hour(s))  Glucose, capillary     Status: Abnormal   Collection Time: 03/06/15  9:39 PM  Result Value Ref Range   Glucose-Capillary 131 (H) 65 - 99 mg/dL  Glucose, capillary     Status: Abnormal   Collection Time: 03/06/15 11:52 PM  Result Value Ref Range   Glucose-Capillary 125 (H) 65 - 99 mg/dL   Comment 1 Notify RN   Glucose, capillary     Status: Abnormal   Collection Time: 03/07/15  4:33 AM  Result Value Ref Range   Glucose-Capillary 125 (H) 65 - 99 mg/dL  CBC     Status: Abnormal   Collection Time: 03/07/15  6:51 AM  Result Value Ref Range   WBC 9.0 3.6 - 11.0 K/uL   RBC 4.24 3.80 - 5.20 MIL/uL   Hemoglobin 13.2 12.0 - 16.0 g/dL   HCT 39.2 35.0 - 47.0 %   MCV 92.3 80.0 - 100.0 fL   MCH 31.1 26.0 - 34.0 pg   MCHC 33.7 32.0 - 36.0 g/dL   RDW 14.7 (H) 11.5 - 14.5 %   Platelets 269 150 - 440 K/uL  Basic metabolic panel     Status: Abnormal   Collection Time: 03/07/15  6:51 AM  Result Value Ref Range   Sodium 135 135 - 145 mmol/L   Potassium 3.9 3.5 - 5.1 mmol/L   Chloride 100 (L) 101 - 111 mmol/L   CO2 24 22 - 32 mmol/L   Glucose, Bld 133 (H) 65 - 99 mg/dL   BUN 9 6 - 20 mg/dL   Creatinine, Ser 0.79 0.44 - 1.00 mg/dL   Calcium 9.4 8.9 - 10.3 mg/dL   GFR calc non Af Amer >60 >60 mL/min   GFR calc Af Amer >60 >60 mL/min    Comment: (NOTE) The eGFR has  been calculated using the CKD EPI equation. This calculation has not been validated in all clinical situations. eGFR's persistently <60 mL/min signify possible Chronic Kidney Disease.    Anion gap 11 5 - 15   Magnesium     Status: Abnormal   Collection Time: 03/07/15  6:51 AM  Result Value Ref Range   Magnesium 1.6 (L) 1.7 - 2.4 mg/dL  Phosphorus     Status: None   Collection Time: 03/07/15  6:51 AM  Result Value Ref Range   Phosphorus 2.8 2.5 - 4.6 mg/dL  Glucose, capillary     Status: Abnormal   Collection Time: 03/07/15  7:20 AM  Result Value Ref Range   Glucose-Capillary 151 (H) 65 - 99 mg/dL  Glucose, capillary     Status: Abnormal   Collection Time: 03/07/15 10:43 AM  Result Value Ref Range   Glucose-Capillary 129 (H) 65 - 99 mg/dL  Glucose, capillary     Status: Abnormal   Collection Time: 03/07/15  5:34 PM  Result Value Ref Range   Glucose-Capillary 122 (H) 65 - 99 mg/dL  Glucose, capillary     Status: Abnormal   Collection Time: 03/07/15  9:49 PM  Result Value Ref Range   Glucose-Capillary 111 (H) 65 - 99 mg/dL  Glucose, capillary     Status: Abnormal   Collection Time: 03/08/15  4:47 AM  Result Value Ref Range   Glucose-Capillary 115 (H) 65 - 99 mg/dL  Basic metabolic panel     Status: Abnormal   Collection Time: 03/08/15  5:37 AM  Result Value Ref Range   Sodium 136 135 - 145 mmol/L   Potassium 4.2 3.5 - 5.1 mmol/L   Chloride 103 101 - 111 mmol/L   CO2 25 22 - 32 mmol/L   Glucose, Bld 115 (H) 65 - 99 mg/dL   BUN 11 6 - 20 mg/dL   Creatinine, Ser 0.76 0.44 - 1.00 mg/dL   Calcium 9.4 8.9 - 10.3 mg/dL   GFR calc non Af Amer >60 >60 mL/min   GFR calc Af Amer >60 >60 mL/min    Comment: (NOTE) The eGFR has been calculated using the CKD EPI equation. This calculation has not been validated in all clinical situations. eGFR's persistently <60 mL/min signify possible Chronic Kidney Disease.    Anion gap 8 5 - 15  Phosphorus     Status: Abnormal   Collection Time: 03/08/15  5:37 AM  Result Value Ref Range   Phosphorus 2.4 (L) 2.5 - 4.6 mg/dL  Magnesium     Status: Abnormal   Collection Time: 03/08/15  5:37 AM  Result Value Ref Range   Magnesium 1.5 (L) 1.7 - 2.4  mg/dL  Glucose, capillary     Status: None   Collection Time: 03/08/15  9:58 AM  Result Value Ref Range   Glucose-Capillary 98 65 - 99 mg/dL   Comment 1 Notify RN     Vitals: Blood pressure 144/57, pulse 88, temperature 97.5 F (36.4 C), temperature source Oral, resp. rate 18, height $RemoveBe'5\' 5"'[id number]$  (1.651 m), weight 76.114 kg (167 lb 12.8 oz), SpO2 96 %.  Risk to Self: Is patient at risk for suicide?: Yes Risk to Others:   Prior Inpatient Therapy:   Prior Outpatient Therapy:    Current Facility-Administered Medications  Medication Dose Route Frequency Provider Last Rate Last Dose  . 0.9 % NaCl with KCl 40 mEq / L  infusion   Intravenous Continuous Wilhelmina Mcardle, MD 50 mL/hr at 03/08/15 1230  50 mL/hr at 03/08/15 1230  . antiseptic oral rinse (CPC / CETYLPYRIDINIUM CHLORIDE 0.05%) solution 7 mL  7 mL Mouth Rinse QID Lance Coon, MD   7 mL at 03/08/15 0513  . calcium-vitamin D (OSCAL WITH D) 500-200 MG-UNIT per tablet 1 tablet  1 tablet Oral BID Dustin Flock, MD   1 tablet at 03/08/15 0936  . chlorhexidine (PERIDEX) 0.12 % solution 15 mL  15 mL Mouth Rinse BID Lance Coon, MD   15 mL at 03/07/15 0800  . colestipol (COLESTID) tablet 2 g  2 g Oral BID Dustin Flock, MD   2 g at 03/08/15 0936  . heparin injection 5,000 Units  5,000 Units Subcutaneous 3 times per day Lance Coon, MD   5,000 Units at 03/08/15 1512  . hydrALAZINE (APRESOLINE) injection 10 mg  10 mg Intravenous Q6H PRN Dustin Flock, MD      . ibuprofen (ADVIL,MOTRIN) tablet 200 mg  200 mg Oral Q6H PRN Dustin Flock, MD      . insulin aspart (novoLOG) injection 0-9 Units  0-9 Units Subcutaneous Q6H Lance Coon, MD   1 Units at 03/07/15 1758  . loratadine (CLARITIN) tablet 10 mg  10 mg Oral Daily Dustin Flock, MD   10 mg at 03/08/15 0936  . magnesium oxide (MAG-OX) tablet 400 mg  400 mg Oral BID Dustin Flock, MD      . mirtazapine (REMERON SOL-TAB) disintegrating tablet 15 mg  15 mg Oral QHS Gonzella Lex, MD      .  pantoprazole (PROTONIX) EC tablet 40 mg  40 mg Oral Daily Dustin Flock, MD   40 mg at 03/08/15 0936  . PARoxetine (PAXIL) tablet 20 mg  20 mg Oral Daily Dustin Flock, MD   20 mg at 03/08/15 0936  . phenol (CHLORASEPTIC) mouth spray 2 spray  2 spray Mouth/Throat Q4H PRN Flora Lipps, MD   2 spray at 03/07/15 1307  . potassium & sodium phosphates (PHOS-NAK) 280-160-250 MG packet 1 packet  1 packet Oral TID WC & HS Dustin Flock, MD   1 packet at 03/08/15 1230  . rosuvastatin (CRESTOR) tablet 10 mg  10 mg Oral QPM Dustin Flock, MD   10 mg at 03/07/15 1654  . sodium chloride 0.9 % injection 3 mL  3 mL Intravenous Q12H Lance Coon, MD   3 mL at 03/08/15 6433    Musculoskeletal: Strength & Muscle Tone: decreased Gait & Station: unable to stand Patient leans: N/A  Psychiatric Specialty Exam: Physical Exam  Constitutional: Vital signs are normal. She appears lethargic.  Neurological: She appears lethargic.  Gen. subjective weakness  Psychiatric: Her speech is delayed. She is slowed. Cognition and memory are impaired. She expresses impulsivity. She exhibits a depressed mood. She expresses suicidal ideation. She exhibits abnormal recent memory.  Patient presents as very withdrawn and subdued. Makes minimal eye contact. Keeps her eyes closed for the most part. Speech is very quiet. Affect flat. Reported his being embarrassed. Still appears to be very depressed    Review of Systems  HENT: Negative.   Eyes: Negative.   Respiratory: Negative.   Cardiovascular: Negative.   Gastrointestinal: Negative.   Musculoskeletal: Negative.   Skin: Negative.   Neurological: Positive for focal weakness and weakness.  Psychiatric/Behavioral: Positive for depression and suicidal ideas. Negative for hallucinations and substance abuse. The patient is nervous/anxious and has insomnia.     Blood pressure 144/57, pulse 88, temperature 97.5 F (36.4 C), temperature source Oral, resp. rate 18, height 5'  5" (1.651  m), weight 76.114 kg (167 lb 12.8 oz), SpO2 96 %.Body mass index is 27.92 kg/(m^2).  General Appearance: Guarded  Eye Contact::  Minimal  Speech:  Slow  Volume:  Patient essentially whispers for the whole interview.  Mood:  Depressed  Affect:  Depressed  Thought Process:  Linear  Orientation:  Full (Time, Place, and Person)  Thought Content:  Negative  Suicidal Thoughts:  Yes.  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 My interaction with her was still slightly limited in that it is hard to tell if she might actually still be having some degree of psychotic negativity. The history is quite interesting in that she did not have a past history of major depression but had an episode of something that sounds possibly manic like late last year before this depression and that the family sees there being some connection to her having her gastric and discontinued. In any case right now she is severely depressed and still dangerous to herself. I think I will probably be filing involuntary  commitment papers on her. I'm fairly certain that we will need to have her go to an inpatient psychiatric unit. I will ask social work to go ahead and start looking towards that. I'm not sure when medicine will feel like she is ready for discharge. Patient's weakness and lack of appetite are probably mainly manifestations of her depression still. Encouraged patient to please eat and drink well and try and work on her strength. Discuss the situation with the family particularly the husband. I'm going to start her on 15 mg of mirtazapine tonight for depression. Continue the Paxil. I will follow up regularly.  Plan:  Recommend psychiatric Inpatient admission when medically cleared. Supportive therapy provided about ongoing stressors. Disposition: Continue treatment for now. Anticipate probable referral to geriatric unit.  John Clapacs 03/08/2015 5:20 PM

## 2015-03-08 NOTE — Care Management (Signed)
Presented and admitted to [facility] with the diagnosis of acute respiratory failure/overdose of medications. Lives with husband, Josph Macho [phone]). Sees Dr. Loney Hering. Last seen 02/21/15.  Transferred to 1C   03/07/15.  NPO Speech evaluation. Psych evaluation in progress.  Sitter at the bedside. Shelbie Ammons RN MSN Care Management [phone]

## 2015-03-08 NOTE — Progress Notes (Signed)
[place] at [facility]                                                                                                                                                                                            Patient Demographics   Paige Dougherty, is a 81 y.o. female, DOB - [birth year], KZL:[id number]  Admit date - 03/05/2015   Admitting Physician Lance Coon, MD  Outpatient Primary MD for the patient is BABAOFF, Caryl Bis, MD   LOS - 3  Subjective: Patient more awake. Did not eat much yesterday was too weak to do a swallow eval     Review of Systems:    CONSTITUTIONAL: No documented fever. No fatigue, weakness. No weight gain, no weight loss.  EYES: No blurry or double vision.  ENT: No tinnitus. No postnasal drip. No redness of the oropharynx.  RESPIRATORY: No cough, no wheeze, no hemoptysis. No dyspnea.  CARDIOVASCULAR: No chest pain. No orthopnea. No palpitations. No syncope.  GASTROINTESTINAL: No nausea, no vomiting or diarrhea. No abdominal pain. No melena or hematochezia.  GENITOURINARY:  No urgency. No frequency. No dysuria. No hematuria. No obstructive symptoms. No discharge. No pain. No significant abnormal bleeding ENDOCRINE: No polyuria or nocturia. No heat or cold intolerance.  HEMATOLOGY: No anemia. No bruising. No bleeding. No purpura. No petechiae INTEGUMENTARY: No rashes. No lesions.  MUSCULOSKELETAL: No arthritis. No swelling. No gout.  NEUROLOGIC: No numbness, tingling, or ataxia. No seizure-type activity.  PSYCHIATRIC: Depressed  Vitals:   Filed Vitals:   03/07/15 1536 03/07/15 2125 03/08/15 0454 03/08/15 0527  BP: 141/53 142/61  125/46  Pulse: 97 87  82  Temp: 97.9 F (36.6 C) 98 F (36.7 C)  97.7 F (36.5 C)  TempSrc: Oral Oral  Oral  Resp: 18 20  18   Height:      Weight:   76.114 kg (167 lb 12.8 oz)   SpO2: 97% 98%  92%    Wt Readings from Last 3 Encounters:  03/08/15 76.114 kg (167 lb 12.8 oz)  02/27/15 77.1 kg  (169 lb 15.6 oz)  02/03/15 78 kg (171 lb 15.3 oz)     Intake/Output Summary (Last 24 hours) at 03/08/15 1212 Last data filed at 03/08/15 0945  Gross per 24 hour  Intake      0 ml  Output    800 ml  Net   -800 ml    Physical Exam:   GENERAL: Appears very weak no acute distress HEAD, EYES, EARS, NOSE AND THROAT: Atraumatic, normocephalic. Extraocular muscles are intact. Pupils equal and reactive to light. Sclerae anicteric. No conjunctival injection.  No oro-pharyngeal erythema.  NECK: Supple. There is no jugular venous distention. No bruits, no lymphadenopathy, no thyromegaly.  HEART: Regular rate and rhythm,. No murmurs, no rubs, no clicks.  LUNGS: Clear to auscultation bilaterally. No rales or rhonchi. No wheezes.  ABDOMEN: Soft, flat, nontender, nondistended. Has good bowel sounds. No hepatosplenomegaly appreciated.  EXTREMITIES: No evidence of any cyanosis, clubbing, or peripheral edema.  +2 pedal and radial pulses bilaterally.  NEUROLOGIC: Able to follow commands cranial nerves II-12 grossly intact strength 5 out of 5 in all 4 extremities SKIN: Moist and warm with no rashes appreciated.  Psych: Not anxious, LN: No inguinal LN enlargement    Antibiotics   Anti-infectives    None      Medications   Scheduled Meds: . antiseptic oral rinse  7 mL Mouth Rinse QID  . calcium-vitamin D  1 tablet Oral BID  . chlorhexidine  15 mL Mouth Rinse BID  . colestipol  2 g Oral BID  . heparin  5,000 Units Subcutaneous 3 times per day  . insulin aspart  0-9 Units Subcutaneous Q6H  . loratadine  10 mg Oral Daily  . pantoprazole  40 mg Oral Daily  . PARoxetine  20 mg Oral Daily  . potassium & sodium phosphates  1 packet Oral TID WC & HS  . rosuvastatin  10 mg Oral QPM  . sodium chloride  3 mL Intravenous Q12H   Continuous Infusions: . 0.9 % NaCl with KCl 40 mEq / L 50 mL/hr (03/06/15 1606)   PRN Meds:.   Data Review:   Micro Results Recent Results (from the past 240 hour(s))   MRSA PCR Screening     Status: None   Collection Time: 03/05/15  4:33 PM  Result Value Ref Range Status   MRSA by PCR NEGATIVE NEGATIVE Final    Comment:        The GeneXpert MRSA Assay (FDA approved for NASAL specimens only), is one component of a comprehensive MRSA colonization surveillance program. It is not intended to diagnose MRSA infection nor to guide or monitor treatment for MRSA infections.     Radiology Reports Dg Abd 1 View  03/07/2015   CLINICAL DATA:  Distended abdomen.  EXAM: ABDOMEN - 1 VIEW  COMPARISON:  03/05/2015.  FINDINGS: NG tube in stable position. Interim resolution of gastric distention. No bowel distention. No free air. Pelvic calcifications consistent phleboliths. Lumbar scoliosis concave right diffuse degenerative change present. Tiny sclerotic density left proximal femur most likely a bone island.  IMPRESSION: 1. NG tube noted with tip in good anatomic position. 2. Interim resolution of gastric distention. No bowel distention noted.   Electronically Signed   By: Marcello Moores  Register   On: 03/07/2015 07:23   Dg Abd 1 View  03/05/2015   CLINICAL DATA:  Readjustment of NG tube  EXAM: ABDOMEN - 1 VIEW  COMPARISON:  03/05/2015  FINDINGS: Enteric tube terminates in the distal gastric body.  Mild gastric distention.  IMPRESSION: Enteric tube terminates in the distal gastric body.   Electronically Signed   By: Julian Hy M.D.   On: 03/05/2015 18:40   Dg Abd 1 View  03/05/2015   CLINICAL DATA:  Overdose on multiple meds.  NG tube placement.  EXAM: ABDOMEN - 1 VIEW  COMPARISON:  None.  FINDINGS: Nasogastric tube is present with tip and side-port over the right upper quadrant likely over the distal stomach/ proximal duodenum.  Bowel gas pattern is nonobstructive. There is no free peritoneal air. There  are mild degenerative changes of the spine. Lung bases are within normal.  IMPRESSION: Nonobstructive bowel gas pattern.  Nasogastric tube with tip over the right upper  quadrant likely over the distal stomach/ proximal duodenum.   Electronically Signed   By: Marin Olp M.D.   On: 03/05/2015 16:46   Dg Chest Port 1 View  03/05/2015   CLINICAL DATA:  Breast cancer.  Overdose.  Intubation.  EXAM: PORTABLE CHEST - 1 VIEW  COMPARISON:  None.  FINDINGS: Endotracheal tube 2 cm from carina. Normal cardiac silhouette. There is mild pulmonary edema pattern centrally. No focal infiltrate or pneumonitis identified. No pneumothorax.  IMPRESSION: 1. Endotracheal tube 2 cm from carina. 2. Mild central venous congestion / pulmonary edema.   Electronically Signed   By: Suzy Bouchard M.D.   On: 03/05/2015 13:35     CBC  Recent Labs Lab 03/05/15 1218 03/06/15 0400 03/07/15 0651  WBC 6.4 7.9 9.0  HGB 12.9 12.1 13.2  HCT 37.7 35.2 39.2  PLT 257 240 269  MCV 91.2 91.1 92.3  MCH 31.1 31.4 31.1  MCHC 34.1 34.5 33.7  RDW 14.1 14.4 14.7*    Chemistries   Recent Labs Lab 03/05/15 1218 03/06/15 0400 03/07/15 0651 03/08/15 0537  NA 131* 132* 135 136  K 3.3* 3.4* 3.9 4.2  CL 95* 102 100* 103  CO2 27 20* 24 25  GLUCOSE 101* 88 133* 115*  BUN 13 15 9 11   CREATININE 0.61 1.13* 0.79 0.76  CALCIUM 9.4 8.8* 9.4 9.4  MG  --  1.3* 1.6* 1.5*  AST 26  --   --   --   ALT 25  --   --   --   ALKPHOS 71  --   --   --   BILITOT 0.5  --   --   --    ------------------------------------------------------------------------------------------------------------------ estimated creatinine clearance is 57.2 mL/min (by C-G formula based on Cr of 0.76). ------------------------------------------------------------------------------------------------------------------ No results for input(s): HGBA1C in the last 72 hours. ------------------------------------------------------------------------------------------------------------------ No results for input(s): CHOL, HDL, LDLCALC, TRIG, CHOLHDL, LDLDIRECT in the last 72  hours. ------------------------------------------------------------------------------------------------------------------ No results for input(s): TSH, T4TOTAL, T3FREE, THYROIDAB in the last 72 hours.  Invalid input(s): FREET3 ------------------------------------------------------------------------------------------------------------------ No results for input(s): VITAMINB12, FOLATE, FERRITIN, TIBC, IRON, RETICCTPCT in the last 72 hours.  Coagulation profile No results for input(s): INR, PROTIME in the last 168 hours.  No results for input(s): DDIMER in the last 72 hours.  Cardiac Enzymes No results for input(s): CKMB, TROPONINI, MYOGLOBIN in the last 168 hours.  Invalid input(s): CK ------------------------------------------------------------------------------------------------------------------ Invalid input(s): [place]    1.  Acute respiratory failure with hypoxemia due to drug overdose status post extubation on room air 2. Gastric distention now resolved diet as tolerated when more awake 3. Anxiety Psychiatry evaluation to be redone today due to patient being drowsy yesterday 4.  DepressionPsychiatry evaluation 5.  GERD continue PPIs  6. Cancer of breast oncology consult has been placed per admitting doctor  7. Benign essential HTN When necessary hydralazine  8. Overdose of benzodiazepine  9. Overdose of antidepressant 10.  Overdose of opiate or related narcotic   11. Suicide attempt  psych input appreciated      Code Status Orders        Start     Ordered   03/05/15 1624  Full code   Continuous     03/05/15 1623  Consults pulmonary  DVT Prophylaxis heparin  Lab Results  Component Value Date   PLT 269 03/07/2015     Time Spent  35 minutes  Diara Chaudhari, [facility] M.D on 03/08/2015 at 12:12 PM  Between 7am to 6pm - Pager - [phone]  After 6pm go to [email or web address] - password [email or web address] [place] [facility]   Office   [phone]

## 2015-03-08 NOTE — Progress Notes (Signed)
[place] at [facility]                                                                                                                                                                                            Patient Demographics   Paige Dougherty, is a 81 y.o. female, DOB - 08/08/1932, PHX:[id number]  Admit date - 03/05/2015   Admitting Physician Lance Coon, MD  Outpatient Primary MD for the patient is BABAOFF, Caryl Bis, MD   LOS - 3  Subjective: Extubated earlier very weak. Denies any complaints     Review of Systems:   Limited due to patient being very sleepy complains of weakness, denies any chest pain shortness of breath and palpitations no nausea vomiting or diarrhea  Vitals:   Filed Vitals:   03/07/15 1536 03/07/15 2125 03/08/15 0454 03/08/15 0527  BP: 141/53 142/61  125/46  Pulse: 97 87  82  Temp: 97.9 F (36.6 C) 98 F (36.7 C)  97.7 F (36.5 C)  TempSrc: Oral Oral  Oral  Resp: 18 20  18   Height:      Weight:   76.114 kg (167 lb 12.8 oz)   SpO2: 97% 98%  92%    Wt Readings from Last 3 Encounters:  03/08/15 76.114 kg (167 lb 12.8 oz)  02/27/15 77.1 kg (169 lb 15.6 oz)  02/03/15 78 kg (171 lb 15.3 oz)     Intake/Output Summary (Last 24 hours) at 03/08/15 1208 Last data filed at 03/08/15 0945  Gross per 24 hour  Intake      0 ml  Output    800 ml  Net   -800 ml    Physical Exam:   GENERAL: Appears very weak no acute distress HEAD, EYES, EARS, NOSE AND THROAT: Atraumatic, normocephalic. Extraocular muscles are intact. Pupils equal and reactive to light. Sclerae anicteric. No conjunctival injection. No oro-pharyngeal erythema.  NECK: Supple. There is no jugular venous distention. No bruits, no lymphadenopathy, no thyromegaly.  HEART: Regular rate and rhythm, tachycardic. No murmurs, no rubs, no clicks.  LUNGS: Clear to auscultation bilaterally. No rales or rhonchi. No wheezes.  ABDOMEN: Soft, flat, nontender,  nondistended. Has good bowel sounds. No hepatosplenomegaly appreciated.  EXTREMITIES: No evidence of any cyanosis, clubbing, or peripheral edema.  +2 pedal and radial pulses bilaterally.  NEUROLOGIC: Able to follow commands cranial nerves II-12 grossly intact strength 5 out of 5 in all 4 extremities SKIN: Moist and warm with no rashes appreciated.  Psych: Not anxious, LN: No inguinal LN enlargement    Antibiotics   Anti-infectives    None  Medications   Scheduled Meds: . antiseptic oral rinse  7 mL Mouth Rinse QID  . calcium-vitamin D  1 tablet Oral BID  . chlorhexidine  15 mL Mouth Rinse BID  . colestipol  2 g Oral BID  . heparin  5,000 Units Subcutaneous 3 times per day  . insulin aspart  0-9 Units Subcutaneous Q6H  . loratadine  10 mg Oral Daily  . pantoprazole  40 mg Oral Daily  . PARoxetine  20 mg Oral Daily  . potassium & sodium phosphates  1 packet Oral TID WC & HS  . rosuvastatin  10 mg Oral QPM  . sodium chloride  3 mL Intravenous Q12H   Continuous Infusions: . 0.9 % NaCl with KCl 40 mEq / L 50 mL/hr (03/06/15 1606)   PRN Meds:.   Data Review:   Micro Results Recent Results (from the past 240 hour(s))  MRSA PCR Screening     Status: None   Collection Time: 03/05/15  4:33 PM  Result Value Ref Range Status   MRSA by PCR NEGATIVE NEGATIVE Final    Comment:        The GeneXpert MRSA Assay (FDA approved for NASAL specimens only), is one component of a comprehensive MRSA colonization surveillance program. It is not intended to diagnose MRSA infection nor to guide or monitor treatment for MRSA infections.     Radiology Reports Dg Abd 1 View  03/07/2015   CLINICAL DATA:  Distended abdomen.  EXAM: ABDOMEN - 1 VIEW  COMPARISON:  03/05/2015.  FINDINGS: NG tube in stable position. Interim resolution of gastric distention. No bowel distention. No free air. Pelvic calcifications consistent phleboliths. Lumbar scoliosis concave right diffuse degenerative  change present. Tiny sclerotic density left proximal femur most likely a bone island.  IMPRESSION: 1. NG tube noted with tip in good anatomic position. 2. Interim resolution of gastric distention. No bowel distention noted.   Electronically Signed   By: Marcello Moores  Register   On: 03/07/2015 07:23   Dg Abd 1 View  03/05/2015   CLINICAL DATA:  Readjustment of NG tube  EXAM: ABDOMEN - 1 VIEW  COMPARISON:  03/05/2015  FINDINGS: Enteric tube terminates in the distal gastric body.  Mild gastric distention.  IMPRESSION: Enteric tube terminates in the distal gastric body.   Electronically Signed   By: Julian Hy M.D.   On: 03/05/2015 18:40   Dg Abd 1 View  03/05/2015   CLINICAL DATA:  Overdose on multiple meds.  NG tube placement.  EXAM: ABDOMEN - 1 VIEW  COMPARISON:  None.  FINDINGS: Nasogastric tube is present with tip and side-port over the right upper quadrant likely over the distal stomach/ proximal duodenum.  Bowel gas pattern is nonobstructive. There is no free peritoneal air. There are mild degenerative changes of the spine. Lung bases are within normal.  IMPRESSION: Nonobstructive bowel gas pattern.  Nasogastric tube with tip over the right upper quadrant likely over the distal stomach/ proximal duodenum.   Electronically Signed   By: Marin Olp M.D.   On: 03/05/2015 16:46   Dg Chest Port 1 View  03/05/2015   CLINICAL DATA:  Breast cancer.  Overdose.  Intubation.  EXAM: PORTABLE CHEST - 1 VIEW  COMPARISON:  None.  FINDINGS: Endotracheal tube 2 cm from carina. Normal cardiac silhouette. There is mild pulmonary edema pattern centrally. No focal infiltrate or pneumonitis identified. No pneumothorax.  IMPRESSION: 1. Endotracheal tube 2 cm from carina. 2. Mild central venous congestion / pulmonary edema.  Electronically Signed   By: Suzy Bouchard M.D.   On: 03/05/2015 13:35     CBC  Recent Labs Lab 03/05/15 1218 03/06/15 0400 03/07/15 0651  WBC 6.4 7.9 9.0  HGB 12.9 12.1 13.2  HCT 37.7  35.2 39.2  PLT 257 240 269  MCV 91.2 91.1 92.3  MCH 31.1 31.4 31.1  MCHC 34.1 34.5 33.7  RDW 14.1 14.4 14.7*    Chemistries   Recent Labs Lab 03/05/15 1218 03/06/15 0400 03/07/15 0651 03/08/15 0537  NA 131* 132* 135 136  K 3.3* 3.4* 3.9 4.2  CL 95* 102 100* 103  CO2 27 20* 24 25  GLUCOSE 101* 88 133* 115*  BUN 13 15 9 11   CREATININE 0.61 1.13* 0.79 0.76  CALCIUM 9.4 8.8* 9.4 9.4  MG  --  1.3* 1.6* 1.5*  AST 26  --   --   --   ALT 25  --   --   --   ALKPHOS 71  --   --   --   BILITOT 0.5  --   --   --    ------------------------------------------------------------------------------------------------------------------ estimated creatinine clearance is 57.2 mL/min (by C-G formula based on Cr of 0.76). ------------------------------------------------------------------------------------------------------------------ No results for input(s): HGBA1C in the last 72 hours. ------------------------------------------------------------------------------------------------------------------ No results for input(s): CHOL, HDL, LDLCALC, TRIG, CHOLHDL, LDLDIRECT in the last 72 hours. ------------------------------------------------------------------------------------------------------------------ No results for input(s): TSH, T4TOTAL, T3FREE, THYROIDAB in the last 72 hours.  Invalid input(s): FREET3 ------------------------------------------------------------------------------------------------------------------ No results for input(s): VITAMINB12, FOLATE, FERRITIN, TIBC, IRON, RETICCTPCT in the last 72 hours.  Coagulation profile No results for input(s): INR, PROTIME in the last 168 hours.  No results for input(s): DDIMER in the last 72 hours.  Cardiac Enzymes No results for input(s): CKMB, TROPONINI, MYOGLOBIN in the last 168 hours.  Invalid input(s): CK ------------------------------------------------------------------------------------------------------------------ Invalid  input(s): [place]    1.  Acute respiratory failure with hypoxemia due to drug overdose status post extubation 2. Gastric distention now resolved, advance diet as tolerated 3. Anxiety Psychiatry evaluation pending will be able to evaluate once patient extubated 4.  DepressionPsychiatry evaluation 5.  GERD continue PPIs  6. Cancer of breast oncology consult has been placed per admitting doctor  7. Benign essential HTN When necessary hydralazine  8. Overdose of benzodiazepine  9. Overdose of antidepressant 10.  Overdose of opiate or related narcotic   11. Suicide attempt psychiatric evaluation pending      Code Status Orders        Start     Ordered   03/05/15 1624  Full code   Continuous     03/05/15 1623           Consults pulmonary  DVT Prophylaxis heparin  Lab Results  Component Value Date   PLT 269 03/07/2015     Time Spent  35 minutes  Dustin Flock M.D on 03/08/2015 at 12:08 PM  Between 7am to 6pm - Pager - [phone]  After 6pm go to [email or web address] - password [email or web address] [place] [facility]   Office  [phone]

## 2015-03-08 NOTE — Plan of Care (Signed)
Problem: SLP Dysphagia Goals Goal: Misc Dysphagia Goal Pt will safely tolerate po diet of least restrictive consistency w/ no overt s/s of aspiration noted by Staff/pt/family x3 sessions.    

## 2015-03-08 NOTE — Progress Notes (Signed)
PHARMACY - Electrolyte replacement supplementation   Pharmacy Consult for Electrolyte Supplementation    Allergies  Allergen Reactions  . Avapro [Irbesartan] Other (See Comments)    unknown  . Azithromycin Other (See Comments)    Extreme vaginal burning. unknown  . Clindamycin Other (See Comments)    Vaginal burning  . Duloxetine Hcl Other (See Comments)    Hyperactivity.  . Lipitor [Atorvastatin] Other (See Comments)    "muscle aches"  . Metronidazole Other (See Comments)  . Penicillins   . Prednisone Other (See Comments)    Dizziness/double vision  . Procaine Other (See Comments)    tremors  . Procaine Hcl     tremors  . Eggs Or Egg-Derived Products Other (See Comments)    Other Reaction: Not Assessed  . Other Other (See Comments) and Anxiety    Novacaine weakness  . Statins Rash    Arthralgias.  . Sulfa Antibiotics Rash    Vague history of a sulfa allergy, but does not remember the reaction.  . Tetracycline Rash    Patient Measurements: Height: 5\' 5"  (165.1 cm) Weight: 167 lb 12.8 oz (76.114 kg) IBW/kg (Calculated) : 57 Adjusted Body Weight:   Vital Signs: Temp: 97.7 F (36.5 C) (08/03 0527) Temp Source: Oral (08/03 0527) BP: 125/46 mmHg (08/03 0527) Pulse Rate: 82 (08/03 0527) Intake/Output from previous day: 08/02 0701 - 08/03 0700 In: -  Out: 900 [Urine:900] Intake/Output from this shift:   Vent settings for last 24 hours:    Labs:  Recent Labs  03/05/15 1218 03/06/15 0400 03/07/15 0651 03/08/15 0537  WBC 6.4 7.9 9.0  --   HGB 12.9 12.1 13.2  --   HCT 37.7 35.2 39.2  --   PLT 257 240 269  --   CREATININE 0.61 1.13* 0.79 0.76  MG  --  1.3* 1.6* 1.5*  PHOS  --   --  2.8 2.4*  ALBUMIN 3.4*  --   --   --   PROT 6.1*  --   --   --   AST 26  --   --   --   ALT 25  --   --   --   ALKPHOS 71  --   --   --   BILITOT 0.5  --   --   --    Estimated Creatinine Clearance: 57.2 mL/min (by C-G formula based on Cr of 0.76).   Recent Labs  03/07/15 1734 03/07/15 2149 03/08/15 0447  GLUCAP 122* 111* 115*    Microbiology: Recent Results (from the past 720 hour(s))  MRSA PCR Screening     Status: None   Collection Time: 03/05/15  4:33 PM  Result Value Ref Range Status   MRSA by PCR NEGATIVE NEGATIVE Final    Comment:        The GeneXpert MRSA Assay (FDA approved for NASAL specimens only), is one component of a comprehensive MRSA colonization surveillance program. It is not intended to diagnose MRSA infection nor to guide or monitor treatment for MRSA infections.     Medications:  Infusions:  . 0.9 % NaCl with KCl 40 mEq / L 50 mL/hr (03/06/15 1606)    Assessment: Electrolytes within normal limits except magnesium.   Mag: 1.5 and Phos: 2.4   Goal of Therapy:  Electrolyte values wnl  Plan:  Will give Mag sulfate 4 g IV x 1 and will also give Phos Nak 1 packet tidac x 3 doses. Will order labs in am.  Pharmacy will continue to follow.   Laiklyn Pilkenton D 03/08/2015,9:09 AM

## 2015-03-08 NOTE — Evaluation (Signed)
Physical Therapy Evaluation Patient Details Name: Paige Dougherty MRN: [id number] DOB: 25-Aug-1932 Today's Date: 03/08/2015   History of Present Illness  Pt is a 81 year-old female who was admitted to the hospital for medication overdose suspected to be suicide attempt. Pt has a recent diagnosis of breast cancer and had become depressed. Pt was intubated on 03/05/15 for respiratory failure and was extubated on 03/07/15.   Clinical Impression  Pt presents with hx diverticulitis, GERD, esophageal stricture, depression, anxiety, barrett's esophagus, IBS, adenomatous colon polyp, DM, HTN, hyperlipidemia, arthritis, urinary incontinence, fibrocystic breast disease, chronic headaches, and breast cancer. Examination reveals that pt performs bed mobility and transfers with max assist +2. She ambulates with mod assist +2. She has generalized weakness and decreased activity tolerance. Pt will benefit from skilled PT in order to address those deficits so that she can return home safely. Pt's O2 sat never fell below 93% before, during, or after ambulation.     Follow Up Recommendations SNF    Equipment Recommendations  Rolling walker with 5" wheels    Recommendations for Other Services       Precautions / Restrictions Precautions Precautions: Fall Restrictions Weight Bearing Restrictions: No      Mobility  Bed Mobility Overal bed mobility: Needs Assistance;+2 for physical assistance Bed Mobility: Supine to Sit     Supine to sit: Max assist;+2 for physical assistance     General bed mobility comments: Pt needs heavy trunk support and guidance of LE in order to get to EOB. Requires cueing for hand placement to assist therapist with movement  Transfers Overall transfer level: Needs assistance Equipment used: Rolling walker (2 wheeled) Transfers: Sit to/from Stand Sit to Stand: Max assist;+2 physical assistance         General transfer comment: Pt requires heavy trunk support and verbal cues  for hand placement on walker. She requires tactile cues on posterior hip in order to get her hips under her to come into full standing (Tends to slump over)  Ambulation/Gait Ambulation/Gait assistance: +2 physical assistance;Mod assist Ambulation Distance (Feet): 3 Feet Assistive device: Rolling walker (2 wheeled) Gait Pattern/deviations: Step-to pattern;Decreased step length - right;Decreased step length - left;Decreased dorsiflexion - right;Decreased dorsiflexion - left;Steppage Gait velocity: decreased Gait velocity interpretation: Below normal speed for age/gender General Gait Details: Pt requires assist for guidance of RW as well weight shifting in order to get her to take steps laterally to get to her recliner. She needs cues to look up and lift her chest and she tends to lean posteriorly   Stairs            Wheelchair Mobility    Modified Rankin (Stroke Patients Only)       Balance Overall balance assessment: History of Falls                                           Pertinent Vitals/Pain Pain Assessment: No/denies pain    Home Living Family/patient expects to be discharged to:: Private residence Living Arrangements: Spouse/significant other Available Help at Discharge: Family;Available 24 hours/day (Husband) Type of Home: House Home Access: Stairs to enter Entrance Stairs-Rails: Right Entrance Stairs-Number of Steps: 4 Home Layout: One level        Prior Function Level of Independence: Independent         Comments: Pt was driving, indep with ADLs, and ambulating community distances  Hand Dominance        Extremity/Trunk Assessment   Upper Extremity Assessment: Generalized weakness           Lower Extremity Assessment: Generalized weakness         Communication   Communication: No difficulties  Cognition Arousal/Alertness: Lethargic Behavior During Therapy: Flat affect Overall Cognitive Status: Within Functional  Limits for tasks assessed                      General Comments      Exercises Other Exercises Other Exercises: Pt performed bilateral LE therapeutic exercise x10 reps with supervision for proper technique. Exercises performed: LAQ,.quad sets, ankle pumps, glute sets, hip abd/add, and heel slides      Assessment/Plan    PT Assessment Patient needs continued PT services  PT Diagnosis Difficulty walking;Abnormality of gait;Generalized weakness   PT Problem List Decreased strength;Decreased range of motion;Decreased activity tolerance;Decreased balance;Decreased mobility;Decreased knowledge of use of DME;Decreased safety awareness;Decreased knowledge of precautions  PT Treatment Interventions DME instruction;Gait training;Stair training;Functional mobility training;Therapeutic activities;Balance training;Therapeutic exercise;Neuromuscular re-education;Cognitive remediation;Patient/family education   PT Goals (Current goals can be found in the Care Plan section) Acute Rehab PT Goals Patient Stated Goal: To go home (open to short term rehab if needed) PT Goal Formulation: With patient Time For Goal Achievement: 03/22/15 Potential to Achieve Goals: Good    Frequency Min 2X/week   Barriers to discharge        Co-evaluation               End of Session Equipment Utilized During Treatment: Gait belt Activity Tolerance: Patient limited by lethargy;Patient limited by fatigue Patient left: in chair;with call bell/phone within reach;with nursing/sitter in room;with SCD's reapplied;with family/visitor present           Time: 6256-3893 PT Time Calculation (min) (ACUTE ONLY): 28 min   Charges:         PT G CodesJanyth Contes 2015/04/05, 12:05 PM  Janyth Contes, SPT. [phone]

## 2015-03-08 NOTE — Plan of Care (Signed)
Problem: Discharge Progression Outcomes Goal: Other Discharge Outcomes/Goals Plan of care progress to goals: 1. No c/o pain. Pt resting in bed. Up in chair for the afternoon. 2. Hemodynamically:               -VSS, afebrile, IVF infusing             -Diet advanced to DYS I w/ specific food selected by speech therapy. Tolerated well, poor appetite though. 3. Safety sitter at bedside. Pt has flat affect, no verbal suicidal ideations. Husband at bedside as well. 4. +2 max assist to [facility] while pt completely awake. Otherwise bedpan utilized.

## 2015-03-08 NOTE — Progress Notes (Signed)
Pt more alert & able to tolerate applesauce this morning w/ meds crushed. Speech Therapy aware.

## 2015-03-08 NOTE — Progress Notes (Signed)
Speech Language Pathology Treatment: Dysphagia  Patient Details Name: Paige Dougherty MRN: [id number] DOB: 1933/06/19 Today's Date: 03/08/2015 Time: 9643-8381 SLP Time Calculation (min) (ACUTE ONLY): 49 min  Assessment / Plan / Recommendation Clinical Impression  Pt presented w/ min. less drowsiness and sleepiness today during session. Pt sitting in chair after PT session w/ Husband present. She tolerated trials of single ice chips, thin liquids via cup, and purees w/ no overt s/s of aspiration noted; clear vocal quality noted b/t trials when assessed. Pt was able to assist in feeding self but needed moderate assistance d/t weakness of bilat. UEs. No oral phase deficits noted w/ the trials given. Rec. these consistencies at this time for easier oral intake and swallowing as pt continues to c/o min. discomfort when swallowing post the oral extubation(pt stated the discomfort was less today vs the soreness yesterday. Pt's vocal quality is c/b low volume but less raspiness than yesterday(post extubation). ST will continue to f/u w/ trials to upgrade diet consistency next 1-2 days. Husband and pt agreed.   HPI Other Pertinent Information: Pt is a 81 y.o. female who presents with acute respiratory failure due to overdose with multiple medications. Pt is currently NPO d/t recent oral extubation and overt s/s of aspiration w/ po trials yesterday during BSE. Pt is tolerating extubation, however, exhibits moderate discomfort when coughing and vocal quality is declined in status. Pt remains drowsy/sleepy but awakens w/ verbal cues and participates in therapy.     Pertinent Vitals Pain Assessment: No/denies pain  SLP Plan  Continue with current plan of care    Recommendations Diet recommendations: Dysphagia 1 (puree);Thin liquid Liquids provided via: Cup Medication Administration: Whole meds with puree (crush only if nec/able) Supervision: Full supervision/cueing for compensatory strategies;Staff to assist with  self feeding Compensations: Slow rate;Small sips/bites Postural Changes and/or Swallow Maneuvers: Seated upright 90 degrees (support cup for drinking - pt to hold cup)              General recommendations: Rehab consult Oral Care Recommendations: Oral care BID;Oral care before and after PO;Staff/trained caregiver to provide oral care Follow up Recommendations: Skilled Nursing facility (TBD) Plan: Continue with current plan of care    [place], [place], CCC-SLP  Paige Dougherty 03/08/2015, 12:31 PM

## 2015-03-08 NOTE — Plan of Care (Signed)
Problem: Discharge Progression Outcomes Goal: Discharge plan in place and appropriate Individualization: Pt prefers to be called Paige Dougherty who lives at home with her husband.  Hx DM, Anxiety, Depression, HTN, recent diagnosis breast ca (in February). High fall risk. Safety sitter at bedside. Bed alarm on.   Plan of care progress to goals: 1. Pain:  Patient without complaints of pain this shift.  Resting in bed and still lethargic. 2. Hemodynamically: Per speech consult, "pt may have PLEASURE Ice Chips(single chips) and PLEASURE small bites of Applesauce following aspiration precautions as long as no overt s/s of aspiration noted."  Meds cut in small pieces and given with small amounts of applesauce.  Patient immediately threw up all medication and applesauce after administration.  She is now NPO.  NS 40K infusing at 50 mL/hr.   Patient afebrile and vss this shift.  BP 142/61 mmHg  Pulse 87  Temp(Src) 98 F (36.7 C) (Oral)  Resp 20  Ht 5\' 5"  (1.651 m)  Wt 173 lb 15.1 oz (78.9 kg)  BMI 28.95 kg/m2  SpO2 98% 3. Complications:  Safety sitter at bedside. Pt has flat affect, no verbal suicidal ideations.  4. Activity:  Patient in bed this shift.  She remains lethargic so bedpan used instead of BSC.

## 2015-03-08 NOTE — Progress Notes (Signed)
Nutrition Follow-up  INTERVENTION:   Meals and Snacks: Cater to patient preferences; SLP following Medical Food Supplement Therapy: will recommend Mighty Shakes on meal trays TID for added nutrition (each shake provides 300kcals and 9g protein)   NUTRITION DIAGNOSIS:   Inadequate oral intake related to acute illness as evidenced by NPO status; improving with diet advancement  GOAL:   Patient will meet greater than or equal to 90% of their needs; ongoing  MONITOR:    (Energy Intake, Anthropometrics, Digestive System, Electrolyte/Renal Profile, Glucose Profile, Pulmonary)   ASSESSMENT:    Pt s/p extubation, SLP evaluating for swallowing safety this am on visit.  Diet Order:  DIET - DYS 1 Room service appropriate?: Yes with Assist; Fluid consistency:: Thin    Current Nutrition: Pt diet order just advanced   Gastrointestinal Profile: Last BM: 03/08/2015   Medications: NS with KCl at 49mL/hr, Protonix, K-Phos, Mag-Ox, Novolog, Calcium-vitamin D  Electrolyte/Renal Profile and Glucose Profile:   Recent Labs Lab 03/06/15 0400 03/07/15 0651 03/08/15 0537  NA 132* 135 136  K 3.4* 3.9 4.2  CL 102 100* 103  CO2 20* 24 25  BUN 15 9 11   CREATININE 1.13* 0.79 0.76  CALCIUM 8.8* 9.4 9.4  MG 1.3* 1.6* 1.5*  PHOS  --  2.8 2.4*  GLUCOSE 88 133* 115*   Protein Profile:  Recent Labs Lab 03/05/15 1218  ALBUMIN 3.4*     Weight Trend since Admission: Filed Weights   03/05/15 1203 03/06/15 0600 03/08/15 0454  Weight: 181 lb 10.5 oz (82.399 kg) 173 lb 15.1 oz (78.9 kg) 167 lb 12.8 oz (76.114 kg)     Skin:  Reviewed, no issues   Height:   Ht Readings from Last 1 Encounters:  03/05/15 5\' 5"  (1.651 m)    Weight:   Wt Readings from Last 1 Encounters:  03/08/15 167 lb 12.8 oz (76.114 kg)    BMI:  Body mass index is 27.92 kg/(m^2).  Estimated Nutritional Needs:   Kcal:  1371-1619 kcals (BEE 1038, 1.2 AF, 1.1-1.3 IF) using IBW 56.8 kg  Protein:  65-86 g  (1.2-1.5 g/kg)   Fluid:  1425-1710 mL (25-30 ml/kg)   EDUCATION NEEDS:   No education needs identified at this time  [place], RD, LDN Pager [phone]

## 2015-03-09 DIAGNOSIS — F322 Major depressive disorder, single episode, severe without psychotic features: Secondary | ICD-10-CM | POA: Insufficient documentation

## 2015-03-09 LAB — BASIC METABOLIC PANEL
Anion gap: 6 (ref 5–15)
BUN: 12 mg/dL (ref 6–20)
CO2: 25 mmol/L (ref 22–32)
CREATININE: 0.64 mg/dL (ref 0.44–1.00)
Calcium: 9.2 mg/dL (ref 8.9–10.3)
Chloride: 107 mmol/L (ref 101–111)
GFR calc non Af Amer: >60 mL/min (ref >60)
GLUCOSE: 117 mg/dL — AB (ref 65–99)
Potassium: 4.4 mmol/L (ref 3.5–5.1)
Sodium: 138 mmol/L (ref 135–145)

## 2015-03-09 LAB — PHOSPHORUS: Phosphorus: 2.4 mg/dL — ABNORMAL LOW (ref 2.5–4.6)

## 2015-03-09 LAB — GLUCOSE, CAPILLARY
GLUCOSE-CAPILLARY: 111 mg/dL — AB (ref 65–99)
GLUCOSE-CAPILLARY: 110 mg/dL — AB (ref 65–99)
GLUCOSE-CAPILLARY: 233 mg/dL — AB (ref 65–99)

## 2015-03-09 LAB — MAGNESIUM: Magnesium: 1.8 mg/dL (ref 1.7–2.4)

## 2015-03-09 MED ORDER — COLCHICINE 0.6 MG PO TABS
0.6000 mg | ORAL_TABLET | Freq: Every day | ORAL | Status: DC
  Administered 2015-03-09 – 2015-03-10 (×2): 0.6 mg via ORAL
  Filled 2015-03-09 (×2): qty 1

## 2015-03-09 MED ORDER — POTASSIUM & SODIUM PHOSPHATES 280-160-250 MG PO PACK
1.0000 | PACK | Freq: Three times a day (TID) | ORAL | Status: AC
  Administered 2015-03-09 (×3): 1 via ORAL
  Filled 2015-03-09 (×3): qty 1

## 2015-03-09 MED ORDER — INDOMETHACIN 25 MG PO CAPS
25.0000 mg | ORAL_CAPSULE | Freq: Three times a day (TID) | ORAL | Status: DC
  Administered 2015-03-09 – 2015-03-10 (×5): 25 mg via ORAL
  Filled 2015-03-09 (×7): qty 1

## 2015-03-09 MED ORDER — MEGESTROL ACETATE 400 MG/10ML PO SUSP
400.0000 mg | Freq: Every day | ORAL | Status: DC
  Administered 2015-03-09 – 2015-03-10 (×2): 400 mg via ORAL
  Filled 2015-03-09 (×2): qty 10

## 2015-03-09 MED ORDER — METHYLPREDNISOLONE 4 MG PO TABS
8.0000 mg | ORAL_TABLET | Freq: Every day | ORAL | Status: DC
  Administered 2015-03-09 – 2015-03-10 (×2): 8 mg via ORAL
  Filled 2015-03-09 (×2): qty 2

## 2015-03-09 NOTE — Care Management Important Message (Signed)
Important Message  Patient Details  Name: Paige Dougherty MRN: [id number] Date of Birth: 1932/11/06   Medicare Important Message Given:  Yes-third notification given    Juliann Pulse A Allmond 03/09/2015, 9:40 AM

## 2015-03-09 NOTE — Progress Notes (Signed)
PHARMACY - Electrolyte replacement supplementation   Pharmacy Consult for Electrolyte Supplementation    Allergies  Allergen Reactions  . Avapro [Irbesartan] Other (See Comments)    unknown  . Azithromycin Other (See Comments)    Extreme vaginal burning. unknown  . Clindamycin Other (See Comments)    Vaginal burning  . Duloxetine Hcl Other (See Comments)    Hyperactivity.  . Lipitor [Atorvastatin] Other (See Comments)    "muscle aches"  . Metronidazole Other (See Comments)  . Penicillins   . Prednisone Other (See Comments)    Dizziness/double vision  . Procaine Other (See Comments)    tremors  . Procaine Hcl     tremors  . Eggs Or Egg-Derived Products Other (See Comments)    Other Reaction: Not Assessed  . Other Other (See Comments) and Anxiety    Novacaine weakness  . Statins Rash    Arthralgias.  . Sulfa Antibiotics Rash    Vague history of a sulfa allergy, but does not remember the reaction.  . Tetracycline Rash    Patient Measurements: Height: 5\' 5"  (165.1 cm) Weight: 168 lb 3.2 oz (76.295 kg) IBW/kg (Calculated) : 57 Adjusted Body Weight:   Vital Signs: Temp: 97.6 F (36.4 C) (08/04 0523) Temp Source: Oral (08/04 0523) BP: 128/47 mmHg (08/04 0523) Pulse Rate: 73 (08/04 0523) Intake/Output from previous day: 08/03 0701 - 08/04 0700 In: 2163.5 [P.O.:240; I.V.:1923.5] Out: 1301 [Urine:1300; Stool:1] Intake/Output from this shift:   Vent settings for last 24 hours:    Labs:  Recent Labs  03/07/15 0651 03/08/15 0537 03/09/15 0540  WBC 9.0  --   --   HGB 13.2  --   --   HCT 39.2  --   --   PLT 269  --   --   CREATININE 0.79 0.76 0.64  MG 1.6* 1.5* 1.8  PHOS 2.8 2.4* 2.4*   Estimated Creatinine Clearance: 57.3 mL/min (by C-G formula based on Cr of 0.64).   Recent Labs  03/08/15 2214 03/09/15 0426 03/09/15 0738  GLUCAP 134* 111* 110*    Microbiology: Recent Results (from the past 720 hour(s))  MRSA PCR Screening     Status: None   Collection Time: 03/05/15  4:33 PM  Result Value Ref Range Status   MRSA by PCR NEGATIVE NEGATIVE Final    Comment:        The GeneXpert MRSA Assay (FDA approved for NASAL specimens only), is one component of a comprehensive MRSA colonization surveillance program. It is not intended to diagnose MRSA infection nor to guide or monitor treatment for MRSA infections.     Medications:  Infusions:  . 0.9 % NaCl with KCl 40 mEq / L 50 mL/hr (03/08/15 1230)    Assessment: Electrolytes within normal limits except magnesium.   Phos: 2.4   Goal of Therapy:  Electrolyte values wnl  Plan:  Will give Phos NaK 1 packet tid x 3 doses. Phosphorus check with am labs.  Pharmacy will continue to follow.   Jia Mohamed D 03/09/2015,8:05 AM

## 2015-03-09 NOTE — Progress Notes (Signed)
[place] [facility]                                                                                                                                                                                            Patient Demographics   Paige Dougherty, is a 81 y.o. female, DOB - 1933-05-21, MWN:[id number]  Admit date - 03/05/2015   Admitting Physician Lance Coon, MD  Outpatient Primary MD for the patient is BABAOFF, Caryl Bis, MD   LOS - 4  Subjective: Had more breakfast today. Complains of swelling in her fingers due to a gout flare. Physical therapy tried attempted to walk patient yesterday and was very weak.    Review of Systems:    CONSTITUTIONAL: No documented fever. Positive fatigue, positive weakness. No weight gain, no weight loss.  EYES: No blurry or double vision.  ENT: No tinnitus. No postnasal drip. No redness of the oropharynx.  RESPIRATORY: No cough, no wheeze, no hemoptysis. No dyspnea.  CARDIOVASCULAR: No chest pain. No orthopnea. No palpitations. No syncope.  GASTROINTESTINAL: No nausea, no vomiting or diarrhea. No abdominal pain. No melena or hematochezia.  GENITOURINARY:  No urgency. No frequency. No dysuria. No hematuria. No obstructive symptoms. No discharge. No pain. No significant abnormal bleeding ENDOCRINE: No polyuria or nocturia. No heat or cold intolerance.  HEMATOLOGY: No anemia. No bruising. No bleeding. No purpura. No petechiae INTEGUMENTARY: No rashes. No lesions.  MUSCULOSKELETAL: No arthritis. No swelling. No gout.  NEUROLOGIC: No numbness, tingling, or ataxia. No seizure-type activity.  PSYCHIATRIC: Depressed  Vitals:   Filed Vitals:   03/08/15 1519 03/08/15 2052 03/08/15 2102 03/09/15 0523  BP: 144/57  130/47 128/47  Pulse: 88 77 86 73  Temp: 97.5 F (36.4 C)  97.9 F (36.6 C) 97.6 F (36.4 C)  TempSrc: Oral  Oral Oral  Resp: 18  18 20   Height:      Weight:    76.295 kg (168 lb 3.2 oz)  SpO2: 96%  95% 95%     Wt Readings from Last 3 Encounters:  03/09/15 76.295 kg (168 lb 3.2 oz)  02/27/15 77.1 kg (169 lb 15.6 oz)  02/03/15 78 kg (171 lb 15.3 oz)     Intake/Output Summary (Last 24 hours) at 03/09/15 1122 Last data filed at 03/09/15 4034  Gross per 24 hour  Intake 2403.5 ml  Output   1500 ml  Net  903.5 ml    Physical Exam:   GENERAL: Appears very weak no acute distress HEAD, EYES, EARS, NOSE AND THROAT: Atraumatic, normocephalic. Extraocular muscles are intact. Pupils equal and reactive to light. Sclerae  anicteric. No conjunctival injection. No oro-pharyngeal erythema.  NECK: Supple. There is no jugular venous distention. No bruits, no lymphadenopathy, no thyromegaly.  HEART: Regular rate and rhythm,. No murmurs, no rubs, no clicks.  LUNGS: Clear to auscultation bilaterally. No rales or rhonchi. No wheezes.  ABDOMEN: Soft, flat, nontender, nondistended. Has good bowel sounds. No hepatosplenomegaly appreciated.  EXTREMITIES: No evidence of any cyanosis, clubbing, or peripheral edema.  +2 pedal and radial pulses bilaterally.  NEUROLOGIC: Able to follow commands cranial nerves II-12 grossly intact strength 5 out of 5 in all 4 extremities SKIN: Moist and warm with no rashes appreciated.  Psych: Not anxious, LN: No inguinal LN enlargement    Antibiotics   Anti-infectives    None      Medications   Scheduled Meds: . antiseptic oral rinse  7 mL Mouth Rinse QID  . calcium-vitamin D  1 tablet Oral BID  . chlorhexidine  15 mL Mouth Rinse BID  . colchicine  0.6 mg Oral Daily  . colestipol  2 g Oral BID  . heparin  5,000 Units Subcutaneous 3 times per day  . indomethacin  25 mg Oral TID  . insulin aspart  0-9 Units Subcutaneous Q6H  . loratadine  10 mg Oral Daily  . megestrol  400 mg Oral Daily  . methylPREDNISolone  8 mg Oral Daily  . mirtazapine  15 mg Oral QHS  . pantoprazole  40 mg Oral Daily  . PARoxetine  20 mg Oral Daily  . potassium & sodium phosphates  1 packet  Oral TID WC & HS  . rosuvastatin  10 mg Oral QPM  . sodium chloride  3 mL Intravenous Q12H   Continuous Infusions:   PRN Meds:.   Data Review:   Micro Results Recent Results (from the past 240 hour(s))  MRSA PCR Screening     Status: None   Collection Time: 03/05/15  4:33 PM  Result Value Ref Range Status   MRSA by PCR NEGATIVE NEGATIVE Final    Comment:        The GeneXpert MRSA Assay (FDA approved for NASAL specimens only), is one component of a comprehensive MRSA colonization surveillance program. It is not intended to diagnose MRSA infection nor to guide or monitor treatment for MRSA infections.     Radiology Reports Dg Abd 1 View  03/07/2015   CLINICAL DATA:  Distended abdomen.  EXAM: ABDOMEN - 1 VIEW  COMPARISON:  03/05/2015.  FINDINGS: NG tube in stable position. Interim resolution of gastric distention. No bowel distention. No free air. Pelvic calcifications consistent phleboliths. Lumbar scoliosis concave right diffuse degenerative change present. Tiny sclerotic density left proximal femur most likely a bone island.  IMPRESSION: 1. NG tube noted with tip in good anatomic position. 2. Interim resolution of gastric distention. No bowel distention noted.   Electronically Signed   By: Marcello Moores  Register   On: 03/07/2015 07:23   Dg Abd 1 View  03/05/2015   CLINICAL DATA:  Readjustment of NG tube  EXAM: ABDOMEN - 1 VIEW  COMPARISON:  03/05/2015  FINDINGS: Enteric tube terminates in the distal gastric body.  Mild gastric distention.  IMPRESSION: Enteric tube terminates in the distal gastric body.   Electronically Signed   By: Julian Hy M.D.   On: 03/05/2015 18:40   Dg Abd 1 View  03/05/2015   CLINICAL DATA:  Overdose on multiple meds.  NG tube placement.  EXAM: ABDOMEN - 1 VIEW  COMPARISON:  None.  FINDINGS: Nasogastric tube is  present with tip and side-port over the right upper quadrant likely over the distal stomach/ proximal duodenum.  Bowel gas pattern is  nonobstructive. There is no free peritoneal air. There are mild degenerative changes of the spine. Lung bases are within normal.  IMPRESSION: Nonobstructive bowel gas pattern.  Nasogastric tube with tip over the right upper quadrant likely over the distal stomach/ proximal duodenum.   Electronically Signed   By: Marin Olp M.D.   On: 03/05/2015 16:46   Dg Chest Port 1 View  03/05/2015   CLINICAL DATA:  Breast cancer.  Overdose.  Intubation.  EXAM: PORTABLE CHEST - 1 VIEW  COMPARISON:  None.  FINDINGS: Endotracheal tube 2 cm from carina. Normal cardiac silhouette. There is mild pulmonary edema pattern centrally. No focal infiltrate or pneumonitis identified. No pneumothorax.  IMPRESSION: 1. Endotracheal tube 2 cm from carina. 2. Mild central venous congestion / pulmonary edema.   Electronically Signed   By: Suzy Bouchard M.D.   On: 03/05/2015 13:35     CBC  Recent Labs Lab 03/05/15 1218 03/06/15 0400 03/07/15 0651  WBC 6.4 7.9 9.0  HGB 12.9 12.1 13.2  HCT 37.7 35.2 39.2  PLT 257 240 269  MCV 91.2 91.1 92.3  MCH 31.1 31.4 31.1  MCHC 34.1 34.5 33.7  RDW 14.1 14.4 14.7*    Chemistries   Recent Labs Lab 03/05/15 1218 03/06/15 0400 03/07/15 0651 03/08/15 0537 03/09/15 0540  NA 131* 132* 135 136 138  K 3.3* 3.4* 3.9 4.2 4.4  CL 95* 102 100* 103 107  CO2 27 20* 24 25 25   GLUCOSE 101* 88 133* 115* 117*  BUN 13 15 9 11 12   CREATININE 0.61 1.13* 0.79 0.76 0.64  CALCIUM 9.4 8.8* 9.4 9.4 9.2  MG  --  1.3* 1.6* 1.5* 1.8  AST 26  --   --   --   --   ALT 25  --   --   --   --   ALKPHOS 71  --   --   --   --   BILITOT 0.5  --   --   --   --    ------------------------------------------------------------------------------------------------------------------ estimated creatinine clearance is 57.3 mL/min (by C-G formula based on Cr of 0.64). ------------------------------------------------------------------------------------------------------------------ No results for input(s):  HGBA1C in the last 72 hours. ------------------------------------------------------------------------------------------------------------------ No results for input(s): CHOL, HDL, LDLCALC, TRIG, CHOLHDL, LDLDIRECT in the last 72 hours. ------------------------------------------------------------------------------------------------------------------ No results for input(s): TSH, T4TOTAL, T3FREE, THYROIDAB in the last 72 hours.  Invalid input(s): FREET3 ------------------------------------------------------------------------------------------------------------------ No results for input(s): VITAMINB12, FOLATE, FERRITIN, TIBC, IRON, RETICCTPCT in the last 72 hours.  Coagulation profile No results for input(s): INR, PROTIME in the last 168 hours.  No results for input(s): DDIMER in the last 72 hours.  Cardiac Enzymes No results for input(s): CKMB, TROPONINI, MYOGLOBIN in the last 168 hours.  Invalid input(s): CK ------------------------------------------------------------------------------------------------------------------ Invalid input(s): Pleak    1.  Acute respiratory failure with hypoxemia due to drug overdose status post extubation on room air 2. Gastric distention now resolved diet as tolerated 3. Anxiety Psychiatry evaluation done today feel that she needs inpatient psychiatric treatment 4.  DepressionPsychiatry evaluation 5.  GERD continue PPIs  6. Cancer of breast oncology consult has been placed per admitting doctor  7. Benign essential HTN When necessary hydralazine  8. Acute gout flare resume Indocin, place on Medrol  From a medical standpoint stable for discharge tomorrow for psychiatric treatment  Code Status Orders        Start     Ordered   03/05/15 1624  Full code   Continuous     03/05/15 1623           Consults pulmonary  DVT Prophylaxis heparin  Lab Results  Component Value Date   PLT 269 03/07/2015     Time  Spent  32 minutes  Dustin Flock M.D on 03/09/2015 at 11:22 AM  Between 7am to 6pm - Pager - [phone]  After 6pm go to [email or web address] - password [email or web address] [facility]   Office  [phone]

## 2015-03-09 NOTE — Progress Notes (Signed)
Speech Language Pathology Treatment: Dysphagia  Patient Details Name: Paige Dougherty MRN: [id number] DOB: 05-29-1933 Today's Date: 03/09/2015 Time: 8270-7867 SLP Time Calculation (min) (ACUTE ONLY): 32 min  Assessment / Plan / Recommendation Clinical Impression  Pt appears to be improving in her desire for oral intake and her swallowing. She tolerated trials of thin liquids, purees, and soft solids well w/ no overt s/s of aspiration noted by SLP/staff/pt. Pt fed self w/ assisance. She tolerated her meds Whole in puree w/ NSG/SLP. Rec. Diet upgrade in consistency to mech soft; aspiration precautions. ST will f/u w/ toleration of diet next 1-3 days. NSG updated.    HPI Other Pertinent Information: Pt is more alert/awake, verbally conversive w/ a stronger voice today. Pt feeding herself her lunch meal of purees and thin liquids. Pt c/o less discomfort when swallowing; appeared more alert.    Pertinent Vitals Pain Assessment: No/denies pain  SLP Plan  Continue with current plan of care    Recommendations Diet recommendations: Dysphagia 3 (mechanical soft);Thin liquid Liquids provided via: Cup;Straw Medication Administration: Whole meds with puree Supervision: Full supervision/cueing for compensatory strategies;Staff to assist with self feeding Compensations: Slow rate;Small sips/bites Postural Changes and/or Swallow Maneuvers: Seated upright 90 degrees              General recommendations: Rehab consult (TBD) Oral Care Recommendations: Oral care BID;Oral care before and after PO;Staff/trained caregiver to provide oral care Follow up Recommendations: Skilled Nursing facility Plan: Continue with current plan of care    [place], Edie, CCC-SLP  Watson,Katherine 03/09/2015, 3:46 PM

## 2015-03-09 NOTE — Progress Notes (Signed)
Physical Therapy Treatment Patient Details Name: JAISLYN BLINN MRN: [id number] DOB: 01/23/1933 Today's Date: 04/04/15    History of Present Illness      PT Comments    Treatment attempted pt being washed by CNA will reattempt  Follow Up Recommendations        Equipment Recommendations       Recommendations for Other Services       Precautions / Restrictions      Mobility  Bed Mobility                  Transfers                    Ambulation/Gait                 Stairs            Wheelchair Mobility    Modified Rankin (Stroke Patients Only)       Balance                                    Cognition                            Exercises      General Comments        Pertinent Vitals/Pain      Home Living                      Prior Function            PT Goals (current goals can now be found in the care plan section)      Frequency       PT Plan      Co-evaluation             End of Session           Time:  -     Charges:                       G Codes:      Arath Kaigler 04/04/15, 3:01 PM

## 2015-03-09 NOTE — Consult Note (Signed)
[facility] Consult   Reason for Consult:  Consult for this 81 year old woman with an intentional suicide attempt Referring Physician:  Posey Pronto Patient Identification: Paige Dougherty MRN:  [id number] Principal Diagnosis: Acute respiratory failure with hypoxemia Diagnosis:   Patient Active Problem List   Diagnosis Date Noted  . Depression, major, single episode, severe [F32.2] 03/08/2015  . Overdose of benzodiazepine [T42.4X1A] 03/05/2015  . Overdose of antidepressant [T43.201A] 03/05/2015  . Overdose of opiate or related narcotic [T40.601A] 03/05/2015  . Suicide attempt [T14.91] 03/05/2015  . Acute respiratory failure with hypoxemia [J96.01] 03/05/2015  . Cancer of breast [C50.919] 12/24/2014  . Urinary incontinence [R32]   . Fibrocystic breast disease [N60.19]   . Chronic headache [R51]   . Benign essential HTN [I10] 02/22/2014  . Anxiety [F41.9] 04/11/2009  . Depression [F32.9] 04/11/2009  . ESOPHAGEAL STRICTURE [K22.2] 04/11/2009  . GERD [K21.9] 04/11/2009  . DIVERTICULOSIS, COLON [K57.30] 04/11/2009  . IRRITABLE BOWEL SYNDROME [K58.9] 04/11/2009  . ABDOMINAL PAIN, UNSPECIFIED SITE [R10.9] 04/11/2009    Total Time spent with patient: 1 hour  Subjective:   Paige Dougherty is a 81 y.o. female patient admitted with "I'm embarrassed about what I did".  HPI:  Information from the chart from the patient and from the patient's family including the daughter and husband both of whom are present. I was finally able to do a fairly complete assessment on this patient today. Patient states that she had been feeling depressed probably for several weeks prior to admission. She describes anhedonia with a lack of any interest or in her usual activities and the lack of any motivation. She was feeling hopeless. She was feeling tired had poor appetite poor sleep. Patient admits that the doctors had told her that she was currently cancer free and yet she appeared to be hopeless about her  cancer and to be justifying to herself or suicide attempt. She admits that she took a large quantity of all of her medication in an attempt to kill her self. She is not reporting definite hallucinations. Husband and daughter support the history stating that she had been clearly depressed for several weeks. Some other information that might bear on the situation is that after having surgery and radiation her oncologist had wanted her to start long-term suppressing medication, probably an estrogen blocker like tamoxifen. Evidently the patient had some GI side effects and couldn't tolerate it. She interpreted this as being a hopeless situation. The daughter and husband also give an interesting extra piece of history. Beginning last October or November, several months before her cancer diagnosis, the patient was abruptly taken off of hormone replacement therapy that she had been on for years. Sometime shortly after that she developed behaviors that were very atypical for her. She apparently was stating she was going to divorce her husband and move out of the house and seems to of been agitated and possibly manic from their description. It sounds like it calm down a little bit towards the end of the year and into January and then she got the cancer diagnosis. Husband reports other details that supports the seriousness of her suicide attempt. He had reported that she made comments to him about wishing that she could die like their neighbor who died in her sleep and about how she very pointedly said goodbye to him after taking the overdose.  Past psychiatric history: Patient states that when she was an adolescent she had an episode of extreme sadness and depression when her family  very briefly moved to [place]. At that time she contemplated suicide but said she did not attempt it. She describes herself as having been a "nervous" person all of her life. She had been treated with paroxetine for years so far back that the  original syndrome is not entirely clear. No other known psychiatric medicine. No known prior suicide attempts and no known psychiatric hospitalization.  Medical history: Patient was diagnosed with breast cancer early in this year. She had surgery and radiation. Otherwise she seems to be in pretty good health. History of esophageal stricture and some irritable bowel syndrome.  Social history: Patient lives with her husband. The 2 of them of been married for almost 60 years. She has adult children and appears to have a good relationship with him. Both the husband and the daughter report that both the manic like behavior and the depression were out of character for her and that she normally seemed to be a happy and socially involved person.  Family history: The daughter reports that she has panic attacks. It's also reported there may be other anxiety in the family.  Substance abuse history: No known history of alcohol or drug abuse.  Current situation: Patient was in the critical care unit for several days after her overdose. She has now been extubated and is recovering any regular medical bed. She continues to report being so weak that she cannot stand up and to be eating very little.  Update as of Thursday the fourth. Patient continues to be very withdrawn. Not getting up out of bed. Not eating very much. Affect withdrawn and flat. Mood depressed. Patient continues to be extremely depressed and at high risk of repeated suicidal behavior. Plan today is that I have initiated involuntary commitment papers to allow for transfer to geriatric psychiatry unit. Continue current medicine and monitoring. Supportive therapy completed HPI Elements:   Quality:  Severe depression with a serious suicide attempt. Severity:  Extremely severe almost managed to kill her self. Ongoing depression. Timing:  Seems to of been up and down over the last several months with at least 8 weeks or so of depression culminating in  this suicide attempt. Duration:  As noted above it is also still ongoing. Context:  Recent diagnosis and treatment for cancer although from everything I can tell the treatment was successful and she was not in a hopeless or chronic pain situation.  Past Medical History:  Past Medical History  Diagnosis Date  . Diverticulosis   . GERD (gastroesophageal reflux disease)   . Esophageal stricture   . Depression   . Anxiety   . Barrett's esophagus   . IBS (irritable bowel syndrome)   . Adenomatous colon polyp   . Diabetes mellitus   . Hypertension   . Hyperlipemia   . Allergy   . Arthritis     HANDS/FEET  . Urinary incontinence   . Fibrocystic breast disease   . Chronic headache   . Cancer of breast 12/24/2014    Past Surgical History  Procedure Laterality Date  . Dilation and curettage of uterus    . Abdominal hysterectomy    . Cataract extraction      bilateral   . Carpal tunnel release      bilateral  . Finger cyst removal    . Neck cyst removal    . Upper gastrointestinal endoscopy    . Colonoscopy     Family History:  Family History  Problem Relation Age of Onset  . Colon  cancer Father   . Thyroid disease Father   . Thyroid disease Mother   . Hypertension Mother   . Breast cancer Maternal Grandmother    Social History:  History  Alcohol Use  . Yes    Comment: occasional     History  Drug Use No    History   Social History  . Marital Status: Married    Spouse Name: N/A  . Number of Children: N/A  . Years of Education: N/A   Social History Main Topics  . Smoking status: Never Smoker   . Smokeless tobacco: Never Used  . Alcohol Use: Yes     Comment: occasional  . Drug Use: No  . Sexual Activity: Not on file   Other Topics Concern  . None   Social History Narrative   Additional Social History:                          Allergies:   Allergies  Allergen Reactions  . Avapro [Irbesartan] Other (See Comments)    unknown  .  Azithromycin Other (See Comments)    Extreme vaginal burning. unknown  . Clindamycin Other (See Comments)    Vaginal burning  . Duloxetine Hcl Other (See Comments)    Hyperactivity.  . Lipitor [Atorvastatin] Other (See Comments)    "muscle aches"  . Metronidazole Other (See Comments)  . Penicillins   . Prednisone Other (See Comments)    Dizziness/double vision  . Procaine Other (See Comments)    tremors  . Procaine Hcl     tremors  . Eggs Or Egg-Derived Products Other (See Comments)    Other Reaction: Not Assessed  . Other Other (See Comments) and Anxiety    Novacaine weakness  . Statins Rash    Arthralgias.  . Sulfa Antibiotics Rash    Vague history of a sulfa allergy, but does not remember the reaction.  . Tetracycline Rash    Labs:  Results for orders placed or performed during the hospital encounter of 03/05/15 (from the past 48 hour(s))  Glucose, capillary     Status: Abnormal   Collection Time: 03/07/15  9:49 PM  Result Value Ref Range   Glucose-Capillary 111 (H) 65 - 99 mg/dL  Glucose, capillary     Status: Abnormal   Collection Time: 03/08/15  4:47 AM  Result Value Ref Range   Glucose-Capillary 115 (H) 65 - 99 mg/dL  Basic metabolic panel     Status: Abnormal   Collection Time: 03/08/15  5:37 AM  Result Value Ref Range   Sodium 136 135 - 145 mmol/L   Potassium 4.2 3.5 - 5.1 mmol/L   Chloride 103 101 - 111 mmol/L   CO2 25 22 - 32 mmol/L   Glucose, Bld 115 (H) 65 - 99 mg/dL   BUN 11 6 - 20 mg/dL   Creatinine, Ser 0.76 0.44 - 1.00 mg/dL   Calcium 9.4 8.9 - 10.3 mg/dL   GFR calc non Af Amer >60 >60 mL/min   GFR calc Af Amer >60 >60 mL/min    Comment: (NOTE) The eGFR has been calculated using the CKD EPI equation. This calculation has not been validated in all clinical situations. eGFR's persistently <60 mL/min signify possible Chronic Kidney Disease.    Anion gap 8 5 - 15  Phosphorus     Status: Abnormal   Collection Time: 03/08/15  5:37 AM  Result  Value Ref Range   Phosphorus 2.4 (L) 2.5 -  4.6 mg/dL  Magnesium     Status: Abnormal   Collection Time: 03/08/15  5:37 AM  Result Value Ref Range   Magnesium 1.5 (L) 1.7 - 2.4 mg/dL  Glucose, capillary     Status: None   Collection Time: 03/08/15  9:58 AM  Result Value Ref Range   Glucose-Capillary 98 65 - 99 mg/dL   Comment 1 Notify RN   Glucose, capillary     Status: Abnormal   Collection Time: 03/08/15  4:57 PM  Result Value Ref Range   Glucose-Capillary 135 (H) 65 - 99 mg/dL   Comment 1 Notify RN   Glucose, capillary     Status: Abnormal   Collection Time: 03/08/15 10:14 PM  Result Value Ref Range   Glucose-Capillary 134 (H) 65 - 99 mg/dL  Glucose, capillary     Status: Abnormal   Collection Time: 03/09/15  4:26 AM  Result Value Ref Range   Glucose-Capillary 111 (H) 65 - 99 mg/dL  Basic metabolic panel     Status: Abnormal   Collection Time: 03/09/15  5:40 AM  Result Value Ref Range   Sodium 138 135 - 145 mmol/L   Potassium 4.4 3.5 - 5.1 mmol/L   Chloride 107 101 - 111 mmol/L   CO2 25 22 - 32 mmol/L   Glucose, Bld 117 (H) 65 - 99 mg/dL   BUN 12 6 - 20 mg/dL   Creatinine, Ser 0.64 0.44 - 1.00 mg/dL   Calcium 9.2 8.9 - 10.3 mg/dL   GFR calc non Af Amer >60 >60 mL/min   GFR calc Af Amer >60 >60 mL/min    Comment: (NOTE) The eGFR has been calculated using the CKD EPI equation. This calculation has not been validated in all clinical situations. eGFR's persistently <60 mL/min signify possible Chronic Kidney Disease.    Anion gap 6 5 - 15  Phosphorus     Status: Abnormal   Collection Time: 03/09/15  5:40 AM  Result Value Ref Range   Phosphorus 2.4 (L) 2.5 - 4.6 mg/dL  Magnesium     Status: None   Collection Time: 03/09/15  5:40 AM  Result Value Ref Range   Magnesium 1.8 1.7 - 2.4 mg/dL  Glucose, capillary     Status: Abnormal   Collection Time: 03/09/15  7:38 AM  Result Value Ref Range   Glucose-Capillary 110 (H) 65 - 99 mg/dL    Vitals: Blood pressure 128/47,  pulse 73, temperature 97.6 F (36.4 C), temperature source Oral, resp. rate 20, height $RemoveBe'5\' 5"'RiInduIlE$  (1.651 m), weight 76.295 kg (168 lb 3.2 oz), SpO2 95 %.  Risk to Self: Is patient at risk for suicide?: Yes Risk to Others:   Prior Inpatient Therapy:   Prior Outpatient Therapy:    Current Facility-Administered Medications  Medication Dose Route Frequency Provider Last Rate Last Dose  . antiseptic oral rinse (CPC / CETYLPYRIDINIUM CHLORIDE 0.05%) solution 7 mL  7 mL Mouth Rinse QID Lance Coon, MD   7 mL at 03/09/15 1837  . calcium-vitamin D (OSCAL WITH D) 500-200 MG-UNIT per tablet 1 tablet  1 tablet Oral BID Dustin Flock, MD   1 tablet at 03/09/15 1031  . chlorhexidine (PERIDEX) 0.12 % solution 15 mL  15 mL Mouth Rinse BID Lance Coon, MD   15 mL at 03/09/15 0748  . colchicine tablet 0.6 mg  0.6 mg Oral Daily Dustin Flock, MD   0.6 mg at 03/09/15 1256  . colestipol (COLESTID) tablet 2 g  2 g Oral  BID Dustin Flock, MD   2 g at 03/09/15 1030  . heparin injection 5,000 Units  5,000 Units Subcutaneous 3 times per day Lance Coon, MD   5,000 Units at 03/09/15 1441  . hydrALAZINE (APRESOLINE) injection 10 mg  10 mg Intravenous Q6H PRN Dustin Flock, MD      . ibuprofen (ADVIL,MOTRIN) tablet 200 mg  200 mg Oral Q6H PRN Dustin Flock, MD      . indomethacin (INDOCIN) capsule 25 mg  25 mg Oral TID Dustin Flock, MD   25 mg at 03/09/15 1837  . insulin aspart (novoLOG) injection 0-9 Units  0-9 Units Subcutaneous Q6H Lance Coon, MD   1 Units at 03/08/15 1824  . loratadine (CLARITIN) tablet 10 mg  10 mg Oral Daily Dustin Flock, MD   10 mg at 03/09/15 1030  . megestrol (MEGACE) 400 MG/10ML suspension 400 mg  400 mg Oral Daily Dustin Flock, MD   400 mg at 03/09/15 1032  . methylPREDNISolone (MEDROL) tablet 8 mg  8 mg Oral Daily Dustin Flock, MD   8 mg at 03/09/15 1257  . mirtazapine (REMERON SOL-TAB) disintegrating tablet 15 mg  15 mg Oral QHS Gonzella Lex, MD   15 mg at 03/08/15 2211  .  pantoprazole (PROTONIX) EC tablet 40 mg  40 mg Oral Daily Dustin Flock, MD   40 mg at 03/09/15 1030  . PARoxetine (PAXIL) tablet 20 mg  20 mg Oral Daily Dustin Flock, MD   20 mg at 03/09/15 1030  . phenol (CHLORASEPTIC) mouth spray 2 spray  2 spray Mouth/Throat Q4H PRN Flora Lipps, MD   2 spray at 03/07/15 1307  . potassium & sodium phosphates (PHOS-NAK) 280-160-250 MG packet 1 packet  1 packet Oral TID WC & HS Dustin Flock, MD   1 packet at 03/09/15 1837  . rosuvastatin (CRESTOR) tablet 10 mg  10 mg Oral QPM Dustin Flock, MD   10 mg at 03/08/15 1824  . sodium chloride 0.9 % injection 3 mL  3 mL Intravenous Q12H Lance Coon, MD   3 mL at 03/08/15 7867    Musculoskeletal: Strength & Muscle Tone: decreased Gait & Station: unable to stand Patient leans: N/A  Psychiatric Specialty Exam: Physical Exam  Constitutional: Vital signs are normal. She appears lethargic.  Neurological: She appears lethargic.  Gen. subjective weakness  Psychiatric: Her speech is delayed. She is slowed. Cognition and memory are impaired. She expresses impulsivity. She exhibits a depressed mood. She expresses suicidal ideation. She exhibits abnormal recent memory.  Patient presents as very withdrawn and subdued. Makes minimal eye contact. Keeps her eyes closed for the most part. Speech is very quiet. Affect flat. Reported his being embarrassed. Still appears to be very depressed    Review of Systems  HENT: Negative.   Eyes: Negative.   Respiratory: Negative.   Cardiovascular: Negative.   Gastrointestinal: Negative.   Musculoskeletal: Negative.   Skin: Negative.   Neurological: Positive for focal weakness and weakness.  Psychiatric/Behavioral: Positive for depression and suicidal ideas. Negative for hallucinations and substance abuse. The patient is nervous/anxious and has insomnia.     Blood pressure 128/47, pulse 73, temperature 97.6 F (36.4 C), temperature source Oral, resp. rate 20, height $RemoveBe'5\' 5"'mupuKCkSC$  (1.651  m), weight 76.295 kg (168 lb 3.2 oz), SpO2 95 %.Body mass index is 27.99 kg/(m^2).  General Appearance: Guarded  Eye Contact::  Minimal  Speech:  Slow  Volume:  Patient essentially whispers for the whole interview.  Mood:  Depressed  Affect:  Depressed  Thought Process:  Linear  Orientation:  Full (Time, Place, and Person)  Thought Content:  Negative  Suicidal Thoughts:  Yes.  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 My interaction with her was still slightly limited in that it is hard to tell if she might actually still be having some degree of psychotic negativity. The history is quite interesting in that she did not have a past history of major depression but had an episode of something that sounds possibly manic like late last year before this depression and that the family sees there being some connection to her having her gastric and discontinued. In any case right now she is severely depressed and still dangerous to herself. I think I will probably be filing involuntary  commitment papers on her. I'm fairly certain that we will need to have her go to an inpatient psychiatric unit. I will ask social work to go ahead and start looking towards that. I'm not sure when medicine will feel like she is ready for discharge. Patient's weakness and lack of appetite are probably mainly manifestations of her depression still. Encouraged patient to please eat and drink well and try and work on her strength. Discuss the situation with the family particularly the husband. I'm going to start her on 15 mg of mirtazapine tonight for depression. Continue the Paxil. I will follow up regularly.   See note above. Tolerated initial mirtazapine. Still extremely depressed. Needs hospitalization. Continue with referral to geriatric psychiatry unit. Commitment papers filed.  Plan:  Recommend psychiatric Inpatient admission when medically cleared. Supportive therapy provided about ongoing stressors. Disposition: Continue treatment for now. Anticipate probable referral to geriatric unit.  John Clapacs 03/09/2015 7:04 PM

## 2015-03-09 NOTE — Plan of Care (Signed)
Problem: Discharge Progression Outcomes Goal: Discharge plan in place and appropriate Individualization:  Outcome: Progressing Pt prefers to be called Paige Dougherty who lives at home with her husband.   Hx DM, Anxiety, Depression, HTN, recent diagnosis breast ca (in February). High fall risk. Safety sitter at bedside. Bed alarm on.   Goal: Other Discharge Outcomes/Goals Outcome: Progressing No c/o pain. Sitter at bedside. Denies suicidal ideation. VSS. Up to [facility] with assist to void. More awake and alert. Consumed more of her meals than previously.

## 2015-03-09 NOTE — Plan of Care (Signed)
Problem: Discharge Progression Outcomes Goal: Activity appropriate for discharge plan Outcome: Progressing  Progression to disharge: Pt worked with pt, tolerated well but unsteady gait at this time. 1-2 assist to bedside commode. Possibly medically discharge tomorrow, but pt will need geriatric phychiatric rehab per dr.s order. Appetite improving, pt is a feeder, slow to swallow and follow commands Husband at bedside and aware of plan of care 1:1 sitter for suicide, frequent rounding noted.

## 2015-03-09 NOTE — Clinical Social Work Note (Signed)
Clinical Social Work Assessment  Patient Details  Name: Paige Dougherty MRN: [id number] Date of Birth: January 13, 1933  Date of referral:  03/08/15               Reason for consult:  Discharge Planning, Suicide Risk/Attempt, Other (Comment Required) (Geriatric Psychiatric Inpatient placement)                Permission sought to share information with:  Family Supports Permission granted to share information::  Yes, Verbal Permission Granted  Name::     Paige Dougherty  Agency::     Relationship::  husband  Contact Information:  [phone]  Housing/Transportation Living arrangements for the past 2 months:  [place] of Information:  Patient, Spouse Patient Interpreter Needed:  None Criminal Activity/Legal Involvement Pertinent to Current Situation/Hospitalization:  No - Comment as needed Significant Relationships:  Adult Children, Spouse Lives with:  Self, Spouse Do you feel safe going back to the place where you live?  Yes Need for family participation in patient care:  Yes (Comment)  Care giving concerns:  Pt depressed and admitted following a suicide attempt.   Social Worker assessment / plan:  CSW met with pt and husband to address consult.  CSW explained role of CSW.  Pt and husband have been married for 25 years and have 1 daughter.  Pt recently completed treatment for breast cancer and overdosed on medication with led to current admission.  CSW explained that PT has recommended STR and psychiatrist has recommended inpatient psychiatric admission.  Pt and husband are in agreement with referral to inpatient psychiatric facility and prefer placement here at [facility].  CSW explained that a bed search would be initiated and placement is contingent upon bed availability and facilities ability to accommodate pt's needs.  CSW will initiate referral, continue to follow pt and assist with d/c planning needs.  Employment status:  Retired Nurse, adult PT  Recommendations:  [place] / Referral to community resources:  [place] (Comment Required)  Patient/Family's Response to care:  Pt was quiet but pleasant and her husband did most of the talking.    Patient/Family's Understanding of and Emotional Response to Diagnosis, Current Treatment, and Prognosis:  Pt and husband agreed that they understand the need for an inpatient psychiatric admission.    Emotional Assessment Appearance:  Appears younger than stated age Attitude/Demeanor/Rapport:  Lethargic Affect (typically observed):  Accepting, Flat, Depressed Orientation:  Oriented to Self, Oriented to Place, Oriented to  Time, Oriented to Situation Alcohol / Substance use:  Not Applicable Psych involvement (Current and /or in the community):  Yes (Comment)  Discharge Needs  Concerns to be addressed:  Discharge Planning Concerns Readmission within the last 30 days:  No Current discharge risk:  Psychiatric Illness Barriers to Discharge:  Psych Bed not available   [place], LCSW 03/09/2015, 1:56 PM

## 2015-03-10 LAB — GLUCOSE, CAPILLARY
GLUCOSE-CAPILLARY: 152 mg/dL — AB (ref 65–99)
Glucose-Capillary: 181 mg/dL — ABNORMAL HIGH (ref 65–99)
Glucose-Capillary: 202 mg/dL — ABNORMAL HIGH (ref 65–99)

## 2015-03-10 LAB — BASIC METABOLIC PANEL
ANION GAP: 8 (ref 5–15)
BUN: 15 mg/dL (ref 6–20)
CALCIUM: 9.5 mg/dL (ref 8.9–10.3)
CO2: 24 mmol/L (ref 22–32)
CREATININE: 0.78 mg/dL (ref 0.44–1.00)
Chloride: 103 mmol/L (ref 101–111)
GFR calc Af Amer: >60 mL/min (ref >60)
GFR calc non Af Amer: >60 mL/min (ref >60)
Glucose, Bld: 152 mg/dL — ABNORMAL HIGH (ref 65–99)
Potassium: 5.2 mmol/L — ABNORMAL HIGH (ref 3.5–5.1)
Sodium: 135 mmol/L (ref 135–145)

## 2015-03-10 LAB — MAGNESIUM: Magnesium: 1.7 mg/dL (ref 1.7–2.4)

## 2015-03-10 LAB — PHOSPHORUS: Phosphorus: 2.9 mg/dL (ref 2.5–4.6)

## 2015-03-10 MED ORDER — POLYETHYLENE GLYCOL 3350 17 G PO PACK
17.0000 g | PACK | Freq: Every day | ORAL | Status: DC
  Administered 2015-03-10: 10:00:00 17 g via ORAL
  Filled 2015-03-10: qty 1

## 2015-03-10 MED ORDER — SENNA 8.6 MG PO TABS
1.0000 | ORAL_TABLET | Freq: Every day | ORAL | Status: DC
  Administered 2015-03-10: 8.6 mg via ORAL
  Filled 2015-03-10: qty 1

## 2015-03-10 MED ORDER — METHYLPREDNISOLONE 2 MG PO TABS: 2.0000 mg | ORAL_TABLET | Freq: Every day | ORAL | Status: DC

## 2015-03-10 MED ORDER — COLCHICINE 0.6 MG PO TABS: 0.6000 mg | ORAL_TABLET | Freq: Every day | ORAL | Status: DC

## 2015-03-10 MED ORDER — INSULIN ASPART 100 UNIT/ML ~~LOC~~ SOLN
0.0000 [IU] | Freq: Three times a day (TID) | SUBCUTANEOUS | Status: DC
  Administered 2015-03-10: 2 [IU] via SUBCUTANEOUS
  Filled 2015-03-10: qty 20
  Filled 2015-03-10: qty 3
  Filled 2015-03-10: qty 2

## 2015-03-10 MED ORDER — DOCUSATE SODIUM 100 MG PO CAPS
200.0000 mg | ORAL_CAPSULE | Freq: Two times a day (BID) | ORAL | Status: DC
  Administered 2015-03-10: 200 mg via ORAL
  Filled 2015-03-10: qty 2

## 2015-03-10 MED ORDER — MIRTAZAPINE 15 MG PO TBDP: 15.0000 mg | ORAL_TABLET | Freq: Every day | ORAL | Status: DC

## 2015-03-10 NOTE — Progress Notes (Signed)
Call from Katherine at [facility], willing to take patient, requested EKG, face sheet and current note from Psych MD.  Psych MD has met with patient and informed her of disposition to [facility].  CSW will fax requested information.    . LCSWA, MSW Clinical Social Work Department Emergency Room [phone] 2:04 PM 

## 2015-03-10 NOTE — Progress Notes (Signed)
Dr. Arline Asp meeting with family.

## 2015-03-10 NOTE — Progress Notes (Signed)
PHARMACY - Electrolyte replacement supplementation   Pharmacy Consult for Electrolyte Supplementation    Allergies  Allergen Reactions  . Avapro [Irbesartan] Other (See Comments)    unknown  . Azithromycin Other (See Comments)    Extreme vaginal burning. unknown  . Clindamycin Other (See Comments)    Vaginal burning  . Duloxetine Hcl Other (See Comments)    Hyperactivity.  . Lipitor [Atorvastatin] Other (See Comments)    "muscle aches"  . Metronidazole Other (See Comments)  . Penicillins   . Prednisone Other (See Comments)    Dizziness/double vision  . Procaine Other (See Comments)    tremors  . Procaine Hcl     tremors  . Eggs Or Egg-Derived Products Other (See Comments)    Other Reaction: Not Assessed  . Other Other (See Comments) and Anxiety    Novacaine weakness  . Statins Rash    Arthralgias.  . Sulfa Antibiotics Rash    Vague history of a sulfa allergy, but does not remember the reaction.  . Tetracycline Rash    Patient Measurements: Height: 5\' 5"  (165.1 cm) Weight: 168 lb 3.2 oz (76.295 kg) IBW/kg (Calculated) : 57 Adjusted Body Weight:   Vital Signs: Temp: 97.9 F (36.6 C) (08/04 2132) Temp Source: Oral (08/04 2132) BP: 185/68 mmHg (08/04 2132) Pulse Rate: 90 (08/04 2132) Intake/Output from previous day: 08/04 0701 - 08/05 0700 In: 960 [P.O.:960] Out: 1550 [Urine:1550] Intake/Output from this shift: Total I/O In: -  Out: 700 [Urine:700] Vent settings for last 24 hours:    Labs:  Recent Labs  03/07/15 0651 03/08/15 0537 03/09/15 0540 03/10/15 0333  WBC 9.0  --   --   --   HGB 13.2  --   --   --   HCT 39.2  --   --   --   PLT 269  --   --   --   CREATININE 0.79 0.76 0.64  --   MG 1.6* 1.5* 1.8  --   PHOS 2.8 2.4* 2.4* 2.9   Estimated Creatinine Clearance: 57.3 mL/min (by C-G formula based on Cr of 0.64).   Recent Labs  03/09/15 0738 03/09/15 2208 03/10/15 0411  GLUCAP 110* 233* 152*    Microbiology: Recent Results (from  the past 720 hour(s))  MRSA PCR Screening     Status: None   Collection Time: 03/05/15  4:33 PM  Result Value Ref Range Status   MRSA by PCR NEGATIVE NEGATIVE Final    Comment:        The GeneXpert MRSA Assay (FDA approved for NASAL specimens only), is one component of a comprehensive MRSA colonization surveillance program. It is not intended to diagnose MRSA infection nor to guide or monitor treatment for MRSA infections.     Medications:  Infusions:     Assessment: Phos: 2.9. Pt has diet ordered and is able to take in food at this point.   Goal of Therapy:  Electrolyte values wnl  Plan:  Will order labs for tomorrow morning.    Laural Benes 03/10/2015,4:46 AM

## 2015-03-10 NOTE — Discharge Summary (Signed)
Paige Dougherty, 81 y.o., DOB 21-May-1933, MRN [id number]. Admission date: 03/05/2015 Discharge Date 03/10/2015 Primary MD BABAOFF, Caryl Bis, MD Admitting Physician Lance Coon, MD  Admission Diagnosis  Overdose of benzodiazepine, intentional self-harm, initial encounter [T42.4X2A] Overdose of opiate or related narcotic, intentional self-harm, initial encounter [T40.602A] Overdose of antidepressant, intentional self-harm, initial encounter [T43.202A]  Discharge Diagnosis   Principal Problem:   Acute respiratory failure with hypoxemia  Suicide attempt  severe depression   Anxiety   GERD   Cancer of breast   Benign essential HTN   Overdose of benzodiazepine   Overdose of antidepressant   Overdose of opiate or related narcotic   Suicide attempt   Depression, major, single episode, severe   Major depressive disorder, single episode, severe without psychotic features         Hospital Course Paige Dougherty is a 81 y.o. female who presents with acute respiratory failure due to overdose with multiple medications.  Husband stated that the patient has a relatively recent diagnosis of breast cancer, made in spring of this year. She received lumpectomy with lymph node dissection and subsequent radiation therapy which she has completed. She did not receive any chemotherapy. Her husband states that she was put on hormonal therapy based on her cancer typing, and was unable to tolerate this medication. Per the husband's report as well as chart review by her primary oncologist, she was taken off of this therapy due to her intolerance of it. Her husband states that she began with signs and symptoms of depression sometime after being diagnosed with breast cancer, but that it got acutely worse when she was unable to tolerate the hormone therapy. Patient apparently experienced significant quality-of-life alteration with daily nausea vomiting and malaise, even after the therapy was stopped. On day of admission she  was with her husband and stated that she felt like she needed to go to bed, he helped her to bed at which point the patient elected him and said "goodbye". He was at that point that he noticed all of her medication bottles were empty at her bedside. It is unclear exactly how much medication she took, however the medications included Xanax, Paxil, hydrocodone, hydrochlorothiazide, and a PPI. Patient was intubated in route to the ED. She was mantained on ventalator then subsequently extubated when she was able to breath spontanously. Pt after waking up continue display severe depression and was seen by Dr. Weber Cooks of psychiatry. He felt she needs in patient psychiatry treatment for her depression . He arranged with case manger for her discharge.  Patient was involunteerly commited by him. From medical standpoint she is stable for discharge.              Consults  Pulmonary, psychiatry Significant Tests:  See full reports for all details      Dg Abd 1 View  03/07/2015   CLINICAL DATA:  Distended abdomen.  EXAM: ABDOMEN - 1 VIEW  COMPARISON:  03/05/2015.  FINDINGS: NG tube in stable position. Interim resolution of gastric distention. No bowel distention. No free air. Pelvic calcifications consistent phleboliths. Lumbar scoliosis concave right diffuse degenerative change present. Tiny sclerotic density left proximal femur most likely a bone island.  IMPRESSION: 1. NG tube noted with tip in good anatomic position. 2. Interim resolution of gastric distention. No bowel distention noted.   Electronically Signed   By: Marcello Moores  Register   On: 03/07/2015 07:23   Dg Abd 1 View  03/05/2015   CLINICAL DATA:  Readjustment of NG  tube  EXAM: ABDOMEN - 1 VIEW  COMPARISON:  03/05/2015  FINDINGS: Enteric tube terminates in the distal gastric body.  Mild gastric distention.  IMPRESSION: Enteric tube terminates in the distal gastric body.   Electronically Signed   By: Julian Hy M.D.   On: 03/05/2015 18:40    Dg Abd 1 View  03/05/2015   CLINICAL DATA:  Overdose on multiple meds.  NG tube placement.  EXAM: ABDOMEN - 1 VIEW  COMPARISON:  None.  FINDINGS: Nasogastric tube is present with tip and side-port over the right upper quadrant likely over the distal stomach/ proximal duodenum.  Bowel gas pattern is nonobstructive. There is no free peritoneal air. There are mild degenerative changes of the spine. Lung bases are within normal.  IMPRESSION: Nonobstructive bowel gas pattern.  Nasogastric tube with tip over the right upper quadrant likely over the distal stomach/ proximal duodenum.   Electronically Signed   By: Marin Olp M.D.   On: 03/05/2015 16:46   Dg Chest Port 1 View  03/05/2015   CLINICAL DATA:  Breast cancer.  Overdose.  Intubation.  EXAM: PORTABLE CHEST - 1 VIEW  COMPARISON:  None.  FINDINGS: Endotracheal tube 2 cm from carina. Normal cardiac silhouette. There is mild pulmonary edema pattern centrally. No focal infiltrate or pneumonitis identified. No pneumothorax.  IMPRESSION: 1. Endotracheal tube 2 cm from carina. 2. Mild central venous congestion / pulmonary edema.   Electronically Signed   By: Suzy Bouchard M.D.   On: 03/05/2015 13:35       Today   Subjective:   Paige Dougherty   Nurse was able to get patient up to chair, gout in hands better  Objective:   Blood pressure 158/65, pulse 85, temperature 97.7 F (36.5 C), temperature source Oral, resp. rate 16, height 5\' 5"  (1.651 m), weight 76.295 kg (168 lb 3.2 oz), SpO2 97 %.  .  Intake/Output Summary (Last 24 hours) at 03/10/15 1456 Last data filed at 03/10/15 1300  Gross per 24 hour  Intake   1980 ml  Output   1600 ml  Net    380 ml    Exam VITAL SIGNS: Blood pressure 158/65, pulse 85, temperature 97.7 F (36.5 C), temperature source Oral, resp. rate 16, height 5\' 5"  (1.651 m), weight 76.295 kg (168 lb 3.2 oz), SpO2 97 %.  GENERAL:  81 y.o.-year-old patient lying in the bed with no acute distress.  EYES: Pupils equal,  round, reactive to light and accommodation. No scleral icterus. Extraocular muscles intact.  HEENT: Head atraumatic, normocephalic. Oropharynx and nasopharynx clear.  NECK:  Supple, no jugular venous distention. No thyroid enlargement, no tenderness.  LUNGS: Normal breath sounds bilaterally, no wheezing, rales,rhonchi or crepitation. No use of accessory muscles of respiration.  CARDIOVASCULAR: S1, S2 normal. No murmurs, rubs, or gallops.  ABDOMEN: Soft, nontender, nondistended. Bowel sounds present. No organomegaly or mass.  EXTREMITIES: No pedal edema, cyanosis, or clubbing.  NEUROLOGIC: Cranial nerves II through XII are intact. Muscle strength 5/5 in all extremities. Sensation intact. Gait not checked.  PSYCHIATRIC: The patient is alert and oriented x 3. depressed SKIN: No obvious rash, lesion, or ulcer.   Data Review     CBC w Diff: Lab Results  Component Value Date   WBC 9.0 03/07/2015   WBC 5.3 12/02/2014   HGB 13.2 03/07/2015   HGB 13.3 12/02/2014   HCT 39.2 03/07/2015   HCT 41.3 12/02/2014   PLT 269 03/07/2015   PLT 220 12/02/2014   LYMPHOPCT  13 01/26/2015   LYMPHOPCT 15.6 12/02/2014   MONOPCT 10 01/26/2015   MONOPCT 7.6 12/02/2014   EOSPCT 1 01/26/2015   EOSPCT 1.5 12/02/2014   BASOPCT 1 01/26/2015   BASOPCT 0.5 12/02/2014   CMP: Lab Results  Component Value Date   NA 135 03/10/2015   K 5.2* 03/10/2015   CL 103 03/10/2015   CO2 24 03/10/2015   BUN 15 03/10/2015   CREATININE 0.78 03/10/2015   PROT 6.1* 03/05/2015   ALBUMIN 3.4* 03/05/2015   BILITOT 0.5 03/05/2015   ALKPHOS 71 03/05/2015   AST 26 03/05/2015   ALT 25 03/05/2015  .  Micro Results Recent Results (from the past 240 hour(s))  MRSA PCR Screening     Status: None   Collection Time: 03/05/15  4:33 PM  Result Value Ref Range Status   MRSA by PCR NEGATIVE NEGATIVE Final    Comment:        The GeneXpert MRSA Assay (FDA approved for NASAL specimens only), is one component of a comprehensive MRSA  colonization surveillance program. It is not intended to diagnose MRSA infection nor to guide or monitor treatment for MRSA infections.         Code Status Orders        Start     Ordered   03/05/15 1624  Full code   Continuous     03/05/15 1623          Follow-up Information    Follow up with BABAOFF, Caryl Bis, MD.   Specialty:  Family Medicine   Contact information:   [address] [address] [phone]       Discharge Medications     Medication List    STOP taking these medications        ALPRAZolam 0.5 MG tablet  Commonly known as:  XANAX     clidinium-chlordiazePOXIDE 5-2.5 MG per capsule  Commonly known as:  LIBRAX     L-METHYLFOLATE-ALGAE PO     omeprazole 20 MG capsule  Commonly known as:  PRILOSEC     OVER THE COUNTER MEDICATION     PARoxetine 20 MG tablet  Commonly known as:  PAXIL     Red Yeast Rice 600 MG Caps      TAKE these medications        acetaminophen 500 MG tablet  Commonly known as:  TYLENOL  Take by mouth.     ANTI-DIARRHEAL PO  Take by mouth as needed.     calcium-vitamin D 500-200 MG-UNIT per tablet  Commonly known as:  OSCAL WITH D  Take 1 tablet by mouth 2 (two) times daily.     CENTRUM SILVER PO  Take 1 tablet by mouth daily.     cetirizine 10 MG tablet  Commonly known as:  ZYRTEC  Take 10 mg by mouth daily.     colchicine 0.6 MG tablet  Take 1 tablet (0.6 mg total) by mouth daily.     colestipol 1 G tablet  Commonly known as:  COLESTID  Take 2 tablets by mouth daily     dicyclomine 20 MG tablet  Commonly known as:  BENTYL  Take 20 mg by mouth 2 (two) times daily.     enalapril 10 MG tablet  Commonly known as:  VASOTEC  Take 10 mg by mouth 2 (two) times daily.     ESSIAC TONIC PO  Take 1 Dose by mouth 2 (two) times daily. Mixed with 3 oz of distilled water.  Grape Seed 50 MG Caps  Take 2 capsules by mouth daily.     hydrochlorothiazide 25 MG tablet  Commonly known as:   HYDRODIURIL  Take 25 mg by mouth daily.     ibuprofen 200 MG tablet  Commonly known as:  ADVIL,MOTRIN  Take 200 mg by mouth every 8 (eight) hours as needed.     indomethacin 25 MG capsule  Commonly known as:  INDOCIN  Take 25 mg by mouth 3 (three) times daily as needed for moderate pain.     letrozole 2.5 MG tablet  Commonly known as:  FEMARA  Take 2.5 mg by mouth daily.     metFORMIN 500 MG tablet  Commonly known as:  GLUCOPHAGE  Take 500 mg by mouth daily with breakfast.     mirtazapine 15 MG disintegrating tablet  Commonly known as:  REMERON SOL-TAB  Take 1 tablet (15 mg total) by mouth at bedtime.     nystatin cream  Commonly known as:  MYCOSTATIN  Apply 1 application topically daily as needed for dry skin.     nystatin-triamcinolone cream  Commonly known as:  MYCOLOG II  Apply 1 application topically daily.     ondansetron 4 MG tablet  Commonly known as:  ZOFRAN  Take 1 tablet (4 mg total) by mouth every 6 (six) hours as needed for nausea or vomiting.     pantoprazole 40 MG tablet  Commonly known as:  PROTONIX  Take 40 mg by mouth daily.     rosuvastatin 10 MG tablet  Commonly known as:  CRESTOR  Take 10 mg by mouth as directed.           Total Time in preparing paper work, data evaluation and todays exam - 35 minutes  Dustin Flock M.D on 03/10/2015 at 2:56 PM  [facility]   Office  [phone]

## 2015-03-10 NOTE — Progress Notes (Signed)
Telephone report called to Nunzio Cory at [facility].

## 2015-03-10 NOTE — Discharge Instructions (Signed)
°  DIET:  Diabetic, 4g na diet  DISCHARGE CONDITION:  stable  ACTIVITY:  As tolerated  OXYGEN:  none      DISCHARGE LOCATION:  Geriatric pchychiatry unit   ADDITIONAL DISCHARGE INSTRUCTION:   If you experience worsening of your admission symptoms, develop shortness of breath, life threatening emergency, suicidal or homicidal thoughts you must seek medical attention immediately by calling 911 or calling your MD immediately  if symptoms less severe.  You Must read complete instructions/literature along with all the possible adverse reactions/side effects for all the Medicines you take and that have been prescribed to you. Take any new Medicines after you have completely understood and accpet all the possible adverse reactions/side effects.   Please note  You were cared for by a hospitalist during your hospital stay. If you have any questions about your discharge medications or the care you received while you were in the hospital after you are discharged, you can call the unit and asked to speak with the hospitalist on call if the hospitalist that took care of you is not available. Once you are discharged, your primary care physician will handle any further medical issues. Please note that NO REFILLS for any discharge medications will be authorized once you are discharged, as it is imperative that you return to your primary care physician (or establish a relationship with a primary care physician if you do not have one) for your aftercare needs so that they can reassess your need for medications and monitor your lab values.  2

## 2015-03-10 NOTE — Progress Notes (Signed)
Speech Language Pathology Treatment: Dysphagia  Patient Details Name: Paige Dougherty MRN: [id number] DOB: 08/26/1932 Today's Date: 03/10/2015 Time: 1010-1035 SLP Time Calculation (min) (ACUTE ONLY): 25 min  Assessment / Plan / Recommendation Clinical Impression  Pt appears to be tolerating her mech soft current diet w/ no overt s/s of aspiration noted following general aspiration precautions. Discussed aspiration precautions and diet options/preparation of food w/ pt/husband who gave verbal agreement. Pt appears at her baseline w/ swallowing; no further skilled ST services indicated at this time. NSG to reconsult if nec. Pt agreed. NSG updated.    HPI Other Pertinent Information: Pt is awake, alert, and verbally conversive. Husband present this am. Pt's vocal quality continues to improve and she is laughing more during conversation. Pt is feeding herself at meals per her and family report. No reports of discomfort at this time when swallowing. She tolerated her diet upgrade to mech soft last night but has not eaten too much.    Pertinent Vitals Pain Assessment: No/denies pain  SLP Plan  All goals met    Recommendations Diet recommendations: Regular;Dysphagia 3 (mechanical soft);Thin liquid Liquids provided via: Cup;Straw Medication Administration: Whole meds with liquid (or puree if easier to swallow) Supervision: Patient able to self feed (setup assist) Compensations: Slow rate;Small sips/bites Postural Changes and/or Swallow Maneuvers: Seated upright 90 degrees              General recommendations:  (none for ST) Oral Care Recommendations: Oral care BID;Oral care before and after PO;Staff/trained caregiver to provide oral care Follow up Recommendations:  (TBD) Plan: All goals met    GO     Katherine Watson, MS, CCC-SLP  Watson,Katherine 03/10/2015, 2:31 PM    

## 2015-03-10 NOTE — Progress Notes (Signed)
[place] [facility]                                                                                                                                                                                            Patient Demographics   Paige Dougherty, is a 81 y.o. female, DOB - [birth year], NOM:[id number]  Admit date - 03/05/2015   Admitting Physician Lance Coon, MD  Outpatient Primary MD for the patient is BABAOFF, MARC E, MD   LOS - 5  Subjective:  pateient reported she was able to get patient out of bed, she intially stated she had medication in her system from overdose   Review of Systems:    CONSTITUTIONAL: No documented fever. Positive fatigue, positive weakness. No weight gain, no weight loss.  EYES: No blurry or double vision.  ENT: No tinnitus. No postnasal drip. No redness of the oropharynx.  RESPIRATORY: No cough, no wheeze, no hemoptysis. No dyspnea.  CARDIOVASCULAR: No chest pain. No orthopnea. No palpitations. No syncope.  GASTROINTESTINAL: No nausea, no vomiting or diarrhea. No abdominal pain. No melena or hematochezia.  GENITOURINARY:  No urgency. No frequency. No dysuria. No hematuria. No obstructive symptoms. No discharge. No pain. No significant abnormal bleeding ENDOCRINE: No polyuria or nocturia. No heat or cold intolerance.  HEMATOLOGY: No anemia. No bruising. No bleeding. No purpura. No petechiae INTEGUMENTARY: No rashes. No lesions.  MUSCULOSKELETAL: No arthritis. No swelling. No gout.  NEUROLOGIC: No numbness, tingling, or ataxia. No seizure-type activity.  PSYCHIATRIC: Depressed  Vitals:   Filed Vitals:   03/09/15 0523 03/09/15 2132 03/10/15 0503 03/10/15 0827  BP: 128/47 185/68 161/74 158/65  Pulse: 73 90 84 85  Temp: 97.6 F (36.4 C) 97.9 F (36.6 C) 97.5 F (36.4 C) 97.7 F (36.5 C)  TempSrc: Oral Oral Oral Oral  Resp: 20 20 20 16   Height:      Weight: 76.295 kg (168 lb 3.2 oz)     SpO2: 95% 96% 95% 97%     Wt Readings from Last 3 Encounters:  03/09/15 76.295 kg (168 lb 3.2 oz)  02/27/15 77.1 kg (169 lb 15.6 oz)  02/03/15 78 kg (171 lb 15.3 oz)     Intake/Output Summary (Last 24 hours) at 03/10/15 0944 Last data filed at 03/10/15 0930  Gross per 24 hour  Intake   1500 ml  Output   2000 ml  Net   -500 ml    Physical Exam:   GENERAL: Appears very weak no acute distress HEAD, EYES, EARS, NOSE AND THROAT: Atraumatic, normocephalic. Extraocular muscles are intact. Pupils equal and reactive to light.  Sclerae anicteric. No conjunctival injection. No oro-pharyngeal erythema.  NECK: Supple. There is no jugular venous distention. No bruits, no lymphadenopathy, no thyromegaly.  HEART: Regular rate and rhythm,. No murmurs, no rubs, no clicks.  LUNGS: Clear to auscultation bilaterally. No rales or rhonchi. No wheezes.  ABDOMEN: Soft, flat, nontender, nondistended. Has good bowel sounds. No hepatosplenomegaly appreciated.  EXTREMITIES: No evidence of any cyanosis, clubbing, or peripheral edema.  +2 pedal and radial pulses bilaterally.  NEUROLOGIC: Able to follow commands cranial nerves II-12 grossly intact strength 5 out of 5 in all 4 extremities SKIN: Moist and warm with no rashes appreciated.  Psych: Not anxious, LN: No inguinal LN enlargement    Antibiotics   Anti-infectives    None      Medications   Scheduled Meds: . calcium-vitamin D  1 tablet Oral BID  . colchicine  0.6 mg Oral Daily  . colestipol  2 g Oral BID  . heparin  5,000 Units Subcutaneous 3 times per day  . indomethacin  25 mg Oral TID  . insulin aspart  0-9 Units Subcutaneous TID WC  . loratadine  10 mg Oral Daily  . megestrol  400 mg Oral Daily  . methylPREDNISolone  8 mg Oral Daily  . mirtazapine  15 mg Oral QHS  . pantoprazole  40 mg Oral Daily  . PARoxetine  20 mg Oral Daily  . rosuvastatin  10 mg Oral QPM  . sodium chloride  3 mL Intravenous Q12H   Continuous Infusions:   PRN Meds:.   Data  Review:   Micro Results Recent Results (from the past 240 hour(s))  MRSA PCR Screening     Status: None   Collection Time: 03/05/15  4:33 PM  Result Value Ref Range Status   MRSA by PCR NEGATIVE NEGATIVE Final    Comment:        The GeneXpert MRSA Assay (FDA approved for NASAL specimens only), is one component of a comprehensive MRSA colonization surveillance program. It is not intended to diagnose MRSA infection nor to guide or monitor treatment for MRSA infections.     Radiology Reports Dg Abd 1 View  03/07/2015   CLINICAL DATA:  Distended abdomen.  EXAM: ABDOMEN - 1 VIEW  COMPARISON:  03/05/2015.  FINDINGS: NG tube in stable position. Interim resolution of gastric distention. No bowel distention. No free air. Pelvic calcifications consistent phleboliths. Lumbar scoliosis concave right diffuse degenerative change present. Tiny sclerotic density left proximal femur most likely a bone island.  IMPRESSION: 1. NG tube noted with tip in good anatomic position. 2. Interim resolution of gastric distention. No bowel distention noted.   Electronically Signed   By: Marcello Moores  Register   On: 03/07/2015 07:23   Dg Abd 1 View  03/05/2015   CLINICAL DATA:  Readjustment of NG tube  EXAM: ABDOMEN - 1 VIEW  COMPARISON:  03/05/2015  FINDINGS: Enteric tube terminates in the distal gastric body.  Mild gastric distention.  IMPRESSION: Enteric tube terminates in the distal gastric body.   Electronically Signed   By: Julian Hy M.D.   On: 03/05/2015 18:40   Dg Abd 1 View  03/05/2015   CLINICAL DATA:  Overdose on multiple meds.  NG tube placement.  EXAM: ABDOMEN - 1 VIEW  COMPARISON:  None.  FINDINGS: Nasogastric tube is present with tip and side-port over the right upper quadrant likely over the distal stomach/ proximal duodenum.  Bowel gas pattern is nonobstructive. There is no free peritoneal air. There are mild  degenerative changes of the spine. Lung bases are within normal.  IMPRESSION: Nonobstructive  bowel gas pattern.  Nasogastric tube with tip over the right upper quadrant likely over the distal stomach/ proximal duodenum.   Electronically Signed   By: Marin Olp M.D.   On: 03/05/2015 16:46   Dg Chest Port 1 View  03/05/2015   CLINICAL DATA:  Breast cancer.  Overdose.  Intubation.  EXAM: PORTABLE CHEST - 1 VIEW  COMPARISON:  None.  FINDINGS: Endotracheal tube 2 cm from carina. Normal cardiac silhouette. There is mild pulmonary edema pattern centrally. No focal infiltrate or pneumonitis identified. No pneumothorax.  IMPRESSION: 1. Endotracheal tube 2 cm from carina. 2. Mild central venous congestion / pulmonary edema.   Electronically Signed   By: Suzy Bouchard M.D.   On: 03/05/2015 13:35     CBC  Recent Labs Lab 03/05/15 1218 03/06/15 0400 03/07/15 0651  WBC 6.4 7.9 9.0  HGB 12.9 12.1 13.2  HCT 37.7 35.2 39.2  PLT 257 240 269  MCV 91.2 91.1 92.3  MCH 31.1 31.4 31.1  MCHC 34.1 34.5 33.7  RDW 14.1 14.4 14.7*    Chemistries   Recent Labs Lab 03/05/15 1218 03/06/15 0400 03/07/15 0651 03/08/15 0537 03/09/15 0540 03/10/15 0333  NA 131* 132* 135 136 138 135  K 3.3* 3.4* 3.9 4.2 4.4 5.2*  CL 95* 102 100* 103 107 103  CO2 27 20* 24 25 25 24   GLUCOSE 101* 88 133* 115* 117* 152*  BUN 13 15 9 11 12 15   CREATININE 0.61 1.13* 0.79 0.76 0.64 0.78  CALCIUM 9.4 8.8* 9.4 9.4 9.2 9.5  MG  --  1.3* 1.6* 1.5* 1.8 1.7  AST 26  --   --   --   --   --   ALT 25  --   --   --   --   --   ALKPHOS 71  --   --   --   --   --   BILITOT 0.5  --   --   --   --   --    ------------------------------------------------------------------------------------------------------------------ estimated creatinine clearance is 57.3 mL/min (by C-G formula based on Cr of 0.78). ------------------------------------------------------------------------------------------------------------------ No results for input(s): HGBA1C in the last 72  hours. ------------------------------------------------------------------------------------------------------------------ No results for input(s): CHOL, HDL, LDLCALC, TRIG, CHOLHDL, LDLDIRECT in the last 72 hours. ------------------------------------------------------------------------------------------------------------------ No results for input(s): TSH, T4TOTAL, T3FREE, THYROIDAB in the last 72 hours.  Invalid input(s): FREET3 ------------------------------------------------------------------------------------------------------------------ No results for input(s): VITAMINB12, FOLATE, FERRITIN, TIBC, IRON, RETICCTPCT in the last 72 hours.  Coagulation profile No results for input(s): INR, PROTIME in the last 168 hours.  No results for input(s): DDIMER in the last 72 hours.  Cardiac Enzymes No results for input(s): CKMB, TROPONINI, MYOGLOBIN in the last 168 hours.  Invalid input(s): CK ------------------------------------------------------------------------------------------------------------------ Invalid input(s): [place]    1.  Acute respiratory failure with hypoxemia due to drug overdose status post extubation on room air 2. Gastric distention now resolved diet as tolerated 3.OD on mulitple meds Psychiatry evaluation done  , recommends in patient geri pychiatry unit, Husband states he absolutely dosen's want her to go anywhere outside of [place]. I spoke to Dr. Weber Cooks with psychiatry, he will come discuss situation with her and him. Pt stable from medical stand point for d/c  4.  DepressionPsychiatry evaluation 5.  GERD continue PPIs  6. Cancer of breast oncology consult has been placed per admitting doctor  7.  Benign essential HTN When necessary hydralazine  8. Acute gout flare improved  From a medical standpoint stable for discharge      Code Status Orders        Start     Ordered   03/05/15 1624  Full code   Continuous     03/05/15 1623            Consults pulmonary  DVT Prophylaxis heparin  Lab Results  Component Value Date   PLT 269 03/07/2015     Time Spent  96mi minutes  Dustin Flock M.D on 03/10/2015 at 9:44 AM  Between 7am to 6pm - Pager - [phone]  After 6pm go to [email or web address] - password [email or web address] [place] [facility]   Office  [phone]

## 2015-03-10 NOTE — Progress Notes (Signed)
Discharge instruction given to patient, husband and daughter, all three verbalized understanding of medical discharge instructions.  Patient denies pain.  Left AC IV removed.

## 2015-03-10 NOTE — Plan of Care (Signed)
Problem: Discharge Progression Outcomes Goal: Other Discharge Outcomes/Goals Outcome: Progressing Plan of Care Progress to Goal:   Pt is calm and cooperative. Pt has attempted to have a BM during shift. Pt refused her colestipol during shift. Pt has drunk two cups of prune juice and 2 4oz container of apple juice. Pt denies pain. BP remains elevated. No other signs of distress noted. Will continue to monitor.

## 2015-03-10 NOTE — Progress Notes (Signed)
After talking with Dr. Arline Asp, family on board with patient being discharged to geriatric psychiatric unit.

## 2015-03-10 NOTE — Progress Notes (Addendum)
Patient husband very upset about patient placement the Dr. Markus Jarvis informed him of.  Dr. Weber Cooks called and issue discussed.  Per Dr. Weber Cooks Dr. Arline Asp has been involved and will be having family conference.

## 2015-03-10 NOTE — Progress Notes (Signed)
Nutrition Follow-up   INTERVENTION:   Meals and Snacks: Cater to patient preferences; SLP following  Medical Food Supplement Therapy: continue Mighty Shakes as ordered as pt drinking    NUTRITION DIAGNOSIS:   Inadequate oral intake related to acute illness as evidenced by NPO status; improving with diet advancement   GOAL:   Patient will meet greater than or equal to 90% of their needs; ongoing  MONITOR:    (Energy Intake, Anthropometrics, Digestive System, Electrolyte/Renal Profile, Glucose Profile, Pulmonary)   ASSESSMENT:    Pt sitting up in chair on visit this am. SLP had just visited. Pt continues with 1:1 sitter.  Diet Order:  DIET DYS 3 Room service appropriate?: Yes with Assist; Fluid consistency:: Thin    Current Nutrition: Pt reports eating oatmeal and yogurt this am. Per SLP pt did not eat toast. Pt reports drinking 100% of Mighty shake but was holding the orange juice for later. Recorded po intake 60% of meals over the past 3 days. Pt did report drinking Boost at home PTA at times.   Gastrointestinal Profile: Last BM: 03/07/2015   Medications: Calcium-vitamin D, Colace, Novolog, Megace, Solumderol, Remeron, Protonix, Miralax, Senokot  Electrolyte/Renal Profile and Glucose Profile:   Recent Labs Lab 03/08/15 0537 03/09/15 0540 03/10/15 0333  NA 136 138 135  K 4.2 4.4 5.2*  CL 103 107 103  CO2 25 25 24   BUN 11 12 15   CREATININE 0.76 0.64 0.78  CALCIUM 9.4 9.2 9.5  MG 1.5* 1.8 1.7  PHOS 2.4* 2.4* 2.9  GLUCOSE 115* 117* 152*   Protein Profile:  Recent Labs Lab 03/05/15 1218  ALBUMIN 3.4*    Weight Trend since Admission: Filed Weights   03/06/15 0600 03/08/15 0454 03/09/15 0523  Weight: 173 lb 15.1 oz (78.9 kg) 167 lb 12.8 oz (76.114 kg) 168 lb 3.2 oz (76.295 kg)     Skin:  Reviewed, no issues   BMI:  Body mass index is 27.99 kg/(m^2).  Estimated Nutritional Needs:   Kcal:  1371-1619 kcals (BEE 1038, 1.2 AF, 1.1-1.3 IF) using IBW 56.8  kg  Protein:  65-86 g (1.2-1.5 g/kg)   Fluid:  1425-1710 mL (25-30 ml/kg)   EDUCATION NEEDS:   No education needs identified at this time  [place], RD, LDN Pager [phone]

## 2015-03-10 NOTE — Progress Notes (Signed)
Physical Therapy Treatment Patient Details Name: Paige Dougherty MRN: [id number] DOB: 29-Aug-1932 Today's Date: 03/10/2015    History of Present Illness Pt is a 81 year-old female who was admitted to the hospital for medication overdose suspected to be suicide attempt. Pt has a recent diagnosis of breast cancer and had become depressed. Pt was intubated on 03/05/15 for respiratory failure and was extubated on 03/07/15.     PT Comments    Pt has made great progress towards goals this date with much improved mood and outlook on therapy. Her ambulation is both significantly further and of much greater quality, and she shows improved strength with transfers. She does still have ambulation deficits as well as some weakness that will benefit from skilled PT.   Follow Up Recommendations  SNF     Equipment Recommendations  Rolling walker with 5" wheels    Recommendations for Other Services       Precautions / Restrictions Precautions Precautions: Fall Restrictions Weight Bearing Restrictions: No    Mobility  Bed Mobility Overal bed mobility:  (Pt found in chair. Not assessed today)                Transfers Overall transfer level: Needs assistance Equipment used: Rolling walker (2 wheeled) Transfers: Sit to/from Stand Sit to Stand: Min assist         General transfer comment: Pt performs transfer much better this date with much more attention to task and functional strength allowing the therapist to only proivde minimal assistance.   Ambulation/Gait Ambulation/Gait assistance: Min guard Ambulation Distance (Feet): 80 Feet (15 ft to bathroom, then another 65 ft outside in hall) Assistive device: Rolling walker (2 wheeled) Gait Pattern/deviations: Decreased step length - right;Decreased step length - left;Step-through pattern Gait velocity: decreased   General Gait Details: Pt's gait is much smoother this date with more purposeful steps and functinoal step-through pattern.  Needs cueing to lift chest and walk within the walker and not let it get too far outside of her BOS   Stairs            Wheelchair Mobility    Modified Rankin (Stroke Patients Only)       Balance Overall balance assessment: No apparent balance deficits (not formally assessed)                                  Cognition Arousal/Alertness: Awake/alert Behavior During Therapy: WFL for tasks assessed/performed Overall Cognitive Status: Within Functional Limits for tasks assessed                      Exercises Other Exercises Other Exercises: pt was assisted with toileting x 10 minutes with therapist. Pt received cues on reaching during transfers, management of RW in bathroom, and monitored for safety. Pt was able to successfully clean herself following toileting     General Comments General comments (skin integrity, edema, etc.): Pt's affect much improved. Smiling, making jokes, willing to participate with therapy       Pertinent Vitals/Pain Pain Assessment: No/denies pain    Home Living                      Prior Function            PT Goals (current goals can now be found in the care plan section) Acute Rehab PT Goals Patient Stated Goal: To walk to the  bathroom PT Goal Formulation: With patient Time For Goal Achievement: 03/22/15 Potential to Achieve Goals: Good Progress towards PT goals: Progressing toward goals    Frequency  Min 2X/week    PT Plan Current plan remains appropriate    Co-evaluation             End of Session Equipment Utilized During Treatment: Gait belt Activity Tolerance: Patient tolerated treatment well Patient left: in chair;with call bell/phone within reach;with nursing/sitter in room;with family/visitor present     Time: 1116-1140 PT Time Calculation (min) (ACUTE ONLY): 24 min  Charges:                       G CodesJanyth Contes 27-Mar-2015, 3:19 PM  Janyth Contes,  SPT. [phone]

## 2015-03-10 NOTE — Progress Notes (Signed)
Nunzio Cory with [facility] spoke with husband via telephone in regards to directions to facility, visiting hour, and general rules.

## 2015-03-10 NOTE — Progress Notes (Signed)
CSW received call from [facility], spoke to [place], patient will go to 4th floor, accepting MD is Dr. Geanie Kenning.    RN to call report to  [phone] and call [phone] for nonemergency for transportation.  CSW provided family with directions to the hospital.  Casimer Lanius. Latanya Presser, MSW Clinical Social Work Department Emergency Room [phone] 5:07 PM

## 2015-03-10 NOTE — Consult Note (Signed)
[facility] Psychiatry Consult   Reason for Consult:  Follow-up for this 81 year old woman status post suicide attempt follow-up treatment for depression Referring Physician:  Posey Pronto Patient Identification: Paige Dougherty MRN:  [id number] Principal Diagnosis: Acute respiratory failure with hypoxemia Diagnosis:   Patient Active Problem List   Diagnosis Date Noted  . Major depressive disorder, single episode, severe without psychotic features [F32.2]   . Depression, major, single episode, severe [F32.2] 03/08/2015  . Overdose of benzodiazepine [T42.4X1A] 03/05/2015  . Overdose of antidepressant [T43.201A] 03/05/2015  . Overdose of opiate or related narcotic [T40.601A] 03/05/2015  . Suicide attempt [T14.91] 03/05/2015  . Acute respiratory failure with hypoxemia [J96.01] 03/05/2015  . Cancer of breast [C50.919] 12/24/2014  . Urinary incontinence [R32]   . Fibrocystic breast disease [N60.19]   . Chronic headache [R51]   . Benign essential HTN [I10] 02/22/2014  . Anxiety [F41.9] 04/11/2009  . Depression [F32.9] 04/11/2009  . ESOPHAGEAL STRICTURE [K22.2] 04/11/2009  . GERD [K21.9] 04/11/2009  . DIVERTICULOSIS, COLON [K57.30] 04/11/2009  . IRRITABLE BOWEL SYNDROME [K58.9] 04/11/2009  . ABDOMINAL PAIN, UNSPECIFIED SITE [R10.9] 04/11/2009    Total Time spent with patient: 30 minutes  Subjective:   Paige Dougherty is a 81 y.o. female patient admitted with this is an 81 year old woman I have been following on the consult service for major depression status post a suicide attempt.Marland Kitchen  HPI:  Patient reports that she might possibly be feeling slightly better. Affect is still very down and flat. Mood appears to still be low. Speech is minimal in amount. She has made an effort to improve her strength and has walked a little bit today and has had a little bit more to eat.  Reviewed case with psychiatry staff on the inpatient unit here. Nursing have serious concerns about the patient's safety given  her continued weakness and need for a walker to ambulate. They strongly recommend that we refer to a geriatric unit and make the point that it would probably much better serve her needs.  Patient has been referred to geriatric psychiatry units and has been accepted to [facility]. I have spoken with both the patient and her husband. The husband in particular is very opposed to the plan. His stated reason is just that he does not want to go outside of the town of [place] . The alternative plan that he offers is that supposedly he and his daughter would watch the patient 24 hours a day and they would get a psychiatrist to see her as soon as possible. I have pointed out that appointments with a new intake with psychiatrist can take weeks at least to obtain. Also I think that it is unrealistic to expect that they will be able to maintain complete safety in their home to the same degree that could be done in a psychiatric hospital.  Patient's husband wanted to speak to the medical director. I have spoken briefly with Dr. Arline Asp and advised him of the situation. We are working on arranging transport to [place] at this point. No other change to medication. Support and psychoeducation offered. HPI Elements:   Quality:  Depressed mood suicide attempt. Severity:  Severe. Timing:  Worse over the past several months or more. Duration:  Ongoing depression. Context:  Recovery from cancer.  Past Medical History:  Past Medical History  Diagnosis Date  . Diverticulosis   . GERD (gastroesophageal reflux disease)   . Esophageal stricture   . Depression   . Anxiety   . Barrett's esophagus   .  IBS (irritable bowel syndrome)   . Adenomatous colon polyp   . Diabetes mellitus   . Hypertension   . Hyperlipemia   . Allergy   . Arthritis     HANDS/FEET  . Urinary incontinence   . Fibrocystic breast disease   . Chronic headache   . Cancer of breast 12/24/2014    Past Surgical History  Procedure Laterality  Date  . Dilation and curettage of uterus    . Abdominal hysterectomy    . Cataract extraction      bilateral   . Carpal tunnel release      bilateral  . Finger cyst removal    . Neck cyst removal    . Upper gastrointestinal endoscopy    . Colonoscopy     Family History:  Family History  Problem Relation Age of Onset  . Colon cancer Father   . Thyroid disease Father   . Thyroid disease Mother   . Hypertension Mother   . Breast cancer Maternal Grandmother    Social History:  History  Alcohol Use  . Yes    Comment: occasional     History  Drug Use No    History   Social History  . Marital Status: Married    Spouse Name: N/A  . Number of Children: N/A  . Years of Education: N/A   Social History Main Topics  . Smoking status: Never Smoker   . Smokeless tobacco: Never Used  . Alcohol Use: Yes     Comment: occasional  . Drug Use: No  . Sexual Activity: Not on file   Other Topics Concern  . None   Social History Narrative   Additional Social History:                          Allergies:   Allergies  Allergen Reactions  . Avapro [Irbesartan] Other (See Comments)    unknown  . Azithromycin Other (See Comments)    Extreme vaginal burning. unknown  . Clindamycin Other (See Comments)    Vaginal burning  . Duloxetine Hcl Other (See Comments)    Hyperactivity.  . Lipitor [Atorvastatin] Other (See Comments)    "muscle aches"  . Metronidazole Other (See Comments)  . Penicillins   . Prednisone Other (See Comments)    Dizziness/double vision  . Procaine Other (See Comments)    tremors  . Procaine Hcl     tremors  . Eggs Or Egg-Derived Products Other (See Comments)    Other Reaction: Not Assessed  . Other Other (See Comments) and Anxiety    Novacaine weakness  . Statins Rash    Arthralgias.  . Sulfa Antibiotics Rash    Vague history of a sulfa allergy, but does not remember the reaction.  . Tetracycline Rash    Labs:  Results for orders  placed or performed during the hospital encounter of 03/05/15 (from the past 48 hour(s))  Glucose, capillary     Status: Abnormal   Collection Time: 03/08/15  4:57 PM  Result Value Ref Range   Glucose-Capillary 135 (H) 65 - 99 mg/dL   Comment 1 Notify RN   Glucose, capillary     Status: Abnormal   Collection Time: 03/08/15 10:14 PM  Result Value Ref Range   Glucose-Capillary 134 (H) 65 - 99 mg/dL  Glucose, capillary     Status: Abnormal   Collection Time: 03/09/15  4:26 AM  Result Value Ref Range   Glucose-Capillary 111 (  H) 65 - 99 mg/dL  Basic metabolic panel     Status: Abnormal   Collection Time: 03/09/15  5:40 AM  Result Value Ref Range   Sodium 138 135 - 145 mmol/L   Potassium 4.4 3.5 - 5.1 mmol/L   Chloride 107 101 - 111 mmol/L   CO2 25 22 - 32 mmol/L   Glucose, Bld 117 (H) 65 - 99 mg/dL   BUN 12 6 - 20 mg/dL   Creatinine, Ser 2.88 0.44 - 1.00 mg/dL   Calcium 9.2 8.9 - 05.5 mg/dL   GFR calc non Af Amer >60 >60 mL/min   GFR calc Af Amer >60 >60 mL/min    Comment: (NOTE) The eGFR has been calculated using the CKD EPI equation. This calculation has not been validated in all clinical situations. eGFR's persistently <60 mL/min signify possible Chronic Kidney Disease.    Anion gap 6 5 - 15  Phosphorus     Status: Abnormal   Collection Time: 03/09/15  5:40 AM  Result Value Ref Range   Phosphorus 2.4 (L) 2.5 - 4.6 mg/dL  Magnesium     Status: None   Collection Time: 03/09/15  5:40 AM  Result Value Ref Range   Magnesium 1.8 1.7 - 2.4 mg/dL  Glucose, capillary     Status: Abnormal   Collection Time: 03/09/15  7:38 AM  Result Value Ref Range   Glucose-Capillary 110 (H) 65 - 99 mg/dL  Glucose, capillary     Status: Abnormal   Collection Time: 03/09/15 10:08 PM  Result Value Ref Range   Glucose-Capillary 233 (H) 65 - 99 mg/dL  Phosphorus     Status: None   Collection Time: 03/10/15  3:33 AM  Result Value Ref Range   Phosphorus 2.9 2.5 - 4.6 mg/dL  Basic metabolic panel      Status: Abnormal   Collection Time: 03/10/15  3:33 AM  Result Value Ref Range   Sodium 135 135 - 145 mmol/L   Potassium 5.2 (H) 3.5 - 5.1 mmol/L   Chloride 103 101 - 111 mmol/L   CO2 24 22 - 32 mmol/L   Glucose, Bld 152 (H) 65 - 99 mg/dL   BUN 15 6 - 20 mg/dL   Creatinine, Ser 9.86 0.44 - 1.00 mg/dL   Calcium 9.5 8.9 - 09.0 mg/dL   GFR calc non Af Amer >60 >60 mL/min   GFR calc Af Amer >60 >60 mL/min    Comment: (NOTE) The eGFR has been calculated using the CKD EPI equation. This calculation has not been validated in all clinical situations. eGFR's persistently <60 mL/min signify possible Chronic Kidney Disease.    Anion gap 8 5 - 15  Magnesium     Status: None   Collection Time: 03/10/15  3:33 AM  Result Value Ref Range   Magnesium 1.7 1.7 - 2.4 mg/dL  Glucose, capillary     Status: Abnormal   Collection Time: 03/10/15  4:11 AM  Result Value Ref Range   Glucose-Capillary 152 (H) 65 - 99 mg/dL  Glucose, capillary     Status: Abnormal   Collection Time: 03/10/15 11:18 AM  Result Value Ref Range   Glucose-Capillary 181 (H) 65 - 99 mg/dL    Vitals: Blood pressure 158/65, pulse 85, temperature 97.7 F (36.5 C), temperature source Oral, resp. rate 16, height 5\' 5"  (1.651 m), weight 76.295 kg (168 lb 3.2 oz), SpO2 97 %.  Risk to Self: Is patient at risk for suicide?: Yes Risk to Others:  Prior Inpatient Therapy:   Prior Outpatient Therapy:    Current Facility-Administered Medications  Medication Dose Route Frequency Provider Last Rate Last Dose  . calcium-vitamin D (OSCAL WITH D) 500-200 MG-UNIT per tablet 1 tablet  1 tablet Oral BID Dustin Flock, MD   1 tablet at 03/10/15 4163  . colchicine tablet 0.6 mg  0.6 mg Oral Daily Dustin Flock, MD   0.6 mg at 03/10/15 8453  . colestipol (COLESTID) tablet 2 g  2 g Oral BID Dustin Flock, MD   2 g at 03/10/15 6468  . docusate sodium (COLACE) capsule 200 mg  200 mg Oral BID Dustin Flock, MD   200 mg at 03/10/15 1018  .  heparin injection 5,000 Units  5,000 Units Subcutaneous 3 times per day Lance Coon, MD   5,000 Units at 03/10/15 [phone]  . hydrALAZINE (APRESOLINE) injection 10 mg  10 mg Intravenous Q6H PRN Dustin Flock, MD      . ibuprofen (ADVIL,MOTRIN) tablet 200 mg  200 mg Oral Q6H PRN Dustin Flock, MD      . indomethacin (INDOCIN) capsule 25 mg  25 mg Oral TID Dustin Flock, MD   25 mg at 03/10/15 2248  . insulin aspart (novoLOG) injection 0-9 Units  0-9 Units Subcutaneous TID WC Bettey Costa, MD   2 Units at 03/10/15 1221  . loratadine (CLARITIN) tablet 10 mg  10 mg Oral Daily Dustin Flock, MD   10 mg at 03/10/15 2500  . megestrol (MEGACE) 400 MG/10ML suspension 400 mg  400 mg Oral Daily Dustin Flock, MD   400 mg at 03/10/15 3704  . methylPREDNISolone (MEDROL) tablet 2 mg  2 mg Oral Daily Dustin Flock, MD   2 mg at 03/10/15 0955  . mirtazapine (REMERON SOL-TAB) disintegrating tablet 15 mg  15 mg Oral QHS Gonzella Lex, MD   15 mg at 03/09/15 2248  . pantoprazole (PROTONIX) EC tablet 40 mg  40 mg Oral Daily Dustin Flock, MD   40 mg at 03/10/15 8889  . PARoxetine (PAXIL) tablet 20 mg  20 mg Oral Daily Dustin Flock, MD   20 mg at 03/10/15 1694  . phenol (CHLORASEPTIC) mouth spray 2 spray  2 spray Mouth/Throat Q4H PRN Flora Lipps, MD   2 spray at 03/07/15 1307  . polyethylene glycol (MIRALAX / GLYCOLAX) packet 17 g  17 g Oral Daily Dustin Flock, MD   17 g at 03/10/15 1017  . rosuvastatin (CRESTOR) tablet 10 mg  10 mg Oral QPM Dustin Flock, MD   10 mg at 03/08/15 1824  . senna (SENOKOT) tablet 8.6 mg  1 tablet Oral Daily Dustin Flock, MD   8.6 mg at 03/10/15 1018  . sodium chloride 0.9 % injection 3 mL  3 mL Intravenous Q12H Lance Coon, MD   3 mL at 03/10/15 5038    Musculoskeletal: Strength & Muscle Tone: decreased Gait & Station: unsteady Patient leans: N/A  Psychiatric Specialty Exam: Physical Exam  Constitutional: She appears well-developed and well-nourished. She appears  lethargic.  HENT:  Head: Normocephalic and atraumatic.  Eyes: Conjunctivae are normal. Pupils are equal, round, and reactive to light.  Neck: Normal range of motion.  Cardiovascular: Normal heart sounds.   Respiratory: Effort normal.  GI: Soft.  Musculoskeletal: Normal range of motion.  Neurological: She appears lethargic.  Skin: Skin is warm and dry.  Psychiatric: Her mood appears anxious. Her speech is delayed. She is slowed. Cognition and memory are impaired. She expresses impulsivity. She exhibits a depressed mood.  Patient is awake and alert. Speech is still very quiet and minimal in amount. Affect is flat. Energy level improved but still low. Eating a little bit more.    Review of Systems  Constitutional: Negative.   HENT: Negative.   Eyes: Negative.   Respiratory: Negative.   Cardiovascular: Negative.   Gastrointestinal: Negative.   Musculoskeletal: Negative.   Skin: Negative.   Neurological: Negative.   Psychiatric/Behavioral: Positive for depression. The patient is nervous/anxious.     Blood pressure 158/65, pulse 85, temperature 97.7 F (36.5 C), temperature source Oral, resp. rate 16, height $RemoveBe'5\' 5"'[id number]$  (1.651 m), weight 76.295 kg (168 lb 3.2 oz), SpO2 97 %.Body mass index is 27.99 kg/(m^2).  General Appearance: Fairly Groomed  Engineer, water::  Fair  Speech:  Slow  Volume:  Decreased  Mood:  Depressed  Affect:  Flat  Thought Process:  Linear  Orientation:  Full (Time, Place, and Person)  Thought Content:  Negative  Suicidal Thoughts:  Patient is not reporting active suicidal plan but is very depressed. I am concerned about the reliability of her reporting of any suicidal ideation anyway.  Homicidal Thoughts:  No  Memory:  Immediate;   Fair Recent;   Fair Remote;   Fair  Judgement:  Impaired  Insight:  Shallow  Psychomotor Activity:  Decreased  Concentration:  Poor  Recall:  Poor  Fund of Knowledge:Poor  Language: Fair  Akathisia:  No  Handed:  Right  AIMS (if  indicated):     Assets:  Physical Health Social Support  ADL's:  Intact  Cognition: WNL  Sleep:      Medical Decision Making: New problem, with additional work up planned, Review of Psycho-Social Stressors (1), Review and summation of old records (2) and Review of Medication Regimen & Side Effects (2)  Treatment Plan Summary: Medication management and Plan As noted above I believe that the safest plan at this point would be for transfer to a specialized geriatric psychiatry facility which we have in fact arranged. There is some concern about the family's opposition to this plan. I have been in contact with social work. I anticipate that most likely we will proceed with plan this afternoon but we are still working on trying to soothe the family.  Plan:  Recommend psychiatric Inpatient admission when medically cleared. Supportive therapy provided about ongoing stressors. Disposition: See note above  Alethia Berthold 03/10/2015 2:17 PM

## 2015-03-10 NOTE — Progress Notes (Signed)
Recommendation for inpatient psych treatment by Psych MD. Referral information faxed out for Geriatric Placement to,  [facility],  [place],  [place],  [place], [place], [place].  Social work to follow up with facilities .  Casimer Lanius. Latanya Presser, MSW Clinical Social Work Department Emergency Room [phone] 12:19 PM

## 2015-03-10 NOTE — Progress Notes (Signed)
Officer Truman Hayward given verbal report.  Patient taken out via wheelchair by Officer Truman Hayward to be taken to [facility] by [place] car.

## 2015-03-11 DIAGNOSIS — K589 Irritable bowel syndrome without diarrhea: Secondary | ICD-10-CM | POA: Insufficient documentation

## 2015-03-11 DIAGNOSIS — C801 Malignant (primary) neoplasm, unspecified: Secondary | ICD-10-CM | POA: Insufficient documentation

## 2015-03-11 DIAGNOSIS — E119 Type 2 diabetes mellitus without complications: Secondary | ICD-10-CM | POA: Insufficient documentation

## 2015-03-14 DIAGNOSIS — R000 Tachycardia, unspecified: Secondary | ICD-10-CM | POA: Insufficient documentation

## 2015-03-14 DIAGNOSIS — M1A9XX0 Chronic gout, unspecified, without tophus (tophi): Secondary | ICD-10-CM | POA: Insufficient documentation

## 2015-03-14 DIAGNOSIS — M1A00X0 Idiopathic chronic gout, unspecified site, without tophus (tophi): Secondary | ICD-10-CM | POA: Insufficient documentation

## 2015-03-14 DIAGNOSIS — E878 Other disorders of electrolyte and fluid balance, not elsewhere classified: Secondary | ICD-10-CM | POA: Insufficient documentation

## 2015-03-14 DIAGNOSIS — Z853 Personal history of malignant neoplasm of breast: Secondary | ICD-10-CM | POA: Insufficient documentation

## 2015-03-14 DIAGNOSIS — I152 Hypertension secondary to endocrine disorders: Secondary | ICD-10-CM | POA: Insufficient documentation

## 2015-07-03 ENCOUNTER — Inpatient Hospital Stay: Payer: Commercial Managed Care - HMO | Attending: Oncology

## 2015-07-03 ENCOUNTER — Ambulatory Visit
Admission: RE | Admit: 2015-07-03 | Discharge: 2015-07-03 | Disposition: A | Discharge status: Home / Self Care | Payer: Commercial Managed Care - HMO | Source: Ambulatory Visit | Attending: Radiation Oncology | Admitting: Radiation Oncology

## 2015-07-03 ENCOUNTER — Encounter: Payer: Self-pay | Admitting: Radiation Oncology

## 2015-07-03 ENCOUNTER — Inpatient Hospital Stay (HOSPITAL_BASED_OUTPATIENT_CLINIC_OR_DEPARTMENT_OTHER): Payer: Commercial Managed Care - HMO | Admitting: Oncology

## 2015-07-03 ENCOUNTER — Encounter: Payer: Self-pay | Admitting: Oncology

## 2015-07-03 VITALS — BP 154/79 | HR 94 | Temp 96.6°F | Resp 20 | Wt 168.8 lb

## 2015-07-03 DIAGNOSIS — C50919 Malignant neoplasm of unspecified site of unspecified female breast: Secondary | ICD-10-CM

## 2015-07-03 DIAGNOSIS — F329 Major depressive disorder, single episode, unspecified: Secondary | ICD-10-CM | POA: Diagnosis not present

## 2015-07-03 DIAGNOSIS — I10 Essential (primary) hypertension: Secondary | ICD-10-CM | POA: Insufficient documentation

## 2015-07-03 DIAGNOSIS — Z170 Estrogen receptor positive status [ER+]: Secondary | ICD-10-CM

## 2015-07-03 DIAGNOSIS — E119 Type 2 diabetes mellitus without complications: Secondary | ICD-10-CM | POA: Diagnosis not present

## 2015-07-03 DIAGNOSIS — C50911 Malignant neoplasm of unspecified site of right female breast: Secondary | ICD-10-CM

## 2015-07-03 DIAGNOSIS — K589 Irritable bowel syndrome without diarrhea: Secondary | ICD-10-CM | POA: Diagnosis not present

## 2015-07-03 DIAGNOSIS — K219 Gastro-esophageal reflux disease without esophagitis: Secondary | ICD-10-CM | POA: Insufficient documentation

## 2015-07-03 DIAGNOSIS — Z7984 Long term (current) use of oral hypoglycemic drugs: Secondary | ICD-10-CM | POA: Diagnosis not present

## 2015-07-03 DIAGNOSIS — Z79811 Long term (current) use of aromatase inhibitors: Secondary | ICD-10-CM | POA: Diagnosis not present

## 2015-07-03 DIAGNOSIS — C50211 Malignant neoplasm of upper-inner quadrant of right female breast: Secondary | ICD-10-CM | POA: Insufficient documentation

## 2015-07-03 DIAGNOSIS — Z79899 Other long term (current) drug therapy: Secondary | ICD-10-CM | POA: Diagnosis not present

## 2015-07-03 DIAGNOSIS — E785 Hyperlipidemia, unspecified: Secondary | ICD-10-CM | POA: Insufficient documentation

## 2015-07-03 DIAGNOSIS — Z923 Personal history of irradiation: Secondary | ICD-10-CM | POA: Insufficient documentation

## 2015-07-03 LAB — CBC WITH DIFFERENTIAL/PLATELET
BASOS ABS: 0 10*3/uL (ref 0–0.1)
Basophils Relative: 1 %
Eosinophils Absolute: 0.1 10*3/uL (ref 0–0.7)
Eosinophils Relative: 3 %
HEMATOCRIT: 38.5 % (ref 35.0–47.0)
Hemoglobin: 12.9 g/dL (ref 12.0–16.0)
Lymphocytes Relative: 19 %
Lymphs Abs: 1 10*3/uL (ref 1.0–3.6)
MCH: 31.6 pg (ref 26.0–34.0)
MCHC: 33.6 g/dL (ref 32.0–36.0)
MCV: 94.2 fL (ref 80.0–100.0)
Monocytes Absolute: 0.3 10*3/uL (ref 0.2–0.9)
Monocytes Relative: 6 %
NEUTROS ABS: 3.8 10*3/uL (ref 1.4–6.5)
Neutrophils Relative %: 71 %
Platelets: 254 10*3/uL (ref 150–440)
RBC: 4.09 MIL/uL (ref 3.80–5.20)
RDW: 15.3 % — ABNORMAL HIGH (ref 11.5–14.5)
WBC: 5.3 10*3/uL (ref 3.6–11.0)

## 2015-07-03 MED ORDER — LORAZEPAM 0.5 MG PO TABS: 0.5000 mg | ORAL_TABLET | Freq: Two times a day (BID) | ORAL | Status: DC

## 2015-07-03 NOTE — Addendum Note (Signed)
Addended by: Telford Nab on: 07/03/2015 03:46 PM   Modules accepted: Orders

## 2015-07-03 NOTE — Progress Notes (Signed)
[place] @ [facility] [phone]  Fax:([phone]) [place]: [birth year]  MR#: [id number]  JYN#:[id number]  Patient Care Team: Derinda Late, MD as PCP - General (Family Medicine)  CHIEF COMPLAINT:  No chief complaint on file.   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 cT1c (1.9 x 1.6 cm mass) Nx cM0, grade 2. ER positive (>90%), PR positive (>90%), HER2/neu negative (1+ on IHC). On letrozole had radiation therapy Letrozole has been discontinued because of patient's increasing complaining of not able to sleep anxiety may or may not be related to letrozole therapy (July, 2016)     Cancer of breast [facility])   09/23/2014 Initial Diagnosis Cancer of breast    INTERVAL HISTORY:  81 year old lady with a history of carcinoma breast stage I disease estrogen and progesterone receptor positive .  Patient had lumpectomy radiation therapy.  Now on letrozole.  Tolerating treatment very well without any significant aches and pains and bony pains. Taking calcium and vitamin D  Patient tried to commit suicide was admitted in inpatient psychiatric facility now being followed by psychiatrists at  [facility].  Patient had developed severe anxiety depression now on multiple medication with Lexapro, Ativan and Remeron.  Patient relates all her problem to letrozole which I sincerely doubt.  REVIEW OF SYSTEMS:   GENERAL: Stream he apprehensive and depressed lady PERFORMANCE STATUS (ECOG):  01 HEENT:  No visual changes, runny nose, sore throat, mouth sores or tenderness. Lungs: No shortness of breath or cough.  No hemoptysis. Cardiac:  No chest pain, palpitations, orthopnea, or PND. GI:  No nausea, vomiting, diarrhea, constipation, melena or hematochezia. GU:  No urgency, frequency, dysuria, or hematuria. Musculoskeletal: Joint pains.  Back pain.   His muscle tenderness. Extremities:  No pain or swelling. Skin:  No rashes or skin changes. Neuro:  No headache, numbness or weakness, balance or coordination issues. Endocrine:  No diabetes, thyroid issues, hot flashes or night sweats. Psych:  Very anxious lady not in any acute distress Pain: Mild joint pains and back pain Review of systems:  All other systems reviewed and found to be negative.  As per HPI. Otherwise, a complete review of systems is negatve.  PAST MEDICAL HISTORY: Past Medical History  Diagnosis Date  . Diverticulosis   . GERD (gastroesophageal reflux disease)   . Esophageal stricture   . Depression   . Anxiety   . Barrett's esophagus   . IBS (irritable bowel syndrome)   . Adenomatous colon polyp   . Diabetes mellitus   . Hypertension   . Hyperlipemia   . Allergy   . Arthritis     HANDS/FEET  . Urinary incontinence   . Fibrocystic breast disease   . Chronic headache   . Cancer of breast ([place]) 12/24/2014    PAST SURGICAL HISTORY: Past Surgical History  Procedure Laterality Date  . Dilation and curettage of uterus    . Abdominal hysterectomy    . Cataract extraction      bilateral   . Carpal tunnel release      bilateral  . Finger cyst removal    . Neck cyst removal    . Upper gastrointestinal endoscopy    . Colonoscopy      FAMILY HISTORY Family History  Problem Relation Age of Onset  . Colon cancer Father   . Thyroid disease Father   .  Thyroid disease Mother   . Hypertension Mother   . Breast cancer Maternal Grandmother         ADVANCED DIRECTIVES: Patient does have advanced health care directive   HEALTH MAINTENANCE: Social History  Substance Use Topics  . Smoking status: Never Smoker   . Smokeless tobacco: Never Used  . Alcohol Use: Yes     Comment: occasional     Allergies  Allergen Reactions  . Avapro [Irbesartan] Other (See Comments)    unknown  . Azithromycin Other (See Comments)    Extreme vaginal  burning. unknown  . Clindamycin Other (See Comments)    Vaginal burning  . Duloxetine Hcl Other (See Comments)    Hyperactivity.  . Lipitor [Atorvastatin] Other (See Comments)    "muscle aches"  . Metronidazole Other (See Comments)  . Penicillins   . Prednisone Other (See Comments)    Dizziness/double vision  . Procaine Other (See Comments)    tremors  . Procaine Hcl     tremors  . Eggs Or Egg-Derived Products Other (See Comments)    Other Reaction: Not Assessed  . Other Other (See Comments) and Anxiety    Novacaine weakness  . Statins Rash    Arthralgias.  . Sulfa Antibiotics Rash    Vague history of a sulfa allergy, but does not remember the reaction.  . Tetracycline Rash    Current Outpatient Prescriptions  Medication Sig Dispense Refill  . acetaminophen (TYLENOL) 500 MG tablet Take by mouth.    . calcium-vitamin D (OSCAL WITH D) 500-200 MG-UNIT per tablet Take 1 tablet by mouth 2 (two) times daily. 60 tablet 6  . cetirizine (ZYRTEC) 10 MG tablet Take 10 mg by mouth daily.    . colchicine 0.6 MG tablet Take 1 tablet (0.6 mg total) by mouth daily. (Patient not taking: Reported on 07/03/2015) 30 tablet 0  . colestipol (COLESTID) 1 G tablet Take 2 tablets by mouth daily 60 tablet 2  . dicyclomine (BENTYL) 20 MG tablet Take 20 mg by mouth 2 (two) times daily.    . enalapril (VASOTEC) 10 MG tablet Take 10 mg by mouth 2 (two) times daily.    Marland Kitchen escitalopram (LEXAPRO) 10 MG tablet     . Grape Seed 50 MG CAPS Take 2 capsules by mouth daily.    . hydrochlorothiazide (HYDRODIURIL) 25 MG tablet Take 25 mg by mouth daily.    Marland Kitchen ibuprofen (ADVIL,MOTRIN) 200 MG tablet Take 200 mg by mouth every 8 (eight) hours as needed.    . indomethacin (INDOCIN) 25 MG capsule Take 25 mg by mouth 3 (three) times daily as needed for moderate pain.     Marland Kitchen letrozole (FEMARA) 2.5 MG tablet Take 2.5 mg by mouth daily.    . Loperamide HCl (ANTI-DIARRHEAL PO) Take by mouth as needed.    Marland Kitchen LORazepam (ATIVAN)  0.5 MG tablet     . metFORMIN (GLUCOPHAGE) 500 MG tablet Take 500 mg by mouth daily with breakfast.    . metoprolol succinate (TOPROL-XL) 25 MG 24 hr tablet     . mirtazapine (REMERON SOL-TAB) 15 MG disintegrating tablet Take 1 tablet (15 mg total) by mouth at bedtime. 1 tablet 0  . Misc Natural Products (ESSIAC TONIC PO) Take 1 Dose by mouth 2 (two) times daily. Mixed with 3 oz of distilled water.    . Multiple Vitamins-Minerals (CENTRUM SILVER PO) Take 1 tablet by mouth daily.    Marland Kitchen nystatin cream (MYCOSTATIN) Apply 1 application topically daily as needed for  dry skin.    Marland Kitchen nystatin-triamcinolone (MYCOLOG II) cream Apply 1 application topically daily.    Marland Kitchen omeprazole (PRILOSEC) 20 MG capsule     . ondansetron (ZOFRAN) 4 MG tablet Take 1 tablet (4 mg total) by mouth every 6 (six) hours as needed for nausea or vomiting. (Patient not taking: Reported on 07/03/2015) 20 tablet 3  . pantoprazole (PROTONIX) 40 MG tablet Take 40 mg by mouth daily.    . Red Yeast Rice 600 MG CAPS Take by mouth.    . rosuvastatin (CRESTOR) 10 MG tablet Take 10 mg by mouth as directed.     No current facility-administered medications for this visit.    OBJECTIVE:  There were no vitals filed for this visit.   There is no weight on file to calculate BMI.    ECOG FS:1 - Symptomatic but completely ambulatory  PHYSICAL EXAM: GENERAL:  Well developed, well nourished, sitting comfortably in the exam room in no acute distress. MENTAL STATUS:  Alert and oriented to person, place and time. HEAD:   Normocephalic, atraumatic, face symmetric, no Cushingoid features. EYES:  .  Pupils equal round and reactive to light and accomodation.  No conjunctivitis or scleral icterus. ENT:  Oropharynx clear without lesion.  Tongue normal. Mucous membranes moist.  RESPIRATORY:  Clear to auscultation without rales, wheezes or rhonchi. CARDIOVASCULAR:  Regular rate and rhythm without murmur, rub or gallop. BREAST:  Right breast without  masses, skin changes or nipple discharge.  Left breast without masses, skin changes or nipple discharge. ABDOMEN:  Soft, non-tender, with active bowel sounds, and no hepatosplenomegaly.  No masses. BACK:  No CVA tenderness.  No tenderness on percussion of the back or rib cage. SKIN:  No rashes, ulcers or lesions. EXTREMITIES: No edema, no skin discoloration or tenderness.  No palpable cords. LYMPH NODES: No palpable cervical, supraclavicular, axillary or inguinal adenopathy  NEUROLOGICAL: Unremarkable. PSYCH:   Patient was in inpatient psychiatric unit   LAB RESULTS:  Appointment on 07/03/2015  Component Date Value Ref Range Status  . WBC 07/03/2015 5.3  3.6 - 11.0 K/uL Final  . RBC 07/03/2015 4.09  3.80 - 5.20 MIL/uL Final  . Hemoglobin 07/03/2015 12.9  12.0 - 16.0 g/dL Final  . HCT 07/03/2015 38.5  35.0 - 47.0 % Final  . MCV 07/03/2015 94.2  80.0 - 100.0 fL Final  . MCH 07/03/2015 31.6  26.0 - 34.0 pg Final  . MCHC 07/03/2015 33.6  32.0 - 36.0 g/dL Final  . RDW 07/03/2015 15.3* 11.5 - 14.5 % Final  . Platelets 07/03/2015 254  150 - 440 K/uL Final  . Neutrophils Relative % 07/03/2015 71   Final  . Neutro Abs 07/03/2015 3.8  1.4 - 6.5 K/uL Final  . Lymphocytes Relative 07/03/2015 19   Final  . Lymphs Abs 07/03/2015 1.0  1.0 - 3.6 K/uL Final  . Monocytes Relative 07/03/2015 6   Final  . Monocytes Absolute 07/03/2015 0.3  0.2 - 0.9 K/uL Final  . Eosinophils Relative 07/03/2015 3   Final  . Eosinophils Absolute 07/03/2015 0.1  0 - 0.7 K/uL Final  . Basophils Relative 07/03/2015 1   Final  . Basophils Absolute 07/03/2015 0.0  0 - 0.1 K/uL Final        ASSESSMENT: 81 year old lady with history of carcinoma breast stage I disease on letrozole therapy tolerating very well Patient's severe anxiety and depression is not related to anti-hormonal therapy patient has been off entire hormonal therapy for last several weeks.  I had detailed discussion with patient and suggested that she  needs to get psychiatric help.  Appointment has been made for psychiatric history evaluate the patient  Evaluate patient in 3 months for possibility of starting different entire hormonal therapy after patient's psychiatric symptoms resolved  MEDICAL DECISION MAKING:   Patient is here for further follow-up.  Patient is often time hormone therapy had sustained inpatient psychiatric unit.  Considering patient's psychiatric makeup that most of her problems see thought was related to anti-hormonal therapy which I sincerely doubt we would like to hold off any further anti-hormonal therapy until we get psychiatric clearance.  Possibility of tamoxifen or Aromasin can be tried.  Repeat  Mammogram in February.  And reevaluation in 6 month consider further anti-hormonal therapy  Cancer of breast   Staging form: Breast, AJCC 7th Edition     Clinical: Stage IA (T1c, N0, M0) - Signed by Forest Gleason, MD on 12/24/2014   Forest Gleason, MD   07/03/2015 3:13 PM

## 2015-07-03 NOTE — Progress Notes (Signed)
Radiation Oncology Follow up Note  Name: Paige Dougherty   Date:   07/03/2015 MRN:  [id number] DOB: 10-08-1932    This 81 y.o. female presents to the clinic today for follow-up of breast cancer stage I ER/PR positive HER-2/neu negative.  REFERRING Clodagh Odenthal: No ref. Zarayah Lanting found  HPI: Patient is an 81 year old female now out 6 months having completed radiation therapy to her right breast for stage I ER/PR positive HER-2/neu negative invasive mammary carcinoma. She seen today in routine follow-up is doing well she had an attempted suicide which she relates to going on aromatase inhibitor therapy which has been discontinued she is doing better at this time she specifically denies breast tenderness cough or bone pain..  COMPLICATIONS OF TREATMENT: none  FOLLOW UP COMPLIANCE: keeps appointments   PHYSICAL EXAM:  BP 154/79 mmHg  Pulse 94  Temp(Src) 96.6 F (35.9 C)  Resp 20  Wt 168 lb 12.2 oz (76.55 kg) Lungs are clear to A&P cardiac examination essentially unremarkable with regular rate and rhythm. No dominant mass or nodularity is noted in either breast in 2 positions examined. Incision is well-healed. No axillary or supraclavicular adenopathy is appreciated. Cosmetic result is excellent. Well-developed well-nourished patient in NAD. HEENT reveals PERLA, EOMI, discs not visualized.  Oral cavity is clear. No oral mucosal lesions are identified. Neck is clear without evidence of cervical or supraclavicular adenopathy. Lungs are clear to A&P. Cardiac examination is essentially unremarkable with regular rate and rhythm without murmur rub or thrill. Abdomen is benign with no organomegaly or masses noted. Motor sensory and DTR levels are equal and symmetric in the upper and lower extremities. Cranial nerves II through XII are grossly intact. Proprioception is intact. No peripheral adenopathy or edema is identified. No motor or sensory levels are noted. Crude visual fields are within normal  range.  RADIOLOGY RESULTS: Mammograms are reviewed  PLAN: At the present time she is doing better. It was unfortunate what transpired regarding her aromatase inhibitor therapy although that is been discontinued and she feels is feeling better. I'm please were overall progress. I have asked to see her back in 1 year for follow-up. Patient is to call sooner with any concerns.  I would like to take this opportunity for allowing me to participate in the care of your patient.Armstead Peaks., MD

## 2015-07-04 MED ORDER — VITAMIN B-12 1000 MCG PO TABS: 1000.0000 ug | ORAL_TABLET | Freq: Every day | ORAL | Status: AC

## 2015-07-04 MED ORDER — OMEPRAZOLE 20 MG PO CPDR: 20.0000 mg | DELAYED_RELEASE_CAPSULE | Freq: Every day | ORAL | Status: AC

## 2015-07-04 MED ORDER — METOPROLOL SUCCINATE ER 25 MG PO TB24: 25.0000 mg | ORAL_TABLET | Freq: Every day | ORAL | Status: DC

## 2015-07-04 NOTE — Addendum Note (Signed)
Addended by: Telford Nab on: 07/04/2015 04:25 PM   Modules accepted: Orders, Medications

## 2015-07-05 ENCOUNTER — Ambulatory Visit: Payer: Commercial Managed Care - HMO | Admitting: Oncology

## 2015-07-17 ENCOUNTER — Other Ambulatory Visit (HOSPITAL_COMMUNITY): Payer: Self-pay | Admitting: Psychiatry

## 2015-07-25 ENCOUNTER — Other Ambulatory Visit (HOSPITAL_COMMUNITY): Payer: Self-pay | Admitting: Psychiatry

## 2015-08-14 ENCOUNTER — Other Ambulatory Visit (HOSPITAL_COMMUNITY): Payer: Self-pay | Admitting: Psychiatry

## 2015-09-14 ENCOUNTER — Encounter: Payer: Self-pay | Admitting: Internal Medicine

## 2015-09-18 ENCOUNTER — Ambulatory Visit
Admission: RE | Admit: 2015-09-18 | Discharge: 2015-09-18 | Disposition: A | Discharge status: Home / Self Care | Payer: Medicare HMO | Source: Ambulatory Visit | Attending: Oncology | Admitting: Oncology

## 2015-09-18 DIAGNOSIS — C50919 Malignant neoplasm of unspecified site of unspecified female breast: Secondary | ICD-10-CM

## 2015-09-18 DIAGNOSIS — Z853 Personal history of malignant neoplasm of breast: Secondary | ICD-10-CM | POA: Diagnosis not present

## 2015-09-18 DIAGNOSIS — Z9889 Other specified postprocedural states: Secondary | ICD-10-CM | POA: Insufficient documentation

## 2015-09-18 HISTORY — DX: Malignant neoplasm of unspecified site of unspecified female breast: C50.919

## 2015-10-08 ENCOUNTER — Other Ambulatory Visit (HOSPITAL_COMMUNITY): Payer: Self-pay | Admitting: Psychiatry

## 2015-10-30 ENCOUNTER — Other Ambulatory Visit (HOSPITAL_COMMUNITY): Payer: Self-pay | Admitting: Psychiatry

## 2015-10-31 ENCOUNTER — Other Ambulatory Visit (HOSPITAL_COMMUNITY): Payer: Self-pay | Admitting: Psychiatry

## 2015-12-16 IMAGING — CR DG CHEST 1V PORT
1 series · 1 of 1 positions shown · non-contrast
Comparison: None.

CLINICAL DATA: Breast cancer.  Overdose.  Intubation.

EXAM:
PORTABLE CHEST - 1 VIEW

[ap]
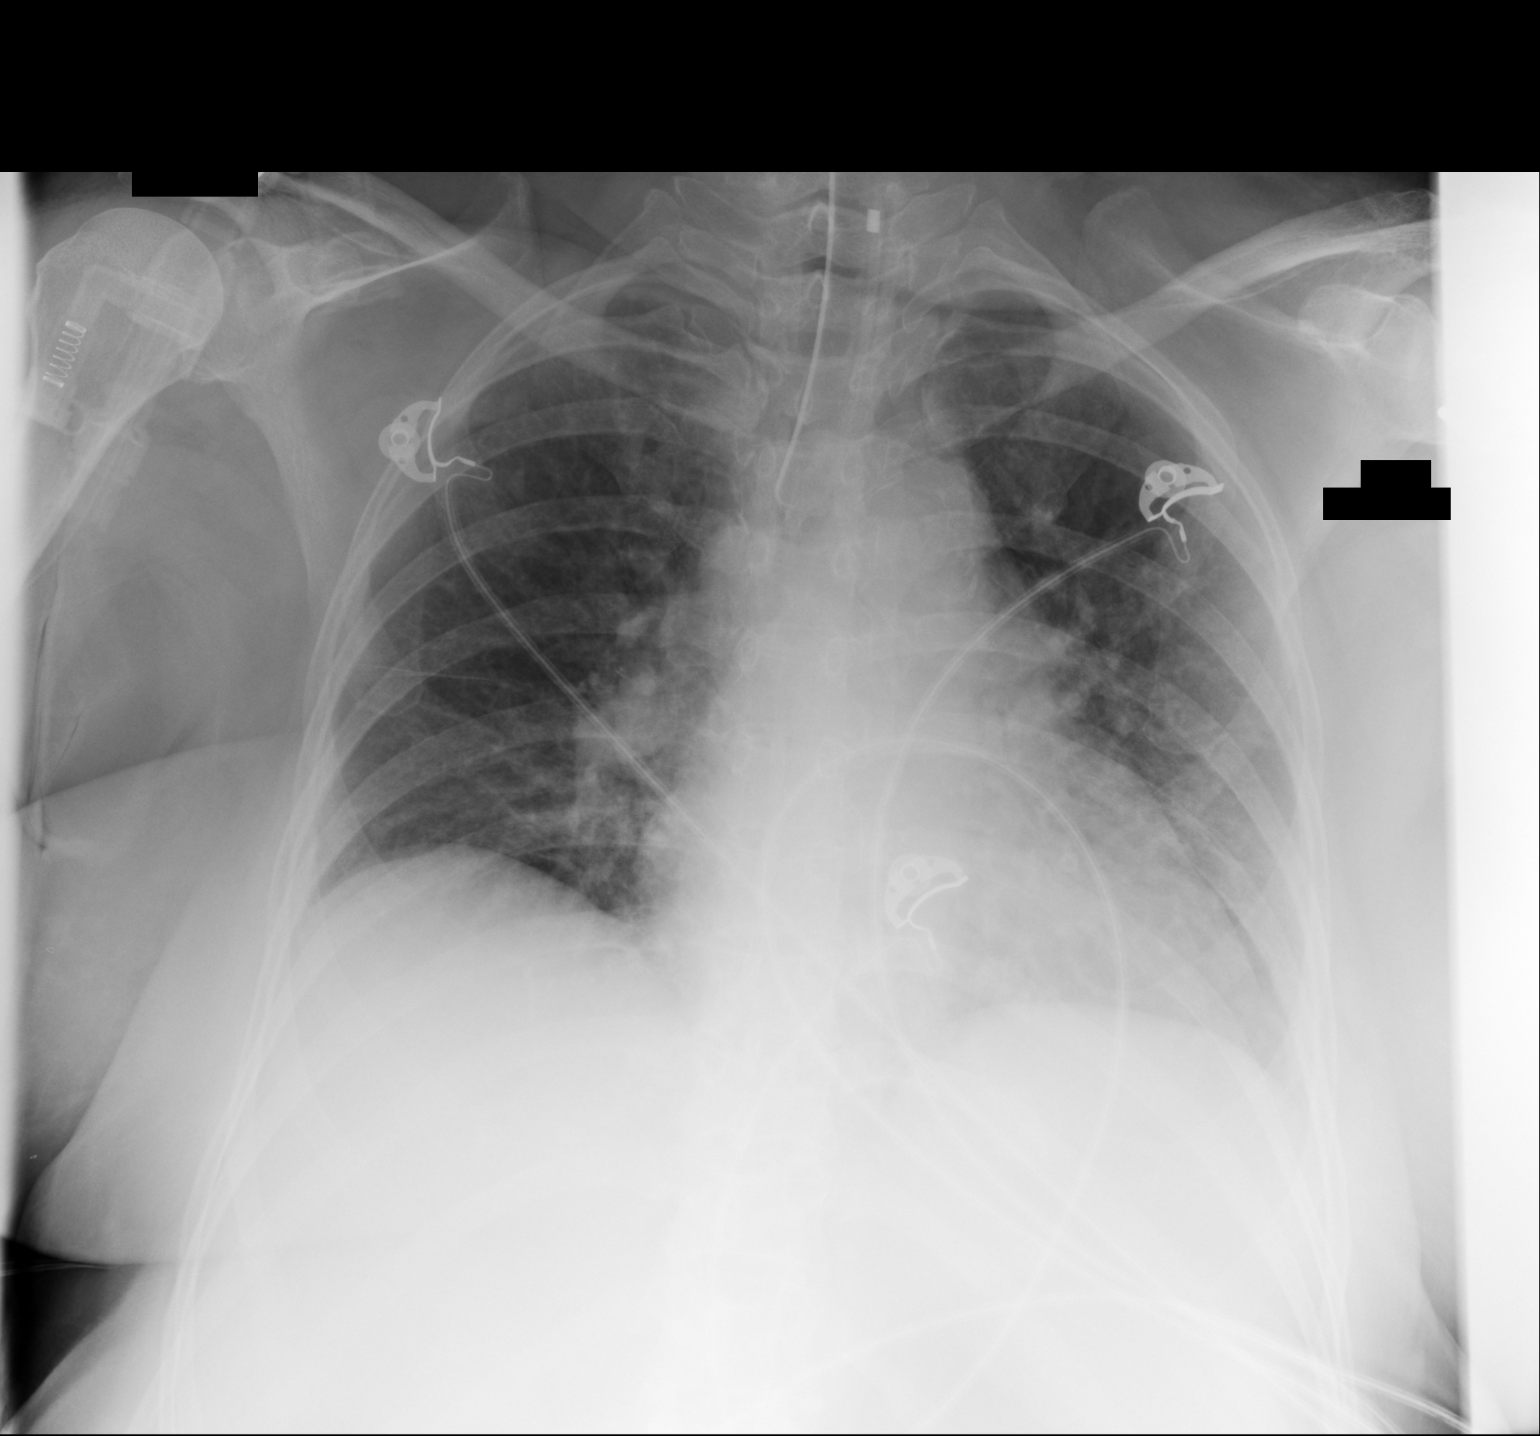

[1 of 1 positions shown; findings below may reference images not displayed]

FINDINGS: Endotracheal tube 2 cm from carina. Normal cardiac silhouette. There
is mild pulmonary edema pattern centrally. No focal infiltrate or
pneumonitis identified. No pneumothorax.
IMPRESSION: 1. Endotracheal tube 2 cm from carina.
2. Mild central venous congestion / pulmonary edema.

## 2015-12-16 IMAGING — CR DG ABDOMEN 1V
1 series · 1 of 1 positions shown · non-contrast
Comparison: None.

CLINICAL DATA: Overdose on multiple meds.  NG tube placement.

EXAM:
ABDOMEN - 1 VIEW

[ap]
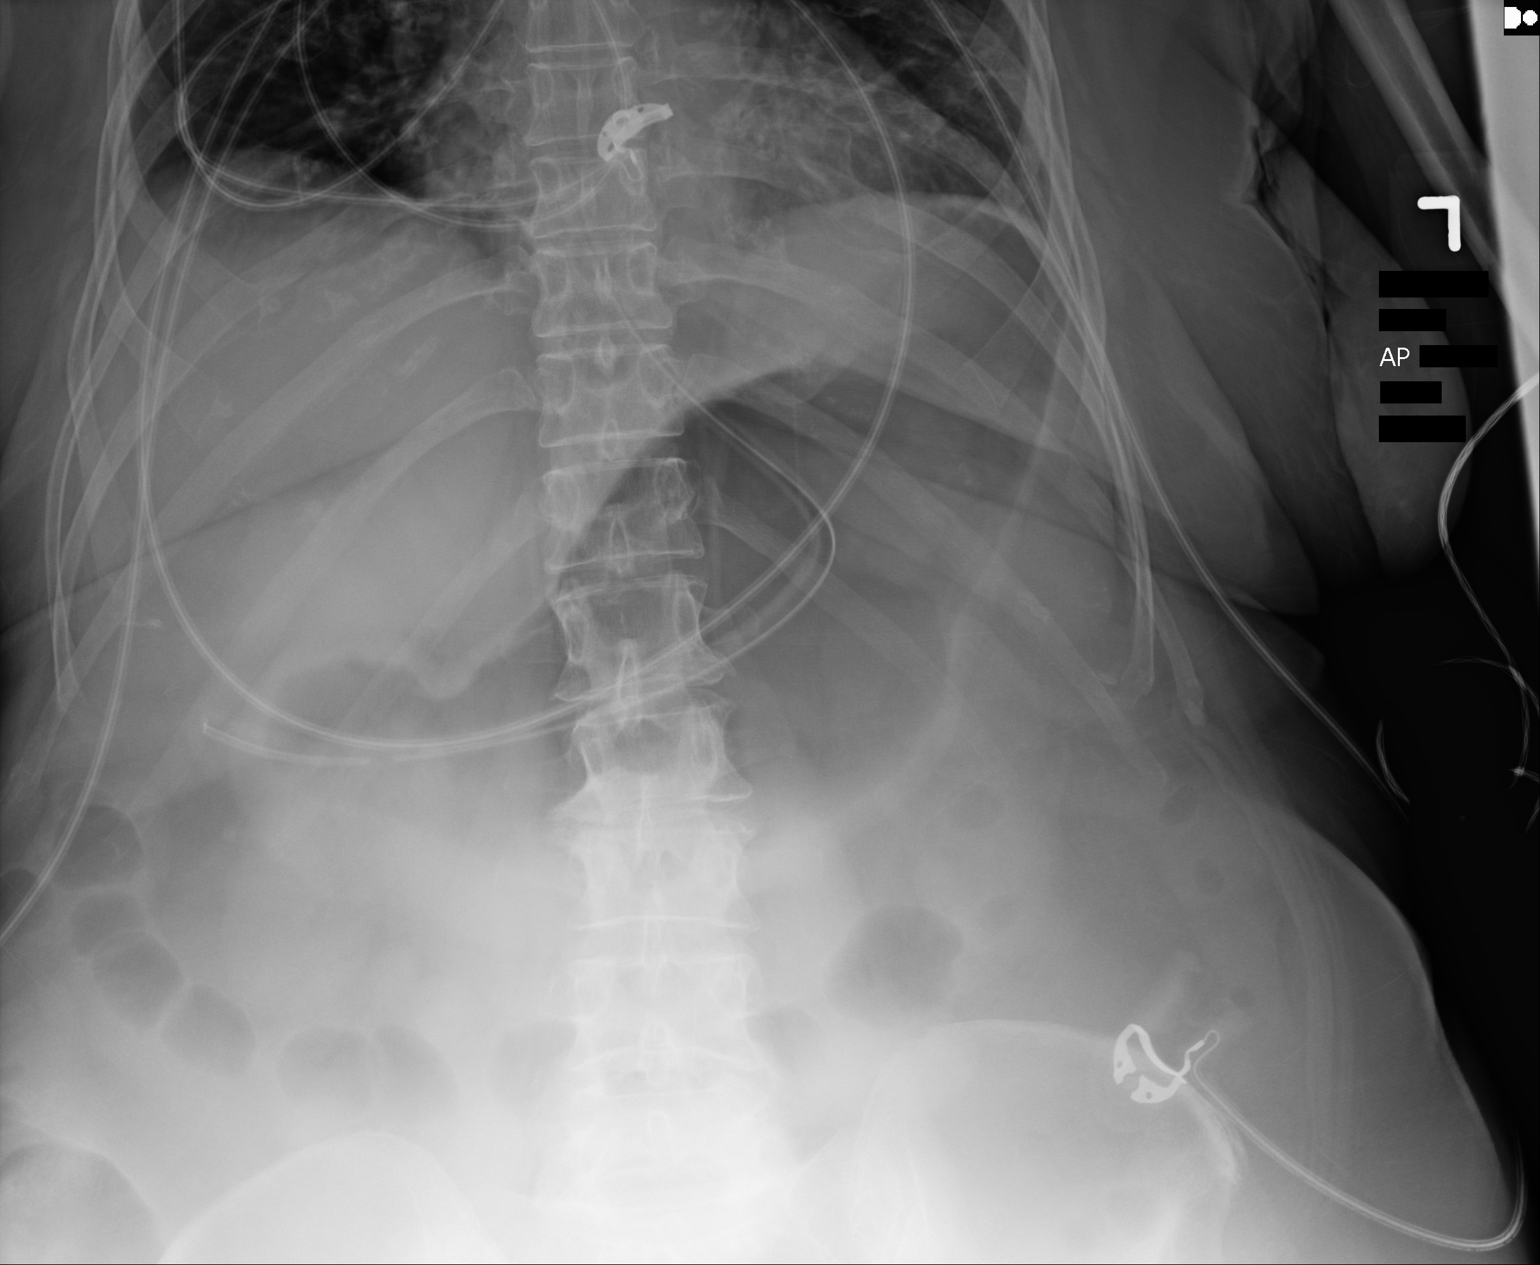

[1 of 1 positions shown; findings below may reference images not displayed]

FINDINGS: Nasogastric tube is present with tip and side-port over the right
upper quadrant likely over the distal stomach/ proximal duodenum.

Bowel gas pattern is nonobstructive. There is no free peritoneal
air. There are mild degenerative changes of the spine. Lung bases
are within normal.
IMPRESSION: Nonobstructive bowel gas pattern.

Nasogastric tube with tip over the right upper quadrant likely over
the distal stomach/ proximal duodenum.

## 2015-12-18 IMAGING — CR DG ABDOMEN 1V
1 series · 1 of 1 positions shown · non-contrast
Comparison: 03/05/2015.

CLINICAL DATA: Distended abdomen.

EXAM:
ABDOMEN - 1 VIEW

[ap]
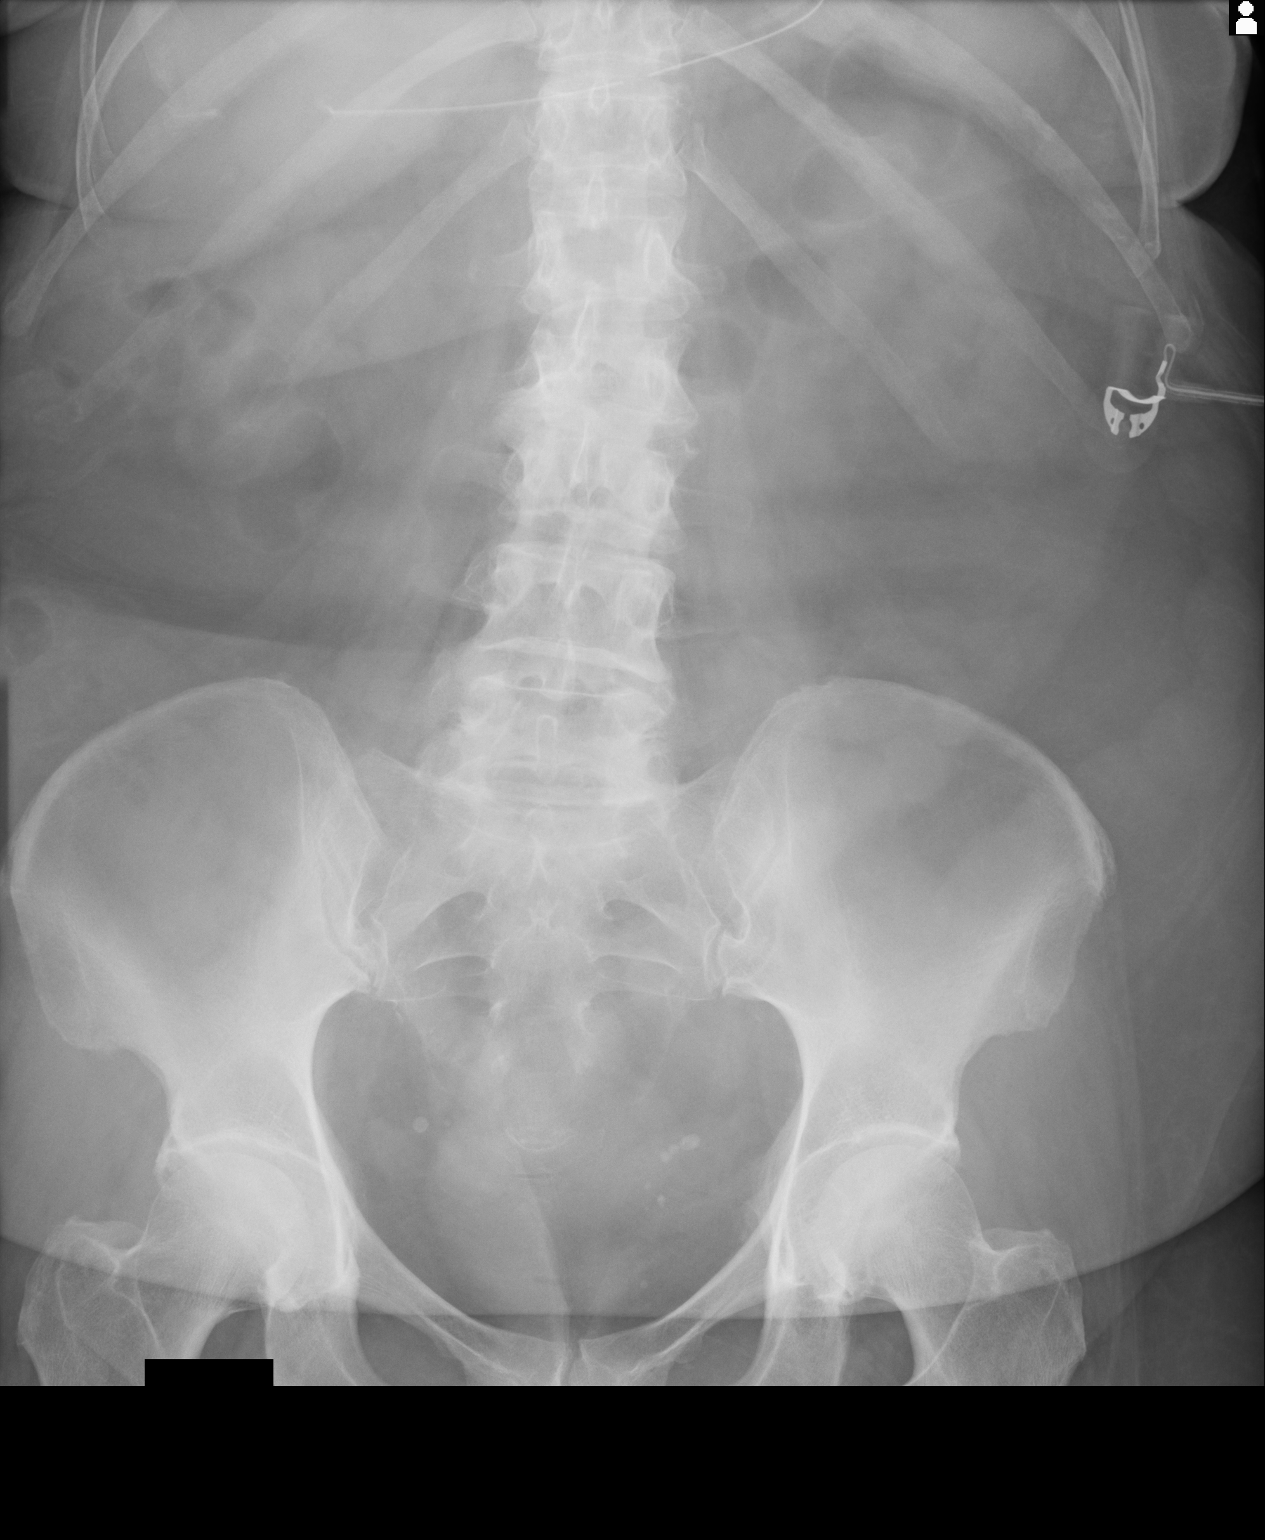

[1 of 1 positions shown; findings below may reference images not displayed]

FINDINGS: NG tube in stable position. Interim resolution of gastric
distention. No bowel distention. No free air. Pelvic calcifications
consistent phleboliths. Lumbar scoliosis concave right diffuse
degenerative change present. Tiny sclerotic density left proximal
femur most likely a bone island.
IMPRESSION: 1. NG tube noted with tip in good anatomic position.
2. Interim resolution of gastric distention. No bowel distention
noted.

## 2016-01-02 ENCOUNTER — Ambulatory Visit: Payer: Commercial Managed Care - HMO | Admitting: Family Medicine

## 2016-01-02 ENCOUNTER — Other Ambulatory Visit: Payer: Commercial Managed Care - HMO

## 2016-01-04 ENCOUNTER — Other Ambulatory Visit: Payer: Commercial Managed Care - HMO

## 2016-01-04 ENCOUNTER — Ambulatory Visit: Payer: Commercial Managed Care - HMO | Admitting: Family Medicine

## 2016-01-17 ENCOUNTER — Other Ambulatory Visit: Payer: Commercial Managed Care - HMO

## 2016-01-17 ENCOUNTER — Ambulatory Visit: Payer: Commercial Managed Care - HMO | Admitting: Internal Medicine

## 2016-01-19 ENCOUNTER — Other Ambulatory Visit: Payer: Self-pay | Admitting: *Deleted

## 2016-01-19 DIAGNOSIS — C50919 Malignant neoplasm of unspecified site of unspecified female breast: Secondary | ICD-10-CM

## 2016-01-22 ENCOUNTER — Inpatient Hospital Stay: Payer: Medicare HMO

## 2016-01-22 ENCOUNTER — Inpatient Hospital Stay: Payer: Medicare HMO | Attending: Internal Medicine | Admitting: Internal Medicine

## 2016-01-22 VITALS — BP 139/81 | HR 69 | Temp 98.1°F | Resp 19 | Wt 165.8 lb

## 2016-01-22 DIAGNOSIS — I10 Essential (primary) hypertension: Secondary | ICD-10-CM | POA: Insufficient documentation

## 2016-01-22 DIAGNOSIS — Z79899 Other long term (current) drug therapy: Secondary | ICD-10-CM | POA: Diagnosis not present

## 2016-01-22 DIAGNOSIS — K589 Irritable bowel syndrome without diarrhea: Secondary | ICD-10-CM | POA: Insufficient documentation

## 2016-01-22 DIAGNOSIS — Z170 Estrogen receptor positive status [ER+]: Secondary | ICD-10-CM | POA: Diagnosis not present

## 2016-01-22 DIAGNOSIS — C50911 Malignant neoplasm of unspecified site of right female breast: Secondary | ICD-10-CM

## 2016-01-22 DIAGNOSIS — M1990 Unspecified osteoarthritis, unspecified site: Secondary | ICD-10-CM | POA: Diagnosis not present

## 2016-01-22 DIAGNOSIS — K219 Gastro-esophageal reflux disease without esophagitis: Secondary | ICD-10-CM | POA: Insufficient documentation

## 2016-01-22 DIAGNOSIS — Z923 Personal history of irradiation: Secondary | ICD-10-CM | POA: Diagnosis not present

## 2016-01-22 DIAGNOSIS — E785 Hyperlipidemia, unspecified: Secondary | ICD-10-CM | POA: Insufficient documentation

## 2016-01-22 DIAGNOSIS — C50211 Malignant neoplasm of upper-inner quadrant of right female breast: Secondary | ICD-10-CM | POA: Insufficient documentation

## 2016-01-22 DIAGNOSIS — E119 Type 2 diabetes mellitus without complications: Secondary | ICD-10-CM | POA: Insufficient documentation

## 2016-01-22 DIAGNOSIS — C50919 Malignant neoplasm of unspecified site of unspecified female breast: Secondary | ICD-10-CM

## 2016-01-22 LAB — CBC WITH DIFFERENTIAL/PLATELET
BASOS ABS: 0 10*3/uL (ref 0–0.1)
BASOS PCT: 1 %
Eosinophils Absolute: 0.1 10*3/uL (ref 0–0.7)
Eosinophils Relative: 3 %
HEMATOCRIT: 37.5 % (ref 35.0–47.0)
HEMOGLOBIN: 12.8 g/dL (ref 12.0–16.0)
Lymphocytes Relative: 27 %
Lymphs Abs: 1.3 10*3/uL (ref 1.0–3.6)
MCH: 31.4 pg (ref 26.0–34.0)
MCHC: 34 g/dL (ref 32.0–36.0)
MCV: 92.3 fL (ref 80.0–100.0)
Monocytes Absolute: 0.3 10*3/uL (ref 0.2–0.9)
Monocytes Relative: 6 %
NEUTROS ABS: 3.1 10*3/uL (ref 1.4–6.5)
NEUTROS PCT: 63 %
Platelets: 208 10*3/uL (ref 150–440)
RBC: 4.07 MIL/uL (ref 3.80–5.20)
RDW: 15.4 % — ABNORMAL HIGH (ref 11.5–14.5)
WBC: 4.9 10*3/uL (ref 3.6–11.0)

## 2016-01-22 LAB — COMPREHENSIVE METABOLIC PANEL
ALBUMIN: 3.9 g/dL (ref 3.5–5.0)
ALT: 27 U/L (ref 14–54)
AST: 31 U/L (ref 15–41)
Alkaline Phosphatase: 75 U/L (ref 38–126)
Anion gap: 7 (ref 5–15)
BUN: 27 mg/dL — AB (ref 6–20)
CALCIUM: 10.1 mg/dL (ref 8.9–10.3)
CO2: 26 mmol/L (ref 22–32)
Chloride: 106 mmol/L (ref 101–111)
Creatinine, Ser: 1.38 mg/dL — ABNORMAL HIGH (ref 0.44–1.00)
GFR calc non Af Amer: 35 mL/min — ABNORMAL LOW (ref >60)
GFR, EST AFRICAN AMERICAN: 41 mL/min — AB (ref >60)
Glucose, Bld: 120 mg/dL — ABNORMAL HIGH (ref 65–99)
POTASSIUM: 4.1 mmol/L (ref 3.5–5.1)
SODIUM: 139 mmol/L (ref 135–145)
TOTAL PROTEIN: 6.9 g/dL (ref 6.5–8.1)
Total Bilirubin: 0.8 mg/dL (ref 0.3–1.2)

## 2016-01-22 NOTE — Progress Notes (Signed)
RN Chaperoned provider with Breast Exam.   

## 2016-01-22 NOTE — Progress Notes (Signed)
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 cT1c (1.9 x 1.6 cm mass) Nx cM0, grade 2. ER positive (>90%), PR positive (>90%), HER2/neu negative (1+ on IHC). On letrozole had radiation therapy Letrozole has been discontinued because of patient's increasing complaining of not able to sleep anxiety may or may not be related to letrozole therapy (July, 2016)  # PSYCHIATRIC ILLNESS/Depression/suicide attempt     Cancer of breast ([place])   09/23/2014 Initial Diagnosis Cancer of breast    Breast cancer of upper-inner quadrant of right female breast ([place])   01/22/2016 Initial Diagnosis Breast cancer of upper-inner quadrant of right female breast ([place])      INTERVAL HISTORY:  Paige Dougherty 82 y.o.  female pleasant patient above history of Breast cancer stage I; also history of significant psychiatric illness depression with attempted suicide- is here for follow-up.  Patient has taken herself off Arimidex approximately 6 months ago because of intolerance/depression.  She continues to be depressed however denies any suicidal ideation. Her appetite is fair. No weight loss. No new lumps or bumps. No chest pain or shortness of breath or cough.  REVIEW OF SYSTEMS:  A complete 10 point review of system is done which is negative except mentioned above/history of present illness.   PAST MEDICAL HISTORY :  Past Medical History  Diagnosis Date  . Diverticulosis   . GERD (gastroesophageal reflux disease)   . Esophageal stricture   . Depression   . Anxiety   . Barrett's esophagus   . IBS (irritable bowel syndrome)    . Diabetes mellitus   . Hypertension   . Hyperlipemia   . Allergy   . Arthritis     HANDS/FEET  . Urinary incontinence   . Fibrocystic breast disease   . Chronic headache   . Cancer of breast ([place]) 12/24/2014    radiation  . Breast cancer ([place])     PAST SURGICAL HISTORY :   Past Surgical History  Procedure Laterality Date  . Dilation and curettage of uterus    . Abdominal hysterectomy    . Cataract extraction      bilateral   . Carpal tunnel release      bilateral  . Finger cyst removal    . Neck cyst removal    . Upper gastrointestinal endoscopy    . Colonoscopy    . Breast biopsy Bilateral     core bxs  . Breast excisional biopsy Right 2016    +    FAMILY HISTORY :   Family History  Problem Relation Age of Onset  . Colon cancer Father   . Thyroid disease Father   . Thyroid disease Mother   . Hypertension Mother   . Colon cancer Maternal Grandmother     SOCIAL HISTORY:   Social History  Substance Use Topics  . Smoking status: Never Smoker   . Smokeless tobacco: Never Used  . Alcohol Use: Yes     Comment: occasional    ALLERGIES:  is allergic to avapro; azithromycin; clindamycin; duloxetine hcl; lipitor; metronidazole; penicillins; prednisone; procaine; procaine hcl; eggs or  egg-derived products; other; statins; sulfa antibiotics; and tetracycline.  MEDICATIONS:  Current Outpatient Prescriptions  Medication Sig Dispense Refill  . calcium-vitamin D (OSCAL WITH D) 500-200 MG-UNIT per tablet Take 1 tablet by mouth 2 (two) times daily. 60 tablet 6  . enalapril (VASOTEC) 10 MG tablet Take 10 mg by mouth 2 (two) times daily.    Marland Kitchen escitalopram (LEXAPRO) 10 MG tablet     . hydrochlorothiazide (HYDRODIURIL) 25 MG tablet Take 25 mg by mouth daily.    . indomethacin (INDOCIN) 25 MG capsule Take 25 mg by mouth 3 (three) times daily as needed.    Marland Kitchen LORazepam (ATIVAN) 0.5 MG tablet Take 1 tablet (0.5 mg total) by mouth 2 (two) times daily. 30 tablet   . metFORMIN  (GLUCOPHAGE) 500 MG tablet Take 500 mg by mouth daily with breakfast.    . metoprolol succinate (TOPROL-XL) 25 MG 24 hr tablet Take 1 tablet (25 mg total) by mouth daily.    . mirtazapine (REMERON SOL-TAB) 15 MG disintegrating tablet Take 1 tablet (15 mg total) by mouth at bedtime. 1 tablet 0  . omeprazole (PRILOSEC) 20 MG capsule Take 1 capsule (20 mg total) by mouth daily.    . Red Yeast Rice 600 MG CAPS Take by mouth.    . vitamin B-12 (CYANOCOBALAMIN) 1000 MCG tablet Take 1 tablet (1,000 mcg total) by mouth daily.     No current facility-administered medications for this visit.    PHYSICAL EXAMINATION: ECOG PERFORMANCE STATUS: 0 - Asymptomatic  BP 139/81 mmHg  Pulse 69  Temp(Src) 98.1 F (36.7 C) (Tympanic)  Resp 19  Wt 165 lb 12.6 oz (75.2 kg)  Filed Weights   01/22/16 1424  Weight: 165 lb 12.6 oz (75.2 kg)    GENERAL: Well-nourished well-developed; Alert, no distress and comfortable.   Alone. EYES: no pallor or icterus OROPHARYNX: no thrush or ulceration; good dentition  NECK: supple, no masses felt LYMPH:  no palpable lymphadenopathy in the cervical, axillary or inguinal regions LUNGS: clear to auscultation and  No wheeze or crackles HEART/CVS: regular rate & rhythm and no murmurs; No lower extremity edema ABDOMEN:abdomen soft, non-tender and normal bowel sounds Musculoskeletal:no cyanosis of digits and no clubbing  PSYCH: alert & oriented x 3 with fluent speech NEURO: no focal motor/sensory deficits SKIN:  no rashes or significant lesions Right and left BREAST exam [in the presence of nurse]- no unusual skin changes or dominant masses felt. Surgical scars noted.  LABORATORY DATA:  I have reviewed the data as listed    Component Value Date/Time   NA 139 01/22/2016 1345   K 4.1 01/22/2016 1345   CL 106 01/22/2016 1345   CO2 26 01/22/2016 1345   GLUCOSE 120* 01/22/2016 1345   BUN 27* 01/22/2016 1345   CREATININE 1.38* 01/22/2016 1345   CALCIUM 10.1 01/22/2016  1345   PROT 6.9 01/22/2016 1345   ALBUMIN 3.9 01/22/2016 1345   AST 31 01/22/2016 1345   ALT 27 01/22/2016 1345   ALKPHOS 75 01/22/2016 1345   BILITOT 0.8 01/22/2016 1345   GFRNONAA 35* 01/22/2016 1345   GFRAA 41* 01/22/2016 1345    No results found for: SPEP, UPEP  Lab Results  Component Value Date   WBC 4.9 01/22/2016   NEUTROABS 3.1 01/22/2016   HGB 12.8 01/22/2016   HCT 37.5 01/22/2016   MCV 92.3 01/22/2016   PLT 208 01/22/2016      Chemistry      Component Value Date/Time   NA 139 01/22/2016  1345   K 4.1 01/22/2016 1345   CL 106 01/22/2016 1345   CO2 26 01/22/2016 1345   BUN 27* 01/22/2016 1345   CREATININE 1.38* 01/22/2016 1345      Component Value Date/Time   CALCIUM 10.1 01/22/2016 1345   ALKPHOS 75 01/22/2016 1345   AST 31 01/22/2016 1345   ALT 27 01/22/2016 1345   BILITOT 0.8 01/22/2016 1345       RADIOGRAPHIC STUDIES: I have personally reviewed the radiological images as listed and agreed with the findings in the report. No results found.   ASSESSMENT & PLAN:  Breast cancer of upper-inner quadrant of right female breast ([place]) Stage I ER/PR positive HER-2/neu negative breast cancer right side- clinically no evidence of recurrence noted. Patient not on adjuvant therapy because of intolerance/psychiatric illness.  I discussed the importance of going back on antihormone therapy- patient declines. States that she knows that "all 3 medications" can cause same side effects. And wishes not to proceed with the other antihormone therapy. She understands that the risk of recurrence; she states "I'm ready to go".   With regards to follow-up- patient is reluctant following up here. I have advised her to follow up with Dr. Conservation officer, nature diligently. She agrees. Follow-up here only as needed.     Orders Placed This Encounter  Procedures  . US Breast Complete Uni Left Inc Axilla    Standing Status: Future     Number of Occurrences:      Standing Expiration  Date: 03/23/2017    Order Specific Question:  Reason for Exam (SYMPTOM  OR DIAGNOSIS REQUIRED)    Answer:  right lumpectomy for breast carcinoma    Order Specific Question:  Preferred imaging location?    Answer:  [facility]  . US Breast Complete Uni Right Inc Axilla    Standing Status: Future     Number of Occurrences:      Standing Expiration Date: 03/23/2017    Order Specific Question:  Reason for Exam (SYMPTOM  OR DIAGNOSIS REQUIRED)    Answer:  right lumpectomy for breast carcinoma    Order Specific Question:  [facility]?    [facility] MM Digital Diagnostic Bilat    Standing Status: Future     Number of Occurrences:      Standing Expiration Date: 01/21/2017    Order Specific Question:  Reason for Exam (SYMPTOM  OR DIAGNOSIS REQUIRED)    Answer:  right lumpectomy for breast carcinoma    Order Specific Question:  Preferred imaging location?    Answer:  [facility]   All questions were answered. The patient knows to call the clinic with any problems, questions or concerns.      Cammie Sickle, MD 01/22/2016 6:00 PM

## 2016-01-22 NOTE — Progress Notes (Signed)
Patient here for follow up of breast cancer and discussion of continuing or not continuing AI therapy. Patient attempted suicide as a result of letrozole according to patient. She no longer feels suicidal. She states she is no longer seeing Dr Peri Maris, her phychiatrist in [place]. She does feel depressed but states it is because her daughter is going to get divorced. She states her husband "doles out" her medications. She does want a breast exam today. She states she really doesn't  Understand why she needs to continue coming to cancer center. She has a decreased appetite. Denies any pain.

## 2016-01-22 NOTE — Assessment & Plan Note (Addendum)
Stage I ER/PR positive HER-2/neu negative breast cancer right side- clinically no evidence of recurrence noted. Patient not on adjuvant therapy because of intolerance/psychiatric illness.  I discussed the importance of going back on antihormone therapy- patient declines. States that she knows that "all 3 medications" can cause same side effects. And wishes not to proceed with the other antihormone therapy. She understands that the risk of recurrence; she states "I'm ready to go".   With regards to follow-up- patient is reluctant following up here. I have advised her to follow up with Dr. Conservation officer, nature diligently. She agrees. Follow-up here only as needed.   # Elevated creatinine 1.37- previous creatinine within normal limits recommend patient drinks increased fluids. She'll follow up with PCP. She agrees.  # No follow-up is made in the clinic.

## 2016-07-01 ENCOUNTER — Encounter: Payer: Self-pay | Admitting: Radiation Oncology

## 2016-07-01 ENCOUNTER — Ambulatory Visit
Admission: RE | Admit: 2016-07-01 | Discharge: 2016-07-01 | Disposition: A | Discharge status: Home / Self Care | Payer: Medicare HMO | Source: Ambulatory Visit | Attending: Radiation Oncology | Admitting: Radiation Oncology

## 2016-07-01 VITALS — BP 120/66 | HR 72 | Temp 96.9°F | Resp 20 | Wt 148.1 lb

## 2016-07-01 DIAGNOSIS — Z853 Personal history of malignant neoplasm of breast: Secondary | ICD-10-CM | POA: Insufficient documentation

## 2016-07-01 DIAGNOSIS — Z923 Personal history of irradiation: Secondary | ICD-10-CM | POA: Diagnosis not present

## 2016-07-01 DIAGNOSIS — C50411 Malignant neoplasm of upper-outer quadrant of right female breast: Secondary | ICD-10-CM

## 2016-07-01 DIAGNOSIS — Z170 Estrogen receptor positive status [ER+]: Secondary | ICD-10-CM

## 2016-07-01 NOTE — Progress Notes (Signed)
Radiation Oncology Follow up Note  Name: Paige Dougherty   Date:   07/01/2016 MRN:  [id number] DOB: 05/01/1933    This 82 y.o. female presents to the clinic today for 18 month follow-up. Status post whole breast radiation to her right breast for stage I ER/PR positive invasive mammary carcinoma  REFERRING PROVIDER: Derinda Late, MD  HPI: Patient is an 82 year old female now seen out 18 months having completed whole breast radiation to her right breast for stage I ER/PR positive HER-2/neu not overexpressed invasive mammary carcinoma she is seen today in routine follow-up and is doing well. She specifically denies breast tenderness cough or bone pain. She has follow-up mammograms in February her prior ones have been fine. She has stopped taking aromatase inhibitor therapy based on the side effect profile..  COMPLICATIONS OF TREATMENT: none  FOLLOW UP COMPLIANCE: keeps appointments   PHYSICAL EXAM:  BP 120/66   Pulse 72   Temp (!) 96.9 F (36.1 C)   Resp 20   Wt 148 lb 2.4 oz (67.2 kg)   BMI 24.65 kg/m  Lungs are clear to A&P cardiac examination essentially unremarkable with regular rate and rhythm. No dominant mass or nodularity is noted in either breast in 2 positions examined. Incision is well-healed. No axillary or supraclavicular adenopathy is appreciated. Cosmetic result is excellent. Well-developed well-nourished patient in NAD. HEENT reveals PERLA, EOMI, discs not visualized.  Oral cavity is clear. No oral mucosal lesions are identified. Neck is clear without evidence of cervical or supraclavicular adenopathy. Lungs are clear to A&P. Cardiac examination is essentially unremarkable with regular rate and rhythm without murmur rub or thrill. Abdomen is benign with no organomegaly or masses noted. Motor sensory and DTR levels are equal and symmetric in the upper and lower extremities. Cranial nerves II through XII are grossly intact. Proprioception is intact. No peripheral adenopathy or  edema is identified. No motor or sensory levels are noted. Crude visual fields are within normal range.  RADIOLOGY RESULTS: Prior mammograms are reviewed from February 2017 and are BI-RADS 2 benign  PLAN: Present time she continues to do well with no evidence of disease. I've asked to see her out in 1 year for follow-up after her next mammograms are performed. Patient knows to call sooner with any concerns.  I would like to take this opportunity to thank you for allowing me to participate in the care of your patient.Armstead Peaks., MD

## 2016-08-13 ENCOUNTER — Other Ambulatory Visit: Payer: Self-pay | Admitting: Surgery

## 2016-08-13 DIAGNOSIS — Z853 Personal history of malignant neoplasm of breast: Secondary | ICD-10-CM

## 2016-08-16 ENCOUNTER — Other Ambulatory Visit: Payer: Self-pay | Admitting: Family Medicine

## 2016-08-16 DIAGNOSIS — R634 Abnormal weight loss: Secondary | ICD-10-CM

## 2016-08-23 ENCOUNTER — Ambulatory Visit
Admission: RE | Admit: 2016-08-23 | Discharge: 2016-08-23 | Disposition: A | Discharge status: Home / Self Care | Payer: Medicare HMO | Source: Ambulatory Visit | Attending: Family Medicine | Admitting: Family Medicine

## 2016-08-26 ENCOUNTER — Ambulatory Visit
Admission: RE | Admit: 2016-08-26 | Discharge: 2016-08-26 | Disposition: A | Discharge status: Home / Self Care | Payer: Medicare HMO | Source: Ambulatory Visit | Attending: Family Medicine | Admitting: Family Medicine

## 2016-08-26 DIAGNOSIS — N83202 Unspecified ovarian cyst, left side: Secondary | ICD-10-CM | POA: Insufficient documentation

## 2016-08-26 DIAGNOSIS — R634 Abnormal weight loss: Secondary | ICD-10-CM

## 2016-08-27 ENCOUNTER — Other Ambulatory Visit: Payer: Self-pay | Admitting: Family Medicine

## 2016-08-27 DIAGNOSIS — N83202 Unspecified ovarian cyst, left side: Secondary | ICD-10-CM

## 2016-08-30 ENCOUNTER — Ambulatory Visit
Admission: RE | Admit: 2016-08-30 | Discharge: 2016-08-30 | Disposition: A | Discharge status: Home / Self Care | Payer: Medicare HMO | Source: Ambulatory Visit | Attending: Family Medicine | Admitting: Family Medicine

## 2016-08-30 DIAGNOSIS — N83292 Other ovarian cyst, left side: Secondary | ICD-10-CM | POA: Diagnosis not present

## 2016-08-30 DIAGNOSIS — N83202 Unspecified ovarian cyst, left side: Secondary | ICD-10-CM | POA: Diagnosis present

## 2016-09-18 ENCOUNTER — Other Ambulatory Visit: Payer: Medicare HMO

## 2016-09-18 ENCOUNTER — Ambulatory Visit
Admission: RE | Admit: 2016-09-18 | Discharge: 2016-09-18 | Disposition: A | Discharge status: Home / Self Care | Payer: Medicare HMO | Source: Ambulatory Visit | Attending: Surgery | Admitting: Surgery

## 2016-09-18 ENCOUNTER — Ambulatory Visit: Payer: Medicare HMO

## 2016-09-18 DIAGNOSIS — Z853 Personal history of malignant neoplasm of breast: Secondary | ICD-10-CM | POA: Diagnosis present

## 2016-09-18 HISTORY — DX: Personal history of irradiation: Z92.3

## 2016-09-18 NOTE — Progress Notes (Signed)
Dr. Rogue Bussing, It looks like she has an appointment with Dr. Tamala Julian on 09/25/16.

## 2017-04-15 ENCOUNTER — Ambulatory Visit
Admission: RE | Admit: 2017-04-15 | Discharge: 2017-04-15 | Disposition: A | Discharge status: Home / Self Care | Payer: Medicare HMO | Source: Ambulatory Visit | Attending: Student | Admitting: Student

## 2017-04-15 ENCOUNTER — Other Ambulatory Visit: Payer: Self-pay | Admitting: Student

## 2017-04-15 DIAGNOSIS — N6313 Unspecified lump in the right breast, lower outer quadrant: Secondary | ICD-10-CM | POA: Insufficient documentation

## 2017-04-15 DIAGNOSIS — Z853 Personal history of malignant neoplasm of breast: Secondary | ICD-10-CM | POA: Insufficient documentation

## 2017-04-17 ENCOUNTER — Other Ambulatory Visit: Payer: Self-pay | Admitting: Student

## 2017-04-17 DIAGNOSIS — R928 Other abnormal and inconclusive findings on diagnostic imaging of breast: Secondary | ICD-10-CM

## 2017-04-17 DIAGNOSIS — N6310 Unspecified lump in the right breast, unspecified quadrant: Secondary | ICD-10-CM

## 2017-05-01 ENCOUNTER — Ambulatory Visit
Admission: RE | Admit: 2017-05-01 | Discharge: 2017-05-01 | Disposition: A | Discharge status: Home / Self Care | Payer: Medicare HMO | Source: Ambulatory Visit | Attending: Student | Admitting: Student

## 2017-05-01 DIAGNOSIS — N6313 Unspecified lump in the right breast, lower outer quadrant: Secondary | ICD-10-CM | POA: Diagnosis present

## 2017-05-01 DIAGNOSIS — N6310 Unspecified lump in the right breast, unspecified quadrant: Secondary | ICD-10-CM

## 2017-05-01 DIAGNOSIS — R928 Other abnormal and inconclusive findings on diagnostic imaging of breast: Secondary | ICD-10-CM

## 2017-05-02 LAB — SURGICAL PATHOLOGY

## 2017-06-27 ENCOUNTER — Emergency Department
Admission: EM | Admit: 2017-06-27 | Discharge: 2017-06-27 | Disposition: A | Discharge status: Home / Self Care | Payer: Medicare HMO | Attending: Emergency Medicine | Admitting: Emergency Medicine

## 2017-06-27 ENCOUNTER — Emergency Department: Payer: Medicare HMO

## 2017-06-27 DIAGNOSIS — S42202A Unspecified fracture of upper end of left humerus, initial encounter for closed fracture: Secondary | ICD-10-CM | POA: Insufficient documentation

## 2017-06-27 DIAGNOSIS — Y93E5 Activity, floor mopping and cleaning: Secondary | ICD-10-CM | POA: Insufficient documentation

## 2017-06-27 DIAGNOSIS — Z79899 Other long term (current) drug therapy: Secondary | ICD-10-CM | POA: Insufficient documentation

## 2017-06-27 DIAGNOSIS — Y999 Unspecified external cause status: Secondary | ICD-10-CM | POA: Diagnosis not present

## 2017-06-27 DIAGNOSIS — Y929 Unspecified place or not applicable: Secondary | ICD-10-CM | POA: Insufficient documentation

## 2017-06-27 DIAGNOSIS — W010XXA Fall on same level from slipping, tripping and stumbling without subsequent striking against object, initial encounter: Secondary | ICD-10-CM | POA: Diagnosis not present

## 2017-06-27 DIAGNOSIS — I10 Essential (primary) hypertension: Secondary | ICD-10-CM | POA: Insufficient documentation

## 2017-06-27 DIAGNOSIS — S4992XA Unspecified injury of left shoulder and upper arm, initial encounter: Secondary | ICD-10-CM | POA: Diagnosis present

## 2017-06-27 DIAGNOSIS — Z7984 Long term (current) use of oral hypoglycemic drugs: Secondary | ICD-10-CM | POA: Diagnosis not present

## 2017-06-27 DIAGNOSIS — E119 Type 2 diabetes mellitus without complications: Secondary | ICD-10-CM | POA: Diagnosis not present

## 2017-06-27 MED ORDER — IBUPROFEN 600 MG PO TABS
600.0000 mg | ORAL_TABLET | Freq: Once | ORAL | Status: AC
  Administered 2017-06-27: 600 mg via ORAL
  Filled 2017-06-27: qty 1

## 2017-06-27 MED ORDER — HYDROCODONE-ACETAMINOPHEN 5-325 MG PO TABS
1.0000 | ORAL_TABLET | Freq: Once | ORAL | Status: AC
  Administered 2017-06-27: 1 via ORAL
  Filled 2017-06-27: qty 1

## 2017-06-27 MED ORDER — HYDROCODONE-ACETAMINOPHEN 5-325 MG PO TABS: 1.0000 | ORAL_TABLET | Freq: Four times a day (QID) | ORAL | 0 refills | Status: DC | PRN

## 2017-06-27 NOTE — ED Triage Notes (Signed)
Pt presents vis acems for a fall. Pt was cleaning house, and was in a hurry r/t a visitor coming. t turned and sliped on the rubber mats that keep your rugs still. Pt fell on Left side. Pt denies hitting head or LOC. Pt is a/o.

## 2017-06-27 NOTE — ED Notes (Signed)
Pt has immobilizer on Left shoulder. Pt tolerated well.

## 2017-06-27 NOTE — Discharge Instructions (Signed)
Please wear your sling at all times until you are able to follow-up with the orthopedic surgeon this coming week for recheck.  Return to the emergency department for any concerns whatsoever.  It was a pleasure to take care of you today, and thank you for coming to our emergency department.  If you have any questions or concerns before leaving please ask the nurse to grab me and I'm more than happy to go through your aftercare instructions again.  If you were prescribed any opioid pain medication today such as Norco, Vicodin, Percocet, morphine, hydrocodone, or oxycodone please make sure you do not drive when you are taking this medication as it can alter your ability to drive safely.  If you have any concerns once you are home that you are not improving or are in fact getting worse before you can make it to your follow-up appointment, please do not hesitate to call 911 and come back for further evaluation.  Darel Hong, MD  Results for orders placed or performed during the hospital encounter of 05/01/17  Surgical pathology  Result Value Ref Range   SURGICAL PATHOLOGY      Surgical Pathology CASE: [id number] PATIENT: Brynlei Kopplin Surgical Pathology Report     SPECIMEN SUBMITTED: A. Breast, right, 8:00  CLINICAL HISTORY: Palpable right breast mass  PRE-OPERATIVE DIAGNOSIS: Lipoma  POST-OPERATIVE DIAGNOSIS: None provided.     DIAGNOSIS: A. BREAST, RIGHT 8:00; ULTRASOUND GUIDED BIOPSY: - MATURE ADIPOSE TISSUE COMPATIBLE WITH CLINICAL IMPRESSION OF LIPOMA. - NEGATIVE FOR MALIGNANCY.   GROSS DESCRIPTION:  A. The specimen is received in a formalin-filled container labeled with the patient's name and right breast 8:00, 7 cm from nipple.  Core pieces: 3 Measurement: 1.1-1.3 cm in length and 0.2 cm in diameter Comments: yellow lobulated fibrofatty, marked green  Entirely submitted in cassette(s): 1  Time/Date in fixative: collected and placed in formalin at 11:10 AM  on 05/01/2017 Total fixation time: 8.75 hours   Final Diagnosis performed by Quay Burow, MD.  Electronically signed 05/02/2017 12:54:54PM    The el ectronic signature indicates that the named Attending Pathologist has evaluated the specimen  Technical component performed at [facility], [address] [place], [place] [address] Lab: [phone] Dir: Darrick Penna. Evette Doffing, MD  Professional component performed at [facility], [address], [place], [place], [place] [address] Lab: [phone] Dir: Dellia Nims. Reuel Derby, MD     Dg Humerus Left  Result Date: 06/27/2017 CLINICAL DATA:  Left shoulder pain after fall. EXAM: LEFT HUMERUS - 2+ VIEW COMPARISON:  CT scan of March 05, 2015. FINDINGS: Mildly displaced fracture is seen involving the proximal left humeral head and neck. Mild inferior subluxation of the left humeral head is noted as well. Remaining portion of left humerus appears normal. IMPRESSION: Mildly displaced proximal left humeral head and neck fracture. Electronically Signed   By: Marijo Conception, M.D.   On: 06/27/2017 16:48

## 2017-06-27 NOTE — ED Notes (Signed)
Per EDP, ok with pt not having a troponin with EKG at this time.

## 2017-06-27 NOTE — ED Provider Notes (Signed)
[facility] Emergency Department Provider Note  ____________________________________________   First MD Initiated Contact with Patient 06/27/17 1625     (approximate)  I have reviewed the triage vital signs and the nursing notes.   HISTORY  Chief Complaint Fall   HPI Paige Dougherty is a 83 y.o. female who comes to the emergency department via EMS with sudden fell onto her left side.  The pain is nonradiating.  It is distal to her shoulder and the proximal humerus.  It is worse with movement improved with rest.  She did not hit her head.  She denies double vision blurred vision chest pain shortness of breath abdominal pain nausea vomiting.  She did not syncopized.  She clearly remembers the trip and fall.  She splinted her arm with a towel which seems to have helped her symptoms.  She received no pain medication in route.  Past Medical History:  Diagnosis Date  . Allergy   . Anxiety   . Arthritis    HANDS/FEET  . Barrett's esophagus   . Breast cancer ([place])   . Cancer of breast ([place]) 12/24/2014   radiation- Right  . Chronic headache   . Depression   . Diabetes mellitus   . Diverticulosis   . Esophageal stricture   . Fibrocystic breast disease   . GERD (gastroesophageal reflux disease)   . Hyperlipemia   . Hypertension   . IBS (irritable bowel syndrome)   . Personal history of radiation therapy   . Urinary incontinence     Patient Active Problem List   Diagnosis Date Noted  . Breast cancer of upper-inner quadrant of right female breast ([place]) 01/22/2016  . Chronic gouty arthritis 03/14/2015  . Endocrinal hypertension 03/14/2015  . Fast heart beat 03/14/2015  . Electrolyte imbalance 03/14/2015  . H/O malignant neoplasm of breast 03/14/2015  . Malignant neoplastic disease ([place]) 03/11/2015  . Adaptive colitis 03/11/2015  . Type 2 diabetes mellitus ([place]) 03/11/2015  . Major depressive disorder, single episode, severe without psychotic features  ([place])   . Depression, major, single episode, severe ([place]) 03/08/2015  . Overdose of benzodiazepine 03/05/2015  . Overdose of antidepressant 03/05/2015  . Overdose of opiate or related narcotic ([place]) 03/05/2015  . Suicide attempt ([place]) 03/05/2015  . Acute respiratory failure with hypoxemia ([place]) 03/05/2015  . Cancer of breast ([place]) 12/24/2014  . Urinary incontinence   . Fibrocystic breast disease   . Chronic headache   . Benign essential HTN 02/22/2014  . Diabetes mellitus ([place]) 02/22/2014  . Anxiety 04/11/2009  . Depression 04/11/2009  . ESOPHAGEAL STRICTURE 04/11/2009  . GERD 04/11/2009  . DIVERTICULOSIS, COLON 04/11/2009  . IRRITABLE BOWEL SYNDROME 04/11/2009  . ABDOMINAL PAIN, UNSPECIFIED SITE 04/11/2009    Past Surgical History:  Procedure Laterality Date  . ABDOMINAL HYSTERECTOMY    . BREAST BIOPSY Bilateral    core bxs  . BREAST EXCISIONAL BIOPSY Right 2016   +  . CARPAL TUNNEL RELEASE     bilateral  . CATARACT EXTRACTION     bilateral   . COLONOSCOPY    . DILATION AND CURETTAGE OF UTERUS    . finger cyst removal    . neck cyst removal    . UPPER GASTROINTESTINAL ENDOSCOPY      Prior to Admission medications   Medication Sig Start Date End Date Taking? Authorizing Provider  citalopram (CELEXA) 20 MG tablet Take 1 tablet by mouth daily. 01/02/17 01/02/18 Yes [provider]  calcium-vitamin D (Darron Doom WITH  D) 500-200 MG-UNIT per tablet Take 1 tablet by mouth 2 (two) times daily. 12/26/14   Forest Gleason, MD  enalapril (VASOTEC) 10 MG tablet Take 10 mg by mouth 2 (two) times daily.    [provider]  escitalopram (LEXAPRO) 10 MG tablet  05/26/15   [provider]  hydrochlorothiazide (HYDRODIURIL) 25 MG tablet Take 25 mg by mouth daily.    [provider]  HYDROcodone-acetaminophen (NORCO) 5-325 MG tablet Take 1 tablet by mouth every 6 (six) hours as needed for up to 15 doses for severe pain. 06/27/17   Darel Hong, MD    indomethacin (INDOCIN) 25 MG capsule Take 25 mg by mouth 3 (three) times daily as needed.    [provider]  LORazepam (ATIVAN) 0.5 MG tablet Take 1 tablet (0.5 mg total) by mouth 2 (two) times daily. 07/03/15   Forest Gleason, MD  metFORMIN (GLUCOPHAGE) 500 MG tablet Take 500 mg by mouth daily with breakfast.    [provider]  metoprolol succinate (TOPROL-XL) 25 MG 24 hr tablet Take 1 tablet (25 mg total) by mouth daily. 07/04/15   Forest Gleason, MD  mirtazapine (REMERON SOL-TAB) 15 MG disintegrating tablet Take 1 tablet (15 mg total) by mouth at bedtime. 03/10/15   Dustin Flock, MD  omeprazole (PRILOSEC) 20 MG capsule Take 1 capsule (20 mg total) by mouth daily. 07/04/15   Forest Gleason, MD  Red Yeast Rice 600 MG CAPS Take by mouth.    [provider]  vitamin B-12 (CYANOCOBALAMIN) 1000 MCG tablet Take 1 tablet (1,000 mcg total) by mouth daily. 07/04/15   Forest Gleason, MD    Allergies Avapro [irbesartan]; Azithromycin; Clindamycin; Duloxetine; Duloxetine hcl; Ezetimibe-simvastatin; Lipitor [atorvastatin]; Metronidazole; Penicillins; Prednisone; Procaine; Procaine hcl; Eggs or egg-derived products; Other; Statins; Sulfa antibiotics; and Tetracycline  Family History  Problem Relation Age of Onset  . Colon cancer Father   . Thyroid disease Father   . Thyroid disease Mother   . Hypertension Mother   . Colon cancer Maternal Grandmother   . Breast cancer Maternal Grandmother     Social History Social History   Tobacco Use  . Smoking status: Never Smoker  . Smokeless tobacco: Never Used  Substance Use Topics  . Alcohol use: Yes    Comment: occasional  . Drug use: No    Review of Systems Constitutional: No fever/chills Eyes: No visual changes. ENT: No sore throat. Cardiovascular: Denies chest pain. Respiratory: Denies shortness of breath. Gastrointestinal: No abdominal pain.  No nausea, no vomiting.  No diarrhea.  No constipation. Genitourinary:  Negative for dysuria. Musculoskeletal: Negative for back pain. Skin: Negative for rash. Neurological: Negative for headaches, focal weakness or numbness.   ____________________________________________   PHYSICAL EXAM:  VITAL SIGNS: ED Triage Vitals  Enc Vitals Group     BP 06/27/17 1624 (!) 171/78     Pulse Rate 06/27/17 1624 (!) 57     Resp 06/27/17 1624 19     Temp --      Temp src --      SpO2 --      Weight 06/27/17 1620 148 lb (67.1 kg)     Height 06/27/17 1620 5\' 2"  (1.575 m)     Head Circumference --      Peak Flow --      Pain Score 06/27/17 1618 1     Pain Loc --      Pain Edu? --      Excl. in [place]? --  Constitutional: Alert and oriented x4 pleasant cooperative speaks in full clear sentences no diaphoresis Eyes: PERRL EOMI. Head: Atraumatic. Nose: No congestion/rhinnorhea. Mouth/Throat: No trismus Neck: No stridor.   Cardiovascular: Normal rate, regular rhythm. Grossly normal heart sounds.  Good peripheral circulation. Respiratory: Normal respiratory effort.  No retractions. Lungs CTAB and moving good air Gastrointestinal: Soft nontender Musculoskeletal: No clavicular tenderness shoulder is not dislocated she is tender with ecchymosis over the proximal humerus.  Exquisite discomfort with external rotation of the left shoulder.  She is neurovascularly intact Neurologic:  Normal speech and language. No gross focal neurologic deficits are appreciated. Skin:  Skin is warm, dry and intact. No rash noted. Psychiatric: Mood and affect are normal. Speech and behavior are normal.    ____________________________________________   DIFFERENTIAL includes but not limited to  Shoulder dislocation, clavicular fracture, humeral fracture, mechanical fall, syncope ____________________________________________   LABS (all labs ordered are listed, but only abnormal results are displayed)  Labs Reviewed - No data to  display   __________________________________________  EKG   ____________________________________________  RADIOLOGY  X-ray reviewed by me shows nondisplaced fracture of the left humeral neck ____________________________________________   PROCEDURES  Procedure(s) performed: no  Procedures  Critical Care performed: no  Observation: no ____________________________________________   INITIAL IMPRESSION / ASSESSMENT AND PLAN / ED COURSE  Pertinent labs & imaging results that were available during my care of the patient were reviewed by me and considered in my medical decision making (see chart for details).  The patient arrives with obvious discomfort to the left lateral aspect of her humerus after a clear mechanical fall.  She did not strike her head.  She is neurovascularly intact.  X-ray confirms a nondisplaced fracture to the left humeral head.  She is placed in a sling with improvement in her symptoms.  I will refer her to orthopedic surgery as an outpatient.  Strict return precautions and given and the patient and her husband verbalized understanding and agreement the plan.      ____________________________________________   FINAL CLINICAL IMPRESSION(S) / ED DIAGNOSES  Final diagnoses:  Closed fracture of proximal end of left humerus, unspecified fracture morphology, initial encounter      NEW MEDICATIONS STARTED DURING THIS VISIT:  This SmartLink is deprecated. Use AVSMEDLIST instead to display the medication list for a patient.   Note:  This document was prepared using Dragon voice recognition software and may include unintentional dictation errors.     Darel Hong, MD 06/28/17 1435

## 2017-07-31 ENCOUNTER — Ambulatory Visit: Payer: Medicare HMO | Admitting: Radiation Oncology

## 2017-09-22 ENCOUNTER — Encounter: Payer: Self-pay | Admitting: *Deleted

## 2017-09-25 ENCOUNTER — Other Ambulatory Visit: Payer: Self-pay | Admitting: Family Medicine

## 2017-09-25 DIAGNOSIS — Z1231 Encounter for screening mammogram for malignant neoplasm of breast: Secondary | ICD-10-CM

## 2017-10-17 ENCOUNTER — Ambulatory Visit
Admission: RE | Admit: 2017-10-17 | Discharge: 2017-10-17 | Disposition: A | Discharge status: Home / Self Care | Payer: Medicare HMO | Source: Ambulatory Visit | Attending: Family Medicine | Admitting: Family Medicine

## 2017-10-17 ENCOUNTER — Other Ambulatory Visit: Payer: Self-pay | Admitting: Family Medicine

## 2017-10-17 DIAGNOSIS — Z9889 Other specified postprocedural states: Secondary | ICD-10-CM | POA: Insufficient documentation

## 2017-10-17 DIAGNOSIS — Z08 Encounter for follow-up examination after completed treatment for malignant neoplasm: Secondary | ICD-10-CM | POA: Insufficient documentation

## 2017-10-17 DIAGNOSIS — Z1231 Encounter for screening mammogram for malignant neoplasm of breast: Secondary | ICD-10-CM

## 2017-10-17 DIAGNOSIS — Z853 Personal history of malignant neoplasm of breast: Secondary | ICD-10-CM

## 2017-11-12 ENCOUNTER — Ambulatory Visit: Payer: Medicare HMO | Admitting: Radiation Oncology

## 2017-11-27 ENCOUNTER — Ambulatory Visit: Payer: Medicare HMO | Admitting: Radiation Oncology

## 2017-12-03 ENCOUNTER — Ambulatory Visit
Admission: RE | Admit: 2017-12-03 | Discharge: 2017-12-03 | Disposition: A | Discharge status: Home / Self Care | Payer: Medicare HMO | Source: Ambulatory Visit | Attending: Radiation Oncology | Admitting: Radiation Oncology

## 2017-12-03 ENCOUNTER — Encounter: Payer: Self-pay | Admitting: Radiation Oncology

## 2017-12-03 ENCOUNTER — Other Ambulatory Visit: Payer: Self-pay

## 2017-12-03 VITALS — BP 170/82 | HR 82 | Temp 97.2°F | Resp 20 | Wt 182.0 lb

## 2017-12-03 DIAGNOSIS — Z853 Personal history of malignant neoplasm of breast: Secondary | ICD-10-CM | POA: Diagnosis present

## 2017-12-03 DIAGNOSIS — Z170 Estrogen receptor positive status [ER+]: Secondary | ICD-10-CM | POA: Insufficient documentation

## 2017-12-03 DIAGNOSIS — Z923 Personal history of irradiation: Secondary | ICD-10-CM | POA: Insufficient documentation

## 2017-12-03 DIAGNOSIS — Z8781 Personal history of (healed) traumatic fracture: Secondary | ICD-10-CM | POA: Insufficient documentation

## 2017-12-03 DIAGNOSIS — C50411 Malignant neoplasm of upper-outer quadrant of right female breast: Secondary | ICD-10-CM

## 2017-12-03 NOTE — Progress Notes (Signed)
Radiation Oncology Follow up Note  Name: Paige Dougherty   Date:   12/03/2017 MRN:  [id number] DOB: 11/29/1932    This 84 y.o. female presents to the clinic today for 3 year follow-up status post whole breast radiation to her right breast for stage I ER/PR positive invasive mammary carcinoma.  REFERRING PROVIDER: Babaoff, Marcus, MD  HPI: patient is a 84-year-old female now seen out 3 years having completed whole breast radiation to her right breast for stage I ER/PR positive HER-2/neu negative invasive mammary carcinoma. Seen today in routine follow-up she is doing well. She specifically denies breast tenderness cough or bone pain. She has decided she is not taking antiestrogen therapy and is not on that therapy. She did have a traumatic fall breaking her left humerus which is healed at this time well.mammograms back in March were BI-RADS 2 benign which I have reviewed. COMPLICATIONS OF TREATMENT: none  FOLLOW UP COMPLIANCE: keeps appointments   PHYSICAL EXAM:  BP (!) 170/82   Pulse 82   Temp (!) 97.2 F (36.2 C)   Resp 20   Wt 181 lb 15.8 oz (82.6 kg)   BMI 33.29 kg/m  Lungs are clear to A&P cardiac examination essentially unremarkable with regular rate and rhythm. No dominant mass or nodularity is noted in either breast in 2 positions examined. Incision is well-healed. No axillary or supraclavicular adenopathy is appreciated. Cosmetic result is excellent. Well-developed well-nourished patient in NAD. HEENT reveals PERLA, EOMI, discs not visualized.  Oral cavity is clear. No oral mucosal lesions are identified. Neck is clear without evidence of cervical or supraclavicular adenopathy. Lungs are clear to A&P. Cardiac examination is essentially unremarkable with regular rate and rhythm without murmur rub or thrill. Abdomen is benign with no organomegaly or masses noted. Motor sensory and DTR levels are equal and symmetric in the upper and lower extremities. Cranial nerves II through XII are  grossly intact. Proprioception is intact. No peripheral adenopathy or edema is identified. No motor or sensory levels are noted. Crude visual fields are within normal range.  RADIOLOGY RESULTS: mammograms reviewed and compatible with the above-stated findings  PLAN: present time patient is doing well with no evidence of disease. I will see her one more time in a year and then discontinue follow-up care. She is a rescheduled follow-up mammograms. Patient knows to call with any concerns.  I would like to take this opportunity to thank you for allowing me to participate in the care of your patient..     , MD   

## 2018-05-04 ENCOUNTER — Other Ambulatory Visit: Payer: Self-pay | Admitting: Family Medicine

## 2018-05-04 DIAGNOSIS — N6310 Unspecified lump in the right breast, unspecified quadrant: Secondary | ICD-10-CM

## 2018-05-08 ENCOUNTER — Ambulatory Visit
Admission: RE | Admit: 2018-05-08 | Discharge: 2018-05-08 | Disposition: A | Discharge status: Home / Self Care | Payer: Medicare HMO | Source: Ambulatory Visit | Attending: Family Medicine | Admitting: Family Medicine

## 2018-05-08 DIAGNOSIS — N6310 Unspecified lump in the right breast, unspecified quadrant: Secondary | ICD-10-CM

## 2018-09-15 ENCOUNTER — Other Ambulatory Visit: Payer: Self-pay | Admitting: Physician Assistant

## 2018-09-15 DIAGNOSIS — G8929 Other chronic pain: Secondary | ICD-10-CM

## 2018-09-15 DIAGNOSIS — M25562 Pain in left knee: Principal | ICD-10-CM

## 2018-09-15 DIAGNOSIS — M25561 Pain in right knee: Secondary | ICD-10-CM

## 2018-09-16 ENCOUNTER — Other Ambulatory Visit: Payer: Self-pay | Admitting: Family Medicine

## 2018-09-25 ENCOUNTER — Ambulatory Visit
Admission: RE | Admit: 2018-09-25 | Discharge: 2018-09-25 | Disposition: A | Discharge status: Home / Self Care | Payer: Medicare HMO | Source: Ambulatory Visit | Attending: Physician Assistant | Admitting: Physician Assistant

## 2018-09-25 DIAGNOSIS — G8929 Other chronic pain: Secondary | ICD-10-CM | POA: Insufficient documentation

## 2018-09-25 DIAGNOSIS — M25561 Pain in right knee: Secondary | ICD-10-CM | POA: Diagnosis present

## 2018-09-25 DIAGNOSIS — M25562 Pain in left knee: Secondary | ICD-10-CM | POA: Insufficient documentation

## 2018-12-04 ENCOUNTER — Other Ambulatory Visit: Payer: Self-pay | Admitting: Family Medicine

## 2018-12-04 DIAGNOSIS — Z853 Personal history of malignant neoplasm of breast: Secondary | ICD-10-CM

## 2018-12-09 ENCOUNTER — Ambulatory Visit: Payer: Medicare HMO | Admitting: Radiation Oncology

## 2019-01-06 ENCOUNTER — Ambulatory Visit (INDEPENDENT_AMBULATORY_CARE_PROVIDER_SITE_OTHER): Payer: Medicare HMO | Admitting: Psychiatry

## 2019-01-06 ENCOUNTER — Other Ambulatory Visit: Payer: Self-pay

## 2019-01-06 ENCOUNTER — Encounter: Payer: Self-pay | Admitting: Psychiatry

## 2019-01-06 DIAGNOSIS — F411 Generalized anxiety disorder: Secondary | ICD-10-CM | POA: Diagnosis not present

## 2019-01-06 DIAGNOSIS — F5105 Insomnia due to other mental disorder: Secondary | ICD-10-CM

## 2019-01-06 DIAGNOSIS — F410 Panic disorder [episodic paroxysmal anxiety] without agoraphobia: Secondary | ICD-10-CM

## 2019-01-06 DIAGNOSIS — F331 Major depressive disorder, recurrent, moderate: Secondary | ICD-10-CM

## 2019-01-06 MED ORDER — QUETIAPINE FUMARATE 25 MG PO TABS: 25.0000 mg | ORAL_TABLET | Freq: Every day | ORAL | 0 refills | Status: DC

## 2019-01-06 NOTE — Progress Notes (Signed)
Virtual Visit via Video Note  I connected with Paige Dougherty on 01/06/19 at 11:00 AM EDT by a video enabled telemedicine application and verified that I am speaking with the correct person using two identifiers.   I discussed the limitations of evaluation and management by telemedicine and the availability of in person appointments. The patient expressed understanding and agreed to proceed.   I discussed the assessment and treatment plan with the patient. The patient was provided an opportunity to ask questions and all were answered. The patient agreed with the plan and demonstrated an understanding of the instructions.   The patient was advised to call back or seek an in-person evaluation if the symptoms worsen or if the condition fails to improve as anticipated.    Psychiatric Initial Adult Assessment   Patient Identification: Paige Dougherty MRN:  [id number] Date of Evaluation:  01/06/2019 Referral Source: [facility] Chief Complaint:   Chief Complaint    Establish Care     Visit Diagnosis:    ICD-10-CM   1. MDD (major depressive disorder), recurrent episode, moderate (HCC) F33.1 QUEtiapine (SEROQUEL) 25 MG tablet  2. GAD (generalized anxiety disorder) F41.1 QUEtiapine (SEROQUEL) 25 MG tablet  3. Panic attacks F41.0 QUEtiapine (SEROQUEL) 25 MG tablet  4. Insomnia due to mental disorder F51.05 QUEtiapine (SEROQUEL) 25 MG tablet    History of Present Illness:  Paige Dougherty is an 85 year old Caucasian female, married, lives with her husband in [place], has a history of depression, sleep problems, hypertension, IBS, diabetes melitis, history of breast cancer was evaluated by telemedicine today.  Patient's daughter Paige Dougherty at [phone] who is the healthcare power of attorney also provided collateral information during the session.  Majority of the information was obtained from her daughter.  Patient appeared to be a limited historian.  Per daughter patient has been struggling with depression  since the past several years.  She first was diagnosed with depression postpartum after the birth of the daughter.  She has been tried on several different medications previously.  In 2016 she had a suicide attempt and was admitted to geriatric facility in [place].  Patient has tried medications Dougherty Lexapro, mirtazapine, Celexa previously.  She was doing well on her medications which were prescribed by her primary medical doctor until 2 to 3 months ago.  In April her husband had a stroke and was unable to drive and take care of a lot of activities around the house.  Ever since then patient has been decompensating.  Patient appears to be extremely anxious, shaky at times.  She also appears to have difficulty with managing certain things around the home.  She is afraid to drive since she is the only one who has to drive now and her husband is not able to do so.  Just the thought of driving also has been making her very anxious.  Patient has been having crying spells, sadness, inability to focus as well as racing thoughts, getting worse since the past few weeks.  Patient also has been having panic attacks when she has racing heart rate and she is extremely nervous and anxious.  Her daughter has been making use of essential oils to calm her during her panic symptoms which helps to some extent.  Her primary medical doctor added BuSpar recently and she took her first dosage last night.  However she did not tolerate it well and had extreme sleep problems last night.  Patient struggles with his sleep.  She has difficulty getting to sleep  however once she is asleep she is able to maintain sleep.  She has been using melatonin at bedtime for her sleep which helps to some extent.  Her sleep problems are also affected by her racing thoughts at night.  Patient denies any perceptual disturbances.  Patient denies any suicidality or homicidality at this time.  Patient appeared to be alert, oriented to person place and  situation.  She however had difficulty following questions that were asked and needed her daughter to clarify it for her and repeated.  Patient was able to answer questions appropriately.  Patient appeared to be anxious and restless during the session.  She however was able to recall personal information including her phone number, address as well as her memory seemed to be fine for 3 word, immediate and after 5 minutes.  She was able to  draw a clock well with the right time.  Per daughter patient is currently on Celexa 20 mg, lorazepam 0.5 mg twice a day and mirtazapine 15 mg at bedtime.  Patient has been taking lorazepam since the past 3 to 4 years at least.  The lorazepam was helpful however most recently it does not seem to help much.  Daughter reports she was able to get healthcare power of attorney yesterday and currently has been helping her out on a regular basis.  She has been staying with her mother and father all day and working from their home to help them.    Associated Signs/Symptoms: Depression Symptoms:  depressed mood, insomnia, psychomotor agitation, feelings of worthlessness/guilt, difficulty concentrating, anxiety, disturbed sleep, (Hypo) Manic Symptoms:  denies Anxiety Symptoms:  Excessive Worry, Panic Symptoms, Psychotic Symptoms:  denies PTSD Symptoms: Negative  Past Psychiatric History: Past history of depression, panic attacks.  Patient had one inpatient mental health admission in 2016 at [facility] for suicide attempt.  Patient has been under the care of her primary medical doctor who has been managing her medications.  She also saw a geriatric psychiatric once in [place] however does not remember the name.   Previous Psychotropic Medications: Yes Lexapro, Celexa, mirtazapine, lorazepam, BuSpar  Substance Abuse History in the last 12 months:  No.  Consequences of Substance Abuse: Negative  Past Medical History:  Past Medical History:  Diagnosis Date   . Allergy   . Anxiety   . Arthritis    HANDS/FEET  . Barrett's esophagus   . Breast cancer ([place]) 2016   RIGHT lumpectomy 2016 INVASIVE MAMMARY CARCINOMA   . Cancer of breast ([place]) 12/24/2014   radiation- Right  . Chronic headache   . Depression   . Diabetes mellitus   . Diverticulosis   . Esophageal stricture   . Fibrocystic breast disease   . GERD (gastroesophageal reflux disease)   . Hyperlipemia   . Hypertension   . IBS (irritable bowel syndrome)   . Personal history of radiation therapy 2016   RIGHT lumpectomy    INVASIVE MAMMARY CARCINOMA   . Urinary incontinence     Past Surgical History:  Procedure Laterality Date  . ABDOMINAL HYSTERECTOMY    . BREAST BIOPSY Bilateral    core bxs  . BREAST EXCISIONAL BIOPSY Right 2016   +  . BREAST LUMPECTOMY Right 2016   INVASIVE MAMMARY CARCINOMA   . CARPAL TUNNEL RELEASE     bilateral  . CATARACT EXTRACTION     bilateral   . COLONOSCOPY    . DILATION AND CURETTAGE OF UTERUS    . finger cyst removal    .  neck cyst removal    . UPPER GASTROINTESTINAL ENDOSCOPY      Family Psychiatric History: Mother-dementia, daughter-anxiety.  Family History:  Family History  Problem Relation Age of Onset  . Colon cancer Father   . Thyroid disease Father   . Thyroid disease Mother   . Hypertension Mother   . Alzheimer's disease Mother   . Colon cancer Maternal Grandmother   . Breast cancer Maternal Grandmother   . Anxiety disorder Daughter     Social History:   Social History   Socioeconomic History  . Marital status: Married    Spouse name: Not on file  . Number of children: Not on file  . Years of education: Not on file  . Highest education level: Not on file  Occupational History  . Not on file  Social Needs  . Financial resource strain: Not on file  . Food insecurity:    Worry: Not on file    Inability: Not on file  . Transportation needs:    Medical: Not on file    Non-medical: Not on file  Tobacco Use  .  Smoking status: Never Smoker  . Smokeless tobacco: Never Used  Substance and Sexual Activity  . Alcohol use: Yes    Comment: occasional  . Drug use: No  . Sexual activity: Not on file  Lifestyle  . Physical activity:    Days per week: Not on file    Minutes per session: Not on file  . Stress: Not on file  Relationships  . Social connections:    Talks on phone: Not on file    Gets together: Not on file    Attends religious service: Not on file    Active member of club or organization: Not on file    Attends meetings of clubs or organizations: Not on file    Relationship status: Not on file  Other Topics Concern  . Not on file  Social History Narrative  . Not on file    Additional Social History: Patient is married.  She lives with her husband in [place].  She has 1 daughter-Paige Dougherty.  Patient also has 1 grandson.  She has good support system from her daughter.  Allergies:   Allergies  Allergen Reactions  . Avapro [Irbesartan] Other (See Comments)    Other reaction(s): Unknown unknown  . Azithromycin Other (See Comments)    Extreme vaginal burning. unknown  . Clindamycin Other (See Comments)    Vaginal burning  . Duloxetine Other (See Comments)    Hyperactivity.  . Duloxetine Hcl Other (See Comments)    Hyperactivity.  . Ezetimibe-Simvastatin Other (See Comments)    Arthralgias.  . Lipitor [Atorvastatin] Other (See Comments)    "muscle aches"  . Metronidazole Other (See Comments)  . Penicillins   . Prednisone Other (See Comments)    Dizziness/double vision  . Procaine Other (See Comments)    tremors  . Procaine Hcl     tremors  . Eggs Or Egg-Derived Products Other (See Comments)    Other Reaction: Not Assessed  . Other Other (See Comments) and Anxiety    Novacaine weakness  . Statins Rash    Arthralgias.  . Sulfa Antibiotics Rash    Vague history of a sulfa allergy, but does not remember the reaction.  . Tetracycline Rash    Metabolic Disorder  Labs: No results found for: HGBA1C, MPG No results found for: PROLACTIN No results found for: CHOL, TRIG, HDL, CHOLHDL, VLDL, LDLCALC No results found for:  TSH  Therapeutic Level Labs: No results found for: LITHIUM No results found for: CBMZ No results found for: VALPROATE  Current Medications: Current Outpatient Medications  Medication Sig Dispense Refill  . citalopram (CELEXA) 20 MG tablet Take by mouth.    . calcium-vitamin D (OSCAL WITH D) 500-200 MG-UNIT per tablet Take 1 tablet by mouth 2 (two) times daily. 60 tablet 6  . enalapril (VASOTEC) 10 MG tablet Take 10 mg by mouth 2 (two) times daily.    . hydrochlorothiazide (HYDRODIURIL) 25 MG tablet Take 25 mg by mouth daily.    Marland Kitchen HYDROcodone-acetaminophen (NORCO) 5-325 MG tablet Take 1 tablet by mouth every 6 (six) hours as needed for up to 15 doses for severe pain. (Patient not taking: Reported on 12/03/2017) 15 tablet 0  . indomethacin (INDOCIN) 25 MG capsule Take 25 mg by mouth 3 (three) times daily as needed.    Marland Kitchen LORazepam (ATIVAN) 0.5 MG tablet Take 1 tablet (0.5 mg total) by mouth 2 (two) times daily. 30 tablet   . metFORMIN (GLUCOPHAGE) 500 MG tablet Take 500 mg by mouth daily with breakfast.    . metoprolol succinate (TOPROL-XL) 25 MG 24 hr tablet Take 1 tablet (25 mg total) by mouth daily.    . mirtazapine (REMERON SOL-TAB) 15 MG disintegrating tablet Take 1 tablet (15 mg total) by mouth at bedtime. 1 tablet 0  . omeprazole (PRILOSEC) 20 MG capsule Take 1 capsule (20 mg total) by mouth daily.    . QUEtiapine (SEROQUEL) 25 MG tablet Take 1 tablet (25 mg total) by mouth at bedtime. 30 tablet 0  . Red Yeast Rice 600 MG CAPS Take by mouth.    . vitamin B-12 (CYANOCOBALAMIN) 1000 MCG tablet Take 1 tablet (1,000 mcg total) by mouth daily.     No current facility-administered medications for this visit.     Musculoskeletal: Strength & Muscle Tone: within normal limits Gait & Station: normal Patient leans: N/A  Psychiatric  Specialty Exam: Review of Systems  Psychiatric/Behavioral: Positive for depression. The patient is nervous/anxious and has insomnia.   All other systems reviewed and are negative.   There were no vitals taken for this visit.There is no height or weight on file to calculate BMI.  General Appearance: Casual  Eye Contact:  Fair  Speech:  Clear and Coherent  Volume:  Normal  Mood:  Anxious and Depressed  Affect:  Appropriate  Thought Process:  Goal Directed and Descriptions of Associations: Intact  Orientation:  Full (Time, Place, and Person)  Thought Content:  Logical  Suicidal Thoughts:  No  Homicidal Thoughts:  No  Memory:  Immediate;   Fair Recent;   Fair Remote;   Fair  Judgement:  Fair  Insight:  Fair  Psychomotor Activity:  Normal  Concentration:  Concentration: Fair and Attention Span: Fair  Recall:  [employer]: Fair  Akathisia:  No  Handed:  Right  AIMS (if indicated):  Denies rigidity,stiffness , however reports being shaky when she is nervous  Assets:  Communication Skills Desire for Improvement Housing Social Support  ADL's:  Intact  Cognition: WNL  Sleep:  restless   Screenings: PHQ2-9     Follow Up  from 12/03/2017 in [place] Follow Up  from 07/01/2016 in [place] Follow Up  from 07/03/2015 in [place]  PHQ-2 Total Score  0  0  0      Assessment  and Plan: Suhaylah is an 85 year old Caucasian female, married, lives in [place], has a history of depression, panic attacks hypertension, IBS, diabetes, history of breast cancer evaluated by telemedicine today.  Patient is biologically predisposed given her family history of mental health problems as well as her own health issues.  Patient has psychosocial stressors of recent health problems of her husband, the current pandemic.  Patient currently denies any suicidality  however does have a history of suicide attempt.  Patient with cognitive changes as well as worsening anxiety symptoms, currently on benzodiazepines will benefit from a reduction of benzodiazepine dosage and gradual tapering off.  Patient will also benefit from other medication changes as well as psychotherapy sessions.  Plan as noted below.  Plan MDD-unstable Continue Celexa 20 mg p.o. daily Mirtazapine 15 mg p.o. nightly Add Seroquel 25 mg p.o. daily at bedtime.  For GAD-unstable Celexa as prescribed Stop BuSpar due to side effects Seroquel 25 mg p.o. daily at bedtime will also help. Discussed with patient as well as daughter to spread out her lorazepam throughout the day for now. Discussed to take 0.25 mg in the morning, 0.25 mg at 12 noon and to take 0.5 mg at around 5 PM.  Discussed the long-term risk of being on benzodiazepine therapy and discussed with patient as well as daughter that the long-term goal is to wean off.   For panic attacks-unstable Lorazepam as discussed. Will refer for CBT with therapist here in clinic.  For insomnia- unstable Seroquel 25 mg p.o. nightly Discussed to reduce the dosage of melatonin now that she is on Seroquel. We will continue mirtazapine for now will not make any changes. Provided medication education including the risk of falls.  I have obtained collateral information from patient's daughter Paige Dougherty at [phone] as discussed above.  I have reviewed medical records in [facility] including her most recent inpatient mental health admission in 2016 at [facility]- 03/10/2015- she was diagnosed with MDD and was discharged on Lexapro and mirtazapine.'  Follow-up in clinic in 2 to 3 weeks or sooner if needed.  June 17 at 8:45 AM  I have spent atleast 60 minute non face to face with patient today. More than 50 % of the time was spent for psychoeducation and supportive psychotherapy and care coordination.  This note was generated in part or whole with voice  recognition software. Voice recognition is usually quite accurate but there are transcription errors that can and very often do occur. I apologize for any typographical errors that were not detected and corrected.        Ursula Alert, MD 6/3/20206:11 PM

## 2019-01-11 ENCOUNTER — Telehealth: Payer: Self-pay

## 2019-01-11 ENCOUNTER — Telehealth: Payer: Self-pay | Admitting: Psychiatry

## 2019-01-11 NOTE — Telephone Encounter (Signed)
called left a message that on her patient mychart it states to stop the lexapro and she thought she was to continue [place].  also she has questions about quetiapine if it affects the urinary track system.

## 2019-01-11 NOTE — Telephone Encounter (Signed)
Returned call to [facility] - discussed urinary retention as a side effect of seroquel. Also discussed that she is till on Lorazepam.

## 2019-01-20 ENCOUNTER — Encounter: Payer: Self-pay | Admitting: Psychiatry

## 2019-01-20 ENCOUNTER — Other Ambulatory Visit: Payer: Self-pay

## 2019-01-20 ENCOUNTER — Ambulatory Visit (INDEPENDENT_AMBULATORY_CARE_PROVIDER_SITE_OTHER): Payer: Medicare HMO | Admitting: Psychiatry

## 2019-01-20 DIAGNOSIS — F5105 Insomnia due to other mental disorder: Secondary | ICD-10-CM | POA: Diagnosis not present

## 2019-01-20 DIAGNOSIS — F331 Major depressive disorder, recurrent, moderate: Secondary | ICD-10-CM | POA: Diagnosis not present

## 2019-01-20 DIAGNOSIS — F410 Panic disorder [episodic paroxysmal anxiety] without agoraphobia: Secondary | ICD-10-CM | POA: Diagnosis not present

## 2019-01-20 DIAGNOSIS — F411 Generalized anxiety disorder: Secondary | ICD-10-CM | POA: Diagnosis not present

## 2019-01-20 MED ORDER — QUETIAPINE FUMARATE 25 MG PO TABS: 25.0000 mg | ORAL_TABLET | Freq: Every day | ORAL | 1 refills | Status: DC

## 2019-01-20 MED ORDER — CITALOPRAM HYDROBROMIDE 20 MG PO TABS: 40.0000 mg | ORAL_TABLET | Freq: Every day | ORAL | 1 refills | Status: DC

## 2019-01-20 NOTE — Progress Notes (Signed)
Virtual Visit via Video Note  I connected with Paige Dougherty on 01/20/19 at  8:45 AM EDT by a video enabled telemedicine application and verified that I am speaking with the correct person using two identifiers.   I discussed the limitations of evaluation and management by telemedicine and the availability of in person appointments. The patient expressed understanding and agreed to proceed.   I discussed the assessment and treatment plan with the patient. The patient was provided an opportunity to ask questions and all were answered. The patient agreed with the plan and demonstrated an understanding of the instructions.   The patient was advised to call back or seek an in-person evaluation if the symptoms worsen or if the condition fails to improve as anticipated.   [facility] MD OP Progress Note  01/20/2019 12:53 PM [name]  MRN:  [id number]  Chief Complaint:  Chief Complaint    Follow-up     HPI: Paige Dougherty is an 85 year old Caucasian female, married, lives with her husband in [place], has a history of depression, sleep problems, hypertension, IBS, diabetes melitis, history of breast cancer was evaluated by telemedicine today.  Patient's daughter [name] at [phone] who is the healthcare power of attorney provided collateral information.  Per patient as well as daughter patient is currently tolerating the Seroquel well.  She is sleeping better at night and that is an improvement.  Her mood also seems to be calmer than before.  She is not crying as much as she used to before.  Patient however continues to struggle with shakiness especially during the day.  Her lorazepam is currently given in divided dosage of 0.25 mg in the morning around 8 AM, and as a dosage around 1 PM, another dose around 5 PM and another dose at bedtime.  Patient reports she however continues to feel shaky in the morning.  She continues to take Celexa and mirtazapine.  Discussed about readjusting the dosage of Celexa.   She is agreeable.  Patient reports she does feel burning sensation inside her thighs, as well as in her underarms.  Discussed with patient as well as daughter that unknown if this is due to her medications.  However advised to monitor closely.  Patient appeared to be alert, oriented to person and situation.  Patient has upcoming appointment with therapist and advised to see the therapist more frequently given her anxiety symptoms.  Patient denies any suicidality, homicidality or perceptual disturbances.    Visit Diagnosis:    ICD-10-CM   1. MDD (major depressive disorder), recurrent episode, moderate (HCC)  F33.1 citalopram (CELEXA) 20 MG tablet    QUEtiapine (SEROQUEL) 25 MG tablet  2. GAD (generalized anxiety disorder)  F41.1 citalopram (CELEXA) 20 MG tablet    QUEtiapine (SEROQUEL) 25 MG tablet  3. Panic attacks  F41.0 citalopram (CELEXA) 20 MG tablet    QUEtiapine (SEROQUEL) 25 MG tablet  4. Insomnia due to mental disorder  F51.05 QUEtiapine (SEROQUEL) 25 MG tablet    Past Psychiatric History: I have reviewed past psychiatric history from my progress note on 01/06/2019.  Past trials of Lexapro, Celexa, mirtazapine, lorazepam, BuSpar.  Past Medical History:  Past Medical History:  Diagnosis Date  . Allergy   . Anxiety   . Arthritis    HANDS/FEET  . Barrett's esophagus   . Breast cancer ([facility]) 2016   RIGHT lumpectomy 2016 INVASIVE MAMMARY CARCINOMA   . Cancer of breast ([facility]) 12/24/2014   radiation- Right  . Chronic headache   . Depression   .  Diabetes mellitus   . Diverticulosis   . Esophageal stricture   . Fibrocystic breast disease   . GERD (gastroesophageal reflux disease)   . Hyperlipemia   . Hypertension   . IBS (irritable bowel syndrome)   . Personal history of radiation therapy 2016   RIGHT lumpectomy    INVASIVE MAMMARY CARCINOMA   . Urinary incontinence     Past Surgical History:  Procedure Laterality Date  . ABDOMINAL HYSTERECTOMY    . BREAST BIOPSY  Bilateral    core bxs  . BREAST EXCISIONAL BIOPSY Right 2016   +  . BREAST LUMPECTOMY Right 2016   INVASIVE MAMMARY CARCINOMA   . CARPAL TUNNEL RELEASE     bilateral  . CATARACT EXTRACTION     bilateral   . COLONOSCOPY    . DILATION AND CURETTAGE OF UTERUS    . finger cyst removal    . neck cyst removal    . UPPER GASTROINTESTINAL ENDOSCOPY      Family Psychiatric History: Reviewed family psychiatric history from my progress note on 01/06/2019  Family History:  Family History  Problem Relation Age of Onset  . Colon cancer Father   . Thyroid disease Father   . Thyroid disease Mother   . Hypertension Mother   . Alzheimer's disease Mother   . Colon cancer Maternal Grandmother   . Breast cancer Maternal Grandmother   . Anxiety disorder Daughter     Social History: Reviewed social history from my progress note on 01/06/2019. Social History   Socioeconomic History  . Marital status: Married    Spouse name: Not on file  . Number of children: Not on file  . Years of education: Not on file  . Highest education level: Not on file  Occupational History  . Not on file  Social Needs  . Financial resource strain: Not on file  . Food insecurity    Worry: Not on file    Inability: Not on file  . Transportation needs    Medical: Not on file    Non-medical: Not on file  Tobacco Use  . Smoking status: Never Smoker  . Smokeless tobacco: Never Used  Substance and Sexual Activity  . Alcohol use: Yes    Comment: occasional  . Drug use: No  . Sexual activity: Not on file  Lifestyle  . Physical activity    Days per week: Not on file    Minutes per session: Not on file  . Stress: Not on file  Relationships  . Social Herbalist on phone: Not on file    Gets together: Not on file    Attends religious service: Not on file    Active member of club or organization: Not on file    Attends meetings of clubs or organizations: Not on file    Relationship status: Not on file   Other Topics Concern  . Not on file  Social History Narrative  . Not on file    Allergies:  Allergies  Allergen Reactions  . Avapro [Irbesartan] Other (See Comments)    Other reaction(s): Unknown unknown  . Azithromycin Other (See Comments)    Extreme vaginal burning. unknown  . Clindamycin Other (See Comments)    Vaginal burning  . Duloxetine Other (See Comments)    Hyperactivity.  . Duloxetine Hcl Other (See Comments)    Hyperactivity.  . Ezetimibe-Simvastatin Other (See Comments)    Arthralgias.  . Lipitor [Atorvastatin] Other (See Comments)    "  muscle aches"  . Metronidazole Other (See Comments)  . Penicillins   . Prednisone Other (See Comments)    Dizziness/double vision  . Procaine Other (See Comments)    tremors  . Procaine Hcl     tremors  . Eggs Or Egg-Derived Products Other (See Comments)    Other Reaction: Not Assessed  . Other Other (See Comments) and Anxiety    Novacaine weakness  . Statins Rash    Arthralgias.  . Sulfa Antibiotics Rash    Vague history of a sulfa allergy, but does not remember the reaction.  . Tetracycline Rash    Metabolic Disorder Labs: No results found for: HGBA1C, MPG No results found for: PROLACTIN No results found for: CHOL, TRIG, HDL, CHOLHDL, VLDL, LDLCALC No results found for: TSH  Therapeutic Level Labs: No results found for: LITHIUM No results found for: VALPROATE No components found for:  CBMZ  Current Medications: Current Outpatient Medications  Medication Sig Dispense Refill  . calcium-vitamin D (OSCAL WITH D) 500-200 MG-UNIT per tablet Take 1 tablet by mouth 2 (two) times daily. 60 tablet 6  . citalopram (CELEXA) 20 MG tablet Take 2 tablets (40 mg total) by mouth daily. Start taking 1.5 tablets for 2 weeks and increase to 2 tablets. 60 tablet 1  . enalapril (VASOTEC) 10 MG tablet Take 10 mg by mouth 2 (two) times daily.    . hydrochlorothiazide (HYDRODIURIL) 25 MG tablet Take 25 mg by mouth daily.    Marland Kitchen  HYDROcodone-acetaminophen (NORCO) 5-325 MG tablet Take 1 tablet by mouth every 6 (six) hours as needed for up to 15 doses for severe pain. (Patient not taking: Reported on 12/03/2017) 15 tablet 0  . indomethacin (INDOCIN) 25 MG capsule Take 25 mg by mouth 3 (three) times daily as needed.    Marland Kitchen LORazepam (ATIVAN) 0.5 MG tablet Take 1 tablet (0.5 mg total) by mouth 2 (two) times daily. 30 tablet   . metFORMIN (GLUCOPHAGE) 500 MG tablet Take 500 mg by mouth daily with breakfast.    . metoprolol succinate (TOPROL-XL) 25 MG 24 hr tablet Take 1 tablet (25 mg total) by mouth daily.    . mirtazapine (REMERON SOL-TAB) 15 MG disintegrating tablet Take 1 tablet (15 mg total) by mouth at bedtime. 1 tablet 0  . omeprazole (PRILOSEC) 20 MG capsule Take 1 capsule (20 mg total) by mouth daily.    . QUEtiapine (SEROQUEL) 25 MG tablet Take 1 tablet (25 mg total) by mouth at bedtime. 30 tablet 1  . Red Yeast Rice 600 MG CAPS Take by mouth.    . vitamin B-12 (CYANOCOBALAMIN) 1000 MCG tablet Take 1 tablet (1,000 mcg total) by mouth daily.     No current facility-administered medications for this visit.      Musculoskeletal: Strength & Muscle Tone: within normal limits Gait & Station: normal Patient leans: N/A  Psychiatric Specialty Exam: Review of Systems  Psychiatric/Behavioral: The patient is nervous/anxious.   All other systems reviewed and are negative.   There were no vitals taken for this visit.There is no height or weight on file to calculate BMI.  General Appearance: Casual  Eye Contact:  Fair  Speech:  Clear and Coherent  Volume:  Normal  Mood:  Anxious  Affect:  Congruent  Thought Process:  Goal Directed and Descriptions of Associations: Intact  Orientation:  Full (Time, Place, and Person)  Thought Content: Logical   Suicidal Thoughts:  No  Homicidal Thoughts:  No  Memory:  Immediate;   Fair  Recent;   Fair Remote;   Fair  Judgement:  Fair  Insight:  Fair  Psychomotor Activity:  Normal   Concentration:  Concentration: Fair and Attention Span: Fair  Recall:  [employer] of Knowledge: Fair  Language: Fair  Akathisia:  No  Handed:  Right  AIMS (if indicated): denies stiffness, rigidity  Assets:  Communication Skills Desire for Improvement Social Support  ADL's:  Intact  Cognition: WNL  Sleep:  improving   Screenings: PHQ2-9     Follow Up  from 12/03/2017 in [place] Follow Up  from 07/01/2016 in [facility] Follow Up  from 07/03/2015 in [facility]  PHQ-2 Total Score  0  0  0       Assessment and Plan: Paige Dougherty is an 85 year old Caucasian female, married, lives in [place], has a history of depression, panic attacks, hypertension, IBS, diabetes, history of breast cancer was evaluated by telemedicine today.  Patient is biologically predisposed given her family history of mental health problems as well as her own health issues.  She also has psychosocial stressors of recent health problems of her husband, the current pandemic.  Patient with cognitive changes as well as worsening anxiety symptoms, currently on benzodiazepines.  Patient will benefit from medication readjustment as well as gradual tapering off of benzodiazepines.  Collateral information was obtained from her daughter [place].  Plan as noted below.  Plan MDD-some progress Increase Celexa to 30 mg p.o. daily for 2 weeks and then to 40 mg p.o. daily Mirtazapine 15 mg p.o. nightly Seroquel 25 mg p.o. daily at bedtime  For GAD- unstable Increase Celexa as summarized above. Seroquel 25 mg p.o. daily at bedtime.   Continue lorazepam 0.25 mg 4 times a day.  Discussed with patient as well as daughter to skip the bedtime dosage at least 2 times a week in an attempt to gradually wean her off.  For panic attacks-unstable Referred for psychotherapy sessions Continue Celexa and mirtazapine Lorazepam as  summarized above.  Insomnia-improving Seroquel 25 mg p.o. nightly She is also on mirtazapine which helps.  Follow-up in clinic in 4 weeks or sooner if needed.  Appointment on August 4 at 4:45 pm.  Advised to call the clinic for an earlier appointment as needed  in the next couple of weeks.  I have spent atleast 25 minutes non face to face with patient today. More than 50 % of the time was spent for psychoeducation and supportive psychotherapy and care coordination.  This note was generated in part or whole with voice recognition software. Voice recognition is usually quite accurate but there are transcription errors that can and very often do occur. I apologize for any typographical errors that were not detected and corrected.      Ursula Alert, MD 01/20/2019, 12:53 PM

## 2019-01-28 ENCOUNTER — Ambulatory Visit: Payer: Medicare HMO | Admitting: Licensed Clinical Social Worker

## 2019-01-28 ENCOUNTER — Other Ambulatory Visit: Payer: Self-pay

## 2019-02-04 ENCOUNTER — Ambulatory Visit: Payer: Medicare HMO | Admitting: Licensed Clinical Social Worker

## 2019-02-04 ENCOUNTER — Other Ambulatory Visit: Payer: Self-pay

## 2019-02-11 ENCOUNTER — Ambulatory Visit (INDEPENDENT_AMBULATORY_CARE_PROVIDER_SITE_OTHER): Payer: Medicare HMO | Admitting: Psychiatry

## 2019-02-11 ENCOUNTER — Ambulatory Visit: Payer: Medicare HMO | Admitting: Psychiatry

## 2019-02-11 ENCOUNTER — Ambulatory Visit: Payer: Medicare HMO | Admitting: Licensed Clinical Social Worker

## 2019-02-11 ENCOUNTER — Encounter: Payer: Self-pay | Admitting: Psychiatry

## 2019-02-11 ENCOUNTER — Other Ambulatory Visit: Payer: Self-pay

## 2019-02-11 DIAGNOSIS — F5105 Insomnia due to other mental disorder: Secondary | ICD-10-CM | POA: Diagnosis not present

## 2019-02-11 DIAGNOSIS — F410 Panic disorder [episodic paroxysmal anxiety] without agoraphobia: Secondary | ICD-10-CM

## 2019-02-11 DIAGNOSIS — F331 Major depressive disorder, recurrent, moderate: Secondary | ICD-10-CM

## 2019-02-11 DIAGNOSIS — F411 Generalized anxiety disorder: Secondary | ICD-10-CM

## 2019-02-11 MED ORDER — QUETIAPINE FUMARATE 25 MG PO TABS: 37.5000 mg | ORAL_TABLET | Freq: Every day | ORAL | 1 refills | Status: DC

## 2019-02-11 MED ORDER — MIRTAZAPINE 7.5 MG PO TABS: 7.5000 mg | ORAL_TABLET | Freq: Every day | ORAL | 1 refills | Status: DC

## 2019-02-11 NOTE — Progress Notes (Signed)
Virtual Visit via Video Note  I connected with Paige Dougherty on 02/11/19 at 11:30 AM EDT by a video enabled telemedicine application and verified that I am speaking with the correct person using two identifiers.   I discussed the limitations of evaluation and management by telemedicine and the availability of in person appointments. The patient expressed understanding and agreed to proceed.   I discussed the assessment and treatment plan with the patient. The patient was provided an opportunity to ask questions and all were answered. The patient agreed with the plan and demonstrated an understanding of the instructions.   The patient was advised to call back or seek an in-person evaluation if the symptoms worsen or if the condition fails to improve as anticipated.   [facility] MD OP Progress Note  02/11/2019 8:00 PM Paige Dougherty  MRN:  [id number]  Chief Complaint:  Chief Complaint    Follow-up     HPI: Paige Dougherty is an 85 year old Caucasian female, married, lives with her husband in [place] has a history of MDD, GAD, panic attacks, insomnia, hypertension, IBS, diabetes melitis, history of breast cancer was evaluated by telemedicine today.  Patient's daughter Paige Dougherty at [phone] who is the healthcare power of attorney provided collateral information.  Per patient as well as daughter reports  patient is currently struggling with some internal anxiety.  She also seems to have some sleep problems at night more so because of the burning sensation that she feels in her chest and under her arm.  This has been going on for a month or so.  Patient not sure if this started before the medications were readjusted or after.  But she seems to think it started after her lorazepam dosage was advised to be cut back.  Patient reports she currently wakes up at around 3 AM or 4 AM which is not typical for her.  She seems to be taking her Seroquel as prescribed.  She denies any other side effects at this  time.  Patient stopped taking melatonin once she was started on Seroquel.  She was on 10 mg in the past.  Patient appeared to be alert, oriented to person place situation.  Patient continues to meet her therapist on a weekly basis.  Patient has established care with her primary care and recently had changes in her metoprolol.    Visit Diagnosis:    ICD-10-CM   1. MDD (major depressive disorder), recurrent episode, moderate (HCC)  F33.1 mirtazapine (REMERON) 7.5 MG tablet    QUEtiapine (SEROQUEL) 25 MG tablet  2. GAD (generalized anxiety disorder)  F41.1 mirtazapine (REMERON) 7.5 MG tablet    QUEtiapine (SEROQUEL) 25 MG tablet  3. Panic attacks  F41.0 mirtazapine (REMERON) 7.5 MG tablet    QUEtiapine (SEROQUEL) 25 MG tablet  4. Insomnia due to mental disorder  F51.05 mirtazapine (REMERON) 7.5 MG tablet    QUEtiapine (SEROQUEL) 25 MG tablet    Past Psychiatric History: I have reviewed past psychiatric history from my progress note on 01/06/2019.  Past trials of Lexapro, Celexa, mirtazapine, lorazepam, BuSpar.  Past Medical History:  Past Medical History:  Diagnosis Date  . Allergy   . Anxiety   . Arthritis    HANDS/FEET  . Barrett's esophagus   . Breast cancer ([facility]) 2016   RIGHT lumpectomy 2016 INVASIVE MAMMARY CARCINOMA   . Cancer of breast ([facility]) 12/24/2014   radiation- Right  . Chronic headache   . Depression   . Diabetes mellitus   . Diverticulosis   .  Esophageal stricture   . Fibrocystic breast disease   . GERD (gastroesophageal reflux disease)   . Hyperlipemia   . Hypertension   . IBS (irritable bowel syndrome)   . Personal history of radiation therapy 2016   RIGHT lumpectomy    INVASIVE MAMMARY CARCINOMA   . Urinary incontinence     Past Surgical History:  Procedure Laterality Date  . ABDOMINAL HYSTERECTOMY    . BREAST BIOPSY Bilateral    core bxs  . BREAST EXCISIONAL BIOPSY Right 2016   +  . BREAST LUMPECTOMY Right 2016   INVASIVE MAMMARY CARCINOMA   .  CARPAL TUNNEL RELEASE     bilateral  . CATARACT EXTRACTION     bilateral   . COLONOSCOPY    . DILATION AND CURETTAGE OF UTERUS    . finger cyst removal    . neck cyst removal    . UPPER GASTROINTESTINAL ENDOSCOPY      Family Psychiatric History: I have reviewed family psychiatric history from my progress note on 01/06/2019.  Family History:  Family History  Problem Relation Age of Onset  . Colon cancer Father   . Thyroid disease Father   . Thyroid disease Mother   . Hypertension Mother   . Alzheimer's disease Mother   . Colon cancer Maternal Grandmother   . Breast cancer Maternal Grandmother   . Anxiety disorder Daughter     Social History: I have reviewed social history from my progress note on 01/06/2019. Social History   Socioeconomic History  . Marital status: Married    Spouse name: Not on file  . Number of children: Not on file  . Years of education: Not on file  . Highest education level: Not on file  Occupational History  . Not on file  Social Needs  . Financial resource strain: Not on file  . Food insecurity    Worry: Not on file    Inability: Not on file  . Transportation needs    Medical: Not on file    Non-medical: Not on file  Tobacco Use  . Smoking status: Never Smoker  . Smokeless tobacco: Never Used  Substance and Sexual Activity  . Alcohol use: Yes    Comment: occasional  . Drug use: No  . Sexual activity: Not on file  Lifestyle  . Physical activity    Days per week: Not on file    Minutes per session: Not on file  . Stress: Not on file  Relationships  . Social Herbalist on phone: Not on file    Gets together: Not on file    Attends religious service: Not on file    Active member of club or organization: Not on file    Attends meetings of clubs or organizations: Not on file    Relationship status: Not on file  Other Topics Concern  . Not on file  Social History Narrative  . Not on file    Allergies:  Allergies  Allergen  Reactions  . Avapro [Irbesartan] Other (See Comments)    Other reaction(s): Unknown unknown  . Azithromycin Other (See Comments)    Extreme vaginal burning. unknown  . Clindamycin Other (See Comments)    Vaginal burning  . Duloxetine Other (See Comments)    Hyperactivity.  . Duloxetine Hcl Other (See Comments)    Hyperactivity.  . Ezetimibe-Simvastatin Other (See Comments)    Arthralgias.  . Lipitor [Atorvastatin] Other (See Comments)    "muscle aches"  . Metronidazole  Other (See Comments)  . Penicillins   . Prednisone Other (See Comments)    Dizziness/double vision  . Procaine Other (See Comments)    tremors  . Procaine Hcl     tremors  . Eggs Or Egg-Derived Products Other (See Comments)    Other Reaction: Not Assessed  . Other Other (See Comments) and Anxiety    Novacaine weakness  . Statins Rash    Arthralgias.  . Sulfa Antibiotics Rash    Vague history of a sulfa allergy, but does not remember the reaction.  . Tetracycline Rash    Metabolic Disorder Labs: No results found for: HGBA1C, MPG No results found for: PROLACTIN No results found for: CHOL, TRIG, HDL, CHOLHDL, VLDL, LDLCALC No results found for: TSH  Therapeutic Level Labs: No results found for: LITHIUM No results found for: VALPROATE No components found for:  CBMZ  Current Medications: Current Outpatient Medications  Medication Sig Dispense Refill  . metoprolol succinate (TOPROL-XL) 50 MG 24 hr tablet Take by mouth.    . calcium-vitamin D (OSCAL WITH D) 500-200 MG-UNIT per tablet Take 1 tablet by mouth 2 (two) times daily. 60 tablet 6  . citalopram (CELEXA) 20 MG tablet Take 2 tablets (40 mg total) by mouth daily. Start taking 1.5 tablets for 2 weeks and increase to 2 tablets. 60 tablet 1  . enalapril (VASOTEC) 10 MG tablet Take 10 mg by mouth 2 (two) times daily.    . hydrochlorothiazide (HYDRODIURIL) 25 MG tablet Take 25 mg by mouth daily.    Mar[place] Kitchen HYDROcodone-acetaminophen (NORCO) 5-325 MG tablet  Take 1 tablet by mouth every 6 (six) hours as needed for up to 15 doses for severe pain. (Patient not taking: Reported on 12/03/2017) 15 tablet 0  . indomethacin (INDOCIN) 25 MG capsule Take 25 mg by mouth 3 (three) times daily as needed.    Marland Kitchen LORazepam (ATIVAN) 0.5 MG tablet Take 1 tablet (0.5 mg total) by mouth 2 (two) times daily. 30 tablet   . metFORMIN (GLUCOPHAGE) 500 MG tablet Take 500 mg by mouth daily with breakfast.    . metoprolol succinate (TOPROL-XL) 25 MG 24 hr tablet Take 1 tablet (25 mg total) by mouth daily.    . mirtazapine (REMERON) 7.5 MG tablet Take 1 tablet (7.5 mg total) by mouth at bedtime. Patient has supplies 30 tablet 1  . omeprazole (PRILOSEC) 20 MG capsule Take 1 capsule (20 mg total) by mouth daily.    . QUEtiapine (SEROQUEL) 25 MG tablet Take 1.5 tablets (37.5 mg total) by mouth at bedtime. 45 tablet 1  . Red Yeast Rice 600 MG CAPS Take by mouth.    . vitamin B-12 (CYANOCOBALAMIN) 1000 MCG tablet Take 1 tablet (1,000 mcg total) by mouth daily.     No current facility-administered medications for this visit.      Musculoskeletal: Strength & Muscle Tone: within normal limits Gait & Station: normal Patient leans: N/A  Psychiatric Specialty Exam: Review of Systems  Psychiatric/Behavioral: The patient is nervous/anxious and has insomnia.   All other systems reviewed and are negative.   There were no vitals taken for this visit.There is no height or weight on file to calculate BMI.  General Appearance: Casual  Eye Contact:  Fair  Speech:  Normal Rate  Volume:  Normal  Mood:  Anxious  Affect:  Congruent  Thought Process:  Goal Directed and Descriptions of Associations: Intact  Orientation:  Full (Time, Place, and Person)  Thought Content: Logical   Suicidal Thoughts:  No  Homicidal Thoughts:  No  Memory:  Immediate;   Fair Recent;   Fair Remote;   Fair  Judgement:  Fair  Insight:  Good  Psychomotor Activity:  Normal  Concentration:  Concentration: Fair  and Attention Span: Fair  Recall:  [employer] of Knowledge: Fair  Language: Fair  Akathisia:  No  Handed:  Right  AIMS (if indicated): denies tremors, rigidity  Assets:  Communication Skills Desire for Improvement Housing Social Support  ADL's:  Intact  Cognition: WNL  Sleep:  restless   Screenings: PHQ2-9     Follow Up  from 12/03/2017 in [facility] Follow Up  from 07/01/2016 in [facility] Follow Up  from 07/03/2015 in [facility]  PHQ-2 Total Score  0  0  0       Assessment and Plan: Chonte is an 85 year old Caucasian female, married, lives in [place], has a history of depression, panic attacks, hypertension, IBS, diabetes, history of breast cancer was evaluated by telemedicine today.  Patient is biologically predisposed given her family history of mental health problems as well as her own health issues.  She also has psychosocial stressors of recent health problems of her husband, current pandemic.  Patient with cognitive changes as well as worsening anxiety symptoms was referred to our clinic.  Patient currently continues to struggle with anxiety and sleep problems.  She will continue psychotherapy sessions as well as will continue to need medication management.  Plan MDD-some progress Celexa 40 mg p.o. daily Reduce mirtazapine to 7.5 mg p.o. nightly, taper down so that she can be taken off of it gradually. Increase Seroquel to 37.5 mg p.o. nightly  For GAD-unstable Seroquel as prescribed Change lorazepam to 0.25 mg twice a day and 0.5 mg at bedtime.  This is her previous dosage however the dosage has been spread out to help her better.  She will be gradually weaned off of lorazepam and that is a long-term goal.  For panic attacks- some progress Celexa and mirtazapine as discussed. She will continue to work with her therapist on a weekly basis. Lorazepam as  discussed above.  For insomnia-unstable Increase Seroquel to 37.5 mg p.o. nightly Decrease mirtazapine to 7.5 mg p.o. nightly Change lorazepam to 0.5 mg at bedtime. Discussed with patient as well as daughter to be cautious about orthostatic hypotension, falls given the fact that she is on multiple sleep medications as well as lorazepam. Mirtazapine will be gradually discontinued.  Discussed with patient as well as daughter that she will need the following labs-TSH, lipid panel, hemoglobin A1c, prolactin as well as an EKG for QTC monitoring. We will also send a message to her new primary care provider.  Follow-up in clinic in 1 month or sooner if needed.  September 1 at 2 PM.  I have spent atleast 25 minutes non face to face with patient today. More than 50 % of the time was spent for psychoeducation and supportive psychotherapy and care coordination.  This note was generated in part or whole with voice recognition software. Voice recognition is usually quite accurate but there are transcription errors that can and very often do occur. I apologize for any typographical errors that were not detected and corrected.          Ursula Alert, MD 02/11/2019, 8:00 PM

## 2019-03-03 ENCOUNTER — Ambulatory Visit (INDEPENDENT_AMBULATORY_CARE_PROVIDER_SITE_OTHER): Payer: Medicare HMO | Admitting: Psychiatry

## 2019-03-03 ENCOUNTER — Other Ambulatory Visit: Payer: Self-pay

## 2019-03-03 ENCOUNTER — Encounter: Payer: Self-pay | Admitting: Psychiatry

## 2019-03-03 DIAGNOSIS — F410 Panic disorder [episodic paroxysmal anxiety] without agoraphobia: Secondary | ICD-10-CM

## 2019-03-03 DIAGNOSIS — F411 Generalized anxiety disorder: Secondary | ICD-10-CM

## 2019-03-03 DIAGNOSIS — F331 Major depressive disorder, recurrent, moderate: Secondary | ICD-10-CM

## 2019-03-03 NOTE — Progress Notes (Signed)
Virtual Visit via Video Note  I connected with Paige Dougherty on 03/03/19 at  8:45 AM EDT by a video enabled telemedicine application and verified that I am speaking with the correct person using two identifiers.   I discussed the limitations of evaluation and management by telemedicine and the availability of in person appointments. The patient expressed understanding and agreed to proceed.   I discussed the assessment and treatment plan with the patient. The patient was provided an opportunity to ask questions and all were answered. The patient agreed with the plan and demonstrated an understanding of the instructions.   The patient was advised to call back or seek an in-person evaluation if the symptoms worsen or if the condition fails to improve as anticipated.   BH MD/PA/NP OP Progress Note  03/03/2019 12:47 PM Paige Dougherty  MRN:  [id number]  Chief Complaint:  Chief Complaint    Follow-up     HPI: Paige Dougherty is an 85 year old Caucasian female, married, lives with her husband in [place], has a history of MDD, GAD, panic attacks, insomnia, hypertension, IBS, diabetes melitis, history of breast cancer was evaluated by telemedicine today.  Patient's daughter Paige Dougherty at [phone] who is the healthcare power of attorney-provided collateral information.  Patient is a limited historian and hence majority of information was obtained from [place].  [facility] also helped to repeat questions when asked.  Patient also is hard of hearing.  Patient however appeared to be alert, oriented to person place situation.  Patient continues to struggle with worrying all the time.  She reports she is worried about her husband's health.  She reports she is unable to do anything around the house anymore since she is worried.  She is worried about taking a shower since she is afraid she might fall.  Patient reports her burning sensation of her chest and under arms have improved.  Patient continues to take  lorazepam as prescribed throughout the day which helps to some extent.  Patient reports she continues to struggle with sleep issues on and off and there are times when she wakes up too soon.  She continues to take mirtazapine low dosage.  She however reports she feels fatigued and tired during the day and wonders whether her medications could be contributing to it.  Per daughter Paige Dougherty patient continues to be overwhelmed and is worried about everything.  Her father who has health issues is currently recovering and is able to take care of himself.  Per daughter she has discussed with her mother that she could get some help around the house however she does not want that.  She however continues to be alert and worried all the time.  Paige Dougherty also wonders whether patient is overmedicated since she is tired and lethargic during the day.  Per daughter they could not continue psychotherapy sessions since her mother had told the therapist that therapy was not helping much and the therapist had discussed that she will contact them after talking to Probation officer.  However did not get back to them.  Discussed with patient as well as Paige Dougherty that Probation officer was out of office and that could be the reason that therapist did not get back with him.  Discussed with him that writer will send a message to the therapist and coordinate care.   Visit Diagnosis:    ICD-10-CM   1. MDD (major depressive disorder), recurrent episode, moderate (HCC)  F33.1   2. GAD (generalized anxiety disorder)  F41.1   3. Panic  attacks  F41.0     Past Psychiatric History: I have reviewed past psychiatric history from my progress note on 01/06/2019.  Past trials of Lexapro, Celexa, mirtazapine, lorazepam, BuSpar.  Past Medical History:  Past Medical History:  Diagnosis Date  . Allergy   . Anxiety   . Arthritis    HANDS/FEET  . Barrett's esophagus   . Breast cancer ([place]) 2016   RIGHT lumpectomy 2016 INVASIVE MAMMARY CARCINOMA   . Cancer of breast  ([place]) 12/24/2014   radiation- Right  . Chronic headache   . Depression   . Diabetes mellitus   . Diverticulosis   . Esophageal stricture   . Fibrocystic breast disease   . GERD (gastroesophageal reflux disease)   . Hyperlipemia   . Hypertension   . IBS (irritable bowel syndrome)   . Personal history of radiation therapy 2016   RIGHT lumpectomy    INVASIVE MAMMARY CARCINOMA   . Urinary incontinence     Past Surgical History:  Procedure Laterality Date  . ABDOMINAL HYSTERECTOMY    . BREAST BIOPSY Bilateral    core bxs  . BREAST EXCISIONAL BIOPSY Right 2016   +  . BREAST LUMPECTOMY Right 2016   INVASIVE MAMMARY CARCINOMA   . CARPAL TUNNEL RELEASE     bilateral  . CATARACT EXTRACTION     bilateral   . COLONOSCOPY    . DILATION AND CURETTAGE OF UTERUS    . finger cyst removal    . neck cyst removal    . UPPER GASTROINTESTINAL ENDOSCOPY      Family Psychiatric History: I have reviewed family psychiatric history from my progress note on 01/06/2019.  Family History:  Family History  Problem Relation Age of Onset  . Colon cancer Father   . Thyroid disease Father   . Thyroid disease Mother   . Hypertension Mother   . Alzheimer's disease Mother   . Colon cancer Maternal Grandmother   . Breast cancer Maternal Grandmother   . Anxiety disorder Daughter     Social History: I have reviewed social history from my progress note on 01/06/2019. Social History   Socioeconomic History  . Marital status: Married    Spouse name: Not on file  . Number of children: Not on file  . Years of education: Not on file  . Highest education level: Not on file  Occupational History  . Not on file  Social Needs  . Financial resource strain: Not on file  . Food insecurity    Worry: Not on file    Inability: Not on file  . Transportation needs    Medical: Not on file    Non-medical: Not on file  Tobacco Use  . Smoking status: Never Smoker  . Smokeless tobacco: Never Used  Substance and  Sexual Activity  . Alcohol use: Yes    Comment: occasional  . Drug use: No  . Sexual activity: Not on file  Lifestyle  . Physical activity    Days per week: Not on file    Minutes per session: Not on file  . Stress: Not on file  Relationships  . Social Herbalist on phone: Not on file    Gets together: Not on file    Attends religious service: Not on file    Active member of club or organization: Not on file    Attends meetings of clubs or organizations: Not on file    Relationship status: Not on file  Other Topics  Concern  . Not on file  Social History Narrative  . Not on file    Allergies:  Allergies  Allergen Reactions  . Avapro [Irbesartan] Other (See Comments)    Other reaction(s): Unknown unknown  . Azithromycin Other (See Comments)    Extreme vaginal burning. unknown  . Buspirone Other (See Comments)    Burning sensations and felt overly hot   . Clindamycin Other (See Comments)    Vaginal burning  . Duloxetine Other (See Comments)    Hyperactivity.  . Duloxetine Hcl Other (See Comments)    Hyperactivity.  . Ezetimibe-Simvastatin Other (See Comments)    Arthralgias.  . Lipitor [Atorvastatin] Other (See Comments)    "muscle aches"  . Metronidazole Other (See Comments)  . Penicillins   . Prednisone Other (See Comments)    Dizziness/double vision  . Procaine Other (See Comments)    tremors  . Procaine Hcl     tremors  . Eggs Or Egg-Derived Products Other (See Comments)    Other Reaction: Not Assessed  . Other Other (See Comments) and Anxiety    Novacaine weakness  . Statins Rash    Arthralgias.  . Sulfa Antibiotics Rash    Vague history of a sulfa allergy, but does not remember the reaction.  . Tetracycline Rash    Metabolic Disorder Labs: No results found for: HGBA1C, MPG No results found for: PROLACTIN No results found for: CHOL, TRIG, HDL, CHOLHDL, VLDL, LDLCALC No results found for: TSH  Therapeutic Level Labs: No results found  for: LITHIUM No results found for: VALPROATE No components found for:  CBMZ  Current Medications: Current Outpatient Medications  Medication Sig Dispense Refill  . calcium-vitamin D (OSCAL WITH D) 500-200 MG-UNIT per tablet Take 1 tablet by mouth 2 (two) times daily. 60 tablet 6  . citalopram (CELEXA) 20 MG tablet Take 2 tablets (40 mg total) by mouth daily. Start taking 1.5 tablets for 2 weeks and increase to 2 tablets. 60 tablet 1  . enalapril (VASOTEC) 10 MG tablet Take 10 mg by mouth 2 (two) times daily.    . hydrochlorothiazide (HYDRODIURIL) 25 MG tablet Take 25 mg by mouth daily.    Marland Kitchen HYDROcodone-acetaminophen (NORCO) 5-325 MG tablet Take 1 tablet by mouth every 6 (six) hours as needed for up to 15 doses for severe pain. (Patient not taking: Reported on 12/03/2017) 15 tablet 0  . indomethacin (INDOCIN) 25 MG capsule Take 25 mg by mouth 3 (three) times daily as needed.    Marland Kitchen LORazepam (ATIVAN) 0.5 MG tablet Take 1 tablet (0.5 mg total) by mouth 2 (two) times daily. 30 tablet   . metFORMIN (GLUCOPHAGE) 500 MG tablet Take 500 mg by mouth daily with breakfast.    . metoprolol succinate (TOPROL-XL) 25 MG 24 hr tablet Take 1 tablet (25 mg total) by mouth daily.    . metoprolol succinate (TOPROL-XL) 50 MG 24 hr tablet Take by mouth.    Marland Kitchen omeprazole (PRILOSEC) 20 MG capsule Take 1 capsule (20 mg total) by mouth daily.    . QUEtiapine (SEROQUEL) 25 MG tablet Take 1.5 tablets (37.5 mg total) by mouth at bedtime. 45 tablet 1  . Red Yeast Rice 600 MG CAPS Take by mouth.    . vitamin B-12 (CYANOCOBALAMIN) 1000 MCG tablet Take 1 tablet (1,000 mcg total) by mouth daily.     No current facility-administered medications for this visit.      Musculoskeletal: Strength & Muscle Tone: UTA Gait & Station: Observed as sitting Patient  leans: N/A  Psychiatric Specialty Exam: Review of Systems  Psychiatric/Behavioral: The patient is nervous/anxious and has insomnia.   All other systems reviewed and are  negative.   There were no vitals taken for this visit.There is no height or weight on file to calculate BMI.  General Appearance: Casual  Eye Contact:  Fair  Speech:  Clear and Coherent  Volume:  Normal  Mood:  Anxious  Affect:  Congruent  Thought Process:  Goal Directed and Descriptions of Associations: Intact  Orientation:  Full (Time, Place, and Person)  Thought Content: Logical   Suicidal Thoughts:  No  Homicidal Thoughts:  No  Memory:  Immediate;   Fair Recent;   Fair Remote;   Fair  Judgement:  Fair  Insight:  Fair  Psychomotor Activity:  Normal  Concentration:  Concentration: Fair and Attention Span: Fair  Recall:  [employer] of Knowledge: Fair  Language: Fair  Akathisia:  No  Handed:  Right  AIMS (if indicated): denies stiffness  Assets:  Communication Skills Desire for Improvement Social Support  ADL's:  Intact  Cognition: WNL  Sleep:  Restless on and off   Screenings: PHQ2-9     Follow Up  from 12/03/2017 in [place] Follow Up  from 07/01/2016 in [place] Follow Up  from 07/03/2015 in [place]  PHQ-2 Total Score  0  0  0       Assessment and Plan: Jurnei is an 85 year old Caucasian female, married, lives in [place], has a history of MDD, GAD, panic attacks, hypertension, IBS, diabetes, history of breast cancer was evaluated by telemedicine today.  She is biologically predisposed given her family history of mental health problems as well as her own health issues.  She also has psychosocial stressors of her husband's health problems current pandemic.  Patient continues to have significant anxiety symptoms.  Patient will continue to benefit from medications as well as psychotherapy sessions.  However she has been unable to continue psychotherapy sessions as summarized above.  We will coordinate care with therapist.  Plan For MDD- some  progress Celexa 40 mg p.o. daily Discontinue mirtazapine since she is complaining of fatigue Continue Seroquel 37.5 mg p.o. nightly  For GAD-unstable Seroquel as prescribed Lorazepam 0.25 mg p.o. twice daily and 0.5 mg at bedtime. Continue CBT.  For insomnia- restless Seroquel as prescribed Discontinue mirtazapine Restart melatonin 5 mg p.o. nightly Continue lorazepam 0.5 mg at bedtime  Collateral information was obtained daughter Paige Dougherty as summarized above We will coordinate care with Ms. Peacock-have sent message.     Pending labs-TSH, lipid panel, hemoglobin A1c, prolactin as well as EKG for QTC monitoring.  Follow-up in clinic in 4 weeks or sooner if needed.  I have spent atleast 25 minutes non face to face with patient today. More than 50 % of the time was spent for psychoeducation and supportive psychotherapy and care coordination.  This note was generated in part or whole with voice recognition software. Voice recognition is usually quite accurate but there are transcription errors that can and very often do occur. I apologize for any typographical errors that were not detected and corrected.          Ursula Alert, MD 03/03/2019, 12:47 PM

## 2019-03-04 ENCOUNTER — Ambulatory Visit: Payer: Medicare HMO | Admitting: Psychiatry

## 2019-03-06 ENCOUNTER — Encounter: Payer: Self-pay | Admitting: Emergency Medicine

## 2019-03-06 ENCOUNTER — Other Ambulatory Visit: Payer: Self-pay

## 2019-03-06 ENCOUNTER — Inpatient Hospital Stay
Admission: EM | Admit: 2019-03-06 | Discharge: 2019-03-11 | DRG: 918 | Disposition: A | Discharge status: Skilled Nursing Facility | Payer: Medicare HMO | Attending: Internal Medicine | Admitting: Internal Medicine

## 2019-03-06 DIAGNOSIS — Z7984 Long term (current) use of oral hypoglycemic drugs: Secondary | ICD-10-CM

## 2019-03-06 DIAGNOSIS — K219 Gastro-esophageal reflux disease without esophagitis: Secondary | ICD-10-CM | POA: Diagnosis present

## 2019-03-06 DIAGNOSIS — T43222A Poisoning by selective serotonin reuptake inhibitors, intentional self-harm, initial encounter: Secondary | ICD-10-CM | POA: Diagnosis not present

## 2019-03-06 DIAGNOSIS — F410 Panic disorder [episodic paroxysmal anxiety] without agoraphobia: Secondary | ICD-10-CM | POA: Diagnosis present

## 2019-03-06 DIAGNOSIS — F5105 Insomnia due to other mental disorder: Secondary | ICD-10-CM

## 2019-03-06 DIAGNOSIS — Z803 Family history of malignant neoplasm of breast: Secondary | ICD-10-CM

## 2019-03-06 DIAGNOSIS — T50902A Poisoning by unspecified drugs, medicaments and biological substances, intentional self-harm, initial encounter: Secondary | ICD-10-CM | POA: Diagnosis present

## 2019-03-06 DIAGNOSIS — Z8249 Family history of ischemic heart disease and other diseases of the circulatory system: Secondary | ICD-10-CM

## 2019-03-06 DIAGNOSIS — E785 Hyperlipidemia, unspecified: Secondary | ICD-10-CM | POA: Diagnosis present

## 2019-03-06 DIAGNOSIS — Z853 Personal history of malignant neoplasm of breast: Secondary | ICD-10-CM

## 2019-03-06 DIAGNOSIS — Z820 Family history of epilepsy and other diseases of the nervous system: Secondary | ICD-10-CM

## 2019-03-06 DIAGNOSIS — N179 Acute kidney failure, unspecified: Secondary | ICD-10-CM | POA: Diagnosis present

## 2019-03-06 DIAGNOSIS — Z915 Personal history of self-harm: Secondary | ICD-10-CM

## 2019-03-06 DIAGNOSIS — Z800 Family history of malignant neoplasm of digestive organs: Secondary | ICD-10-CM

## 2019-03-06 DIAGNOSIS — Z8349 Family history of other endocrine, nutritional and metabolic diseases: Secondary | ICD-10-CM

## 2019-03-06 DIAGNOSIS — F411 Generalized anxiety disorder: Secondary | ICD-10-CM

## 2019-03-06 DIAGNOSIS — Z79899 Other long term (current) drug therapy: Secondary | ICD-10-CM

## 2019-03-06 DIAGNOSIS — Z79891 Long term (current) use of opiate analgesic: Secondary | ICD-10-CM

## 2019-03-06 DIAGNOSIS — I10 Essential (primary) hypertension: Secondary | ICD-10-CM | POA: Diagnosis present

## 2019-03-06 DIAGNOSIS — Z20828 Contact with and (suspected) exposure to other viral communicable diseases: Secondary | ICD-10-CM | POA: Diagnosis present

## 2019-03-06 DIAGNOSIS — E119 Type 2 diabetes mellitus without complications: Secondary | ICD-10-CM | POA: Diagnosis present

## 2019-03-06 DIAGNOSIS — F331 Major depressive disorder, recurrent, moderate: Secondary | ICD-10-CM

## 2019-03-06 DIAGNOSIS — I959 Hypotension, unspecified: Secondary | ICD-10-CM | POA: Diagnosis present

## 2019-03-06 DIAGNOSIS — F332 Major depressive disorder, recurrent severe without psychotic features: Secondary | ICD-10-CM | POA: Diagnosis present

## 2019-03-06 LAB — URINALYSIS, COMPLETE (UACMP) WITH MICROSCOPIC
Bacteria, UA: NONE SEEN
Bilirubin Urine: NEGATIVE
Glucose, UA: NEGATIVE mg/dL
Hgb urine dipstick: NEGATIVE
Ketones, ur: NEGATIVE mg/dL
Leukocytes,Ua: NEGATIVE
Nitrite: NEGATIVE
Protein, ur: NEGATIVE mg/dL
Specific Gravity, Urine: 1.016 (ref 1.005–1.030)
pH: 5 (ref 5.0–8.0)

## 2019-03-06 LAB — CBC WITH DIFFERENTIAL/PLATELET
Abs Immature Granulocytes: 0.04 10*3/uL (ref 0.00–0.07)
Basophils Absolute: 0 10*3/uL (ref 0.0–0.1)
Basophils Relative: 1 %
Eosinophils Absolute: 0.1 10*3/uL (ref 0.0–0.5)
Eosinophils Relative: 2 %
HCT: 37.2 % (ref 36.0–46.0)
Hemoglobin: 12.1 g/dL (ref 12.0–15.0)
Immature Granulocytes: 1 %
Lymphocytes Relative: 25 %
Lymphs Abs: 1.6 10*3/uL (ref 0.7–4.0)
MCH: 30.9 pg (ref 26.0–34.0)
MCHC: 32.5 g/dL (ref 30.0–36.0)
MCV: 95.1 fL (ref 80.0–100.0)
Monocytes Absolute: 0.6 10*3/uL (ref 0.1–1.0)
Monocytes Relative: 10 %
Neutro Abs: 4 10*3/uL (ref 1.7–7.7)
Neutrophils Relative %: 61 %
Platelets: 214 10*3/uL (ref 150–400)
RBC: 3.91 MIL/uL (ref 3.87–5.11)
RDW: 14.3 % (ref 11.5–15.5)
WBC: 6.4 10*3/uL (ref 4.0–10.5)
nRBC: 0 % (ref 0.0–0.2)

## 2019-03-06 LAB — COMPREHENSIVE METABOLIC PANEL
ALT: 18 U/L (ref 0–44)
AST: 19 U/L (ref 15–41)
Albumin: 3.3 g/dL — ABNORMAL LOW (ref 3.5–5.0)
Alkaline Phosphatase: 67 U/L (ref 38–126)
Anion gap: 6 (ref 5–15)
BUN: 32 mg/dL — ABNORMAL HIGH (ref 8–23)
CO2: 24 mmol/L (ref 22–32)
Calcium: 9.4 mg/dL (ref 8.9–10.3)
Chloride: 108 mmol/L (ref 98–111)
Creatinine, Ser: 1.24 mg/dL — ABNORMAL HIGH (ref 0.44–1.00)
GFR calc Af Amer: 46 mL/min — ABNORMAL LOW (ref >60)
GFR calc non Af Amer: 40 mL/min — ABNORMAL LOW (ref >60)
Glucose, Bld: 118 mg/dL — ABNORMAL HIGH (ref 70–99)
Potassium: 4.1 mmol/L (ref 3.5–5.1)
Sodium: 138 mmol/L (ref 135–145)
Total Bilirubin: 0.7 mg/dL (ref 0.3–1.2)
Total Protein: 5.9 g/dL — ABNORMAL LOW (ref 6.5–8.1)

## 2019-03-06 LAB — ETHANOL: Alcohol, Ethyl (B): <10 mg/dL (ref <10)

## 2019-03-06 LAB — URINE DRUG SCREEN, QUALITATIVE (ARMC ONLY)
Amphetamines, Ur Screen: NOT DETECTED
Barbiturates, Ur Screen: NOT DETECTED
Benzodiazepine, Ur Scrn: POSITIVE — AB
Cannabinoid 50 Ng, Ur ~~LOC~~: NOT DETECTED
Cocaine Metabolite,Ur ~~LOC~~: NOT DETECTED
MDMA (Ecstasy)Ur Screen: NOT DETECTED
Methadone Scn, Ur: NOT DETECTED
Opiate, Ur Screen: NOT DETECTED
Phencyclidine (PCP) Ur S: NOT DETECTED
Tricyclic, Ur Screen: POSITIVE — AB

## 2019-03-06 LAB — ACETAMINOPHEN LEVEL: Acetaminophen (Tylenol), Serum: <10 ug/mL — ABNORMAL LOW (ref 10–30)

## 2019-03-06 LAB — SALICYLATE LEVEL: Salicylate Lvl: <7 mg/dL (ref 2.8–30.0)

## 2019-03-06 NOTE — BH Assessment (Addendum)
Assessment Note  Paige Dougherty is an 85 y.o. female.  The pt came in after overdosing on 10-20 pills.  The pt stated, "Why can't I just die?  I thought it was the right time"  She stated she is worried about everything, such as her husbands health.  She has had a previous attempt about 10 years ago of overdosing.  She is is seeing a psychiatrist at [facility].  She lives with her husband.  The pt denies self harm, HI, legal issues, history of abuse and hallucinations.  She stated she is sleeping about 4 hours a night and has a fair appetite.  She reports feeling hopeless and having a hard time getting out of bed.  She denies SA and her UDS is negative for all substances except benzodiazepines, which she is prescribed.  Pt is dressed in a hospital gown. He is drowsy and oriented x4. Pt speaks in a clear tone, at a low volume and slow pace. Eye contact is poor. Pt's mood is depresed. Thought process is coherent and relevant. There is no indication Pt is currently responding to internal stimuli or experiencing delusional thought content.?Pt was cooperative throughout assessment.    Diagnosis: F33.2 Major depressive disorder, Recurrent episode, Severe  Past Medical History:  Past Medical History:  Diagnosis Date  . Allergy   . Anxiety   . Arthritis    HANDS/FEET  . Barrett's esophagus   . Breast cancer ([place]) 2016   RIGHT lumpectomy 2016 INVASIVE MAMMARY CARCINOMA   . Cancer of breast ([place]) 12/24/2014   radiation- Right  . Chronic headache   . Depression   . Diabetes mellitus   . Diverticulosis   . Esophageal stricture   . Fibrocystic breast disease   . GERD (gastroesophageal reflux disease)   . Hyperlipemia   . Hypertension   . IBS (irritable bowel syndrome)   . Personal history of radiation therapy 2016   RIGHT lumpectomy    INVASIVE MAMMARY CARCINOMA   . Urinary incontinence     Past Surgical History:  Procedure Laterality Date  . ABDOMINAL HYSTERECTOMY    . BREAST  BIOPSY Bilateral    core bxs  . BREAST EXCISIONAL BIOPSY Right 2016   +  . BREAST LUMPECTOMY Right 2016   INVASIVE MAMMARY CARCINOMA   . CARPAL TUNNEL RELEASE     bilateral  . CATARACT EXTRACTION     bilateral   . COLONOSCOPY    . DILATION AND CURETTAGE OF UTERUS    . finger cyst removal    . neck cyst removal    . UPPER GASTROINTESTINAL ENDOSCOPY      Family History:  Family History  Problem Relation Age of Onset  . Colon cancer Father   . Thyroid disease Father   . Thyroid disease Mother   . Hypertension Mother   . Alzheimer's disease Mother   . Colon cancer Maternal Grandmother   . Breast cancer Maternal Grandmother   . Anxiety disorder Daughter     Social History:  reports that she has never smoked. She has never used smokeless tobacco. She reports current alcohol use. She reports that she does not use drugs.  Additional Social History:  Alcohol / Drug Use Pain Medications: See MAR Prescriptions: See MAR Over the Counter: See MAR History of alcohol / drug use?: No history of alcohol / drug abuse Longest period of sobriety (when/how long): NA  CIWA: CIWA-Ar BP: 117/61 Pulse Rate: (!) 51 COWS:    Allergies:  Allergies  Allergen Reactions  . Avapro [Irbesartan] Other (See Comments)    Other reaction(s): Unknown unknown  . Azithromycin Other (See Comments)    Extreme vaginal burning. unknown  . Buspirone Other (See Comments)    Burning sensations and felt overly hot   . Clindamycin Other (See Comments)    Vaginal burning  . Duloxetine Other (See Comments)    Hyperactivity.  . Duloxetine Hcl Other (See Comments)    Hyperactivity.  . Ezetimibe-Simvastatin Other (See Comments)    Arthralgias.  . Lipitor [Atorvastatin] Other (See Comments)    "muscle aches"  . Metronidazole Other (See Comments)  . Penicillins   . Prednisone Other (See Comments)    Dizziness/double vision  . Procaine Other (See Comments)    tremors  . Procaine Hcl     tremors  .  Eggs Or Egg-Derived Products Other (See Comments)    Other Reaction: Not Assessed  . Other Other (See Comments) and Anxiety    Novacaine weakness  . Statins Rash    Arthralgias.  . Sulfa Antibiotics Rash    Vague history of a sulfa allergy, but does not remember the reaction.  . Tetracycline Rash    Home Medications: (Not in a hospital admission)   OB/GYN Status:  No LMP recorded. Patient has had a hysterectomy.  General Assessment Data Location of Assessment:  [facility] ED TTS Assessment: In system Is this a Tele or Face-to-Face Assessment?: Face-to-Face Is this an Initial Assessment or a Re-assessment for this encounter?: Initial Assessment Patient Accompanied by:: N/A Language Other than English: No Living Arrangements: Other (Comment)(home) What gender do you identify as?: Female Marital status: Married Pregnancy Status: No Living Arrangements: Spouse/significant other Can pt return to current living arrangement?: Yes Admission Status: Involuntary Petitioner: ED Attending Is patient capable of signing voluntary admission?: No Referral Source: Self/Family/Friend Insurance type: Medicare     Crisis Care Plan Living Arrangements: Spouse/significant other Legal Guardian: Other:(Self) Name of Psychiatrist: [facility] Name of Therapist: [facility]  Education Status Is patient currently in school?: No Is the patient employed, unemployed or receiving disability?: (retired)  Risk to self with the past 6 months Suicidal Ideation: Yes-Currently Present Has patient been a risk to self within the past 6 months prior to admission? : Yes Suicidal Intent: Yes-Currently Present Has patient had any suicidal intent within the past 6 months prior to admission? : Yes Is patient at risk for suicide?: Yes Suicidal Plan?: Yes-Currently Present Has patient had any suicidal plan within the past 6 months prior to admission? : Yes Specify Current Suicidal Plan: OD on  pills Access to Means: Yes Specify Access to Suicidal Means: has pills What has been your use of drugs/alcohol within the last 12 months?: none Previous Attempts/Gestures: Yes How many times?: 2 Other Self Harm Risks: none Triggers for Past Attempts: Unpredictable Intentional Self Injurious Behavior: None Family Suicide History: No Recent stressful life event(s): Other (Comment)(worried about husband) Persecutory voices/beliefs?: No Depression: Yes Depression Symptoms: Despondent, Feeling worthless/self pity, Loss of interest in usual pleasures Substance abuse history and/or treatment for substance abuse?: No Suicide prevention information given to non-admitted patients: Not applicable  Risk to Others within the past 6 months Homicidal Ideation: No Does patient have any lifetime risk of violence toward others beyond the six months prior to admission? : No Thoughts of Harm to Others: No Current Homicidal Intent: No Current Homicidal Plan: No Access to Homicidal Means: No Identified Victim: pt denies History of harm to others?: No Assessment of  Violence: None Noted Violent Behavior Description: none Does patient have access to weapons?: No Criminal Charges Pending?: No Does patient have a court date: No Is patient on probation?: No  Psychosis Hallucinations: None noted Delusions: None noted  Mental Status Report Appearance/Hygiene: In hospital gown, Unremarkable Eye Contact: Poor Motor Activity: Unable to assess Speech: Soft, Slow Level of Consciousness: Drowsy Mood: Depressed Affect: Depressed Anxiety Level: None Thought Processes: Coherent, Relevant Judgement: Impaired Orientation: Person, Place, Time, Situation Obsessive Compulsive Thoughts/Behaviors: None  Cognitive Functioning Concentration: Normal Memory: Recent Intact, Remote Intact Is patient IDD: No Insight: Poor Impulse Control: Poor Appetite: Fair Have you had any weight changes? : No Change Sleep:  Decreased Total Hours of Sleep: 4 Vegetative Symptoms: None  ADLScreening [facility] Assessment Services) Patient's cognitive ability adequate to safely complete daily activities?: Yes Patient able to express need for assistance with ADLs?: Yes Independently performs ADLs?: Yes (appropriate for developmental age)  Prior Inpatient Therapy Prior Inpatient Therapy: Yes Prior Therapy Dates: 2010 Prior Therapy Facilty/Provider(s): [facility] Reason for Treatment: SI  Prior Outpatient Therapy Prior Outpatient Therapy: Yes Prior Therapy Dates: current Prior Therapy [facility]ilty/Provider([facility] Reason for Treatment: depression Does patient have an ACCT team?: No Does patient have Intensive In-House Services?  : No Does patient have [facility] services? : No Does patient have P4CC services?: No  ADL Screening (condition at time of admission) Patient's cognitive ability adequate to safely complete daily activities?: Yes Patient able to express need for assistance with ADLs?: Yes Independently performs ADLs?: Yes (appropriate for developmental age)       Abuse/Neglect Assessment (Assessment to be complete while patient is alone) Abuse/Neglect Assessment Can Be Completed: Yes Physical Abuse: Denies Verbal Abuse: Denies Sexual Abuse: Denies Exploitation of patient/patient's resources: Denies Self-Neglect: Denies Values / Beliefs Cultural Requests During Hospitalization: None Spiritual Requests During Hospitalization: None Consults Spiritual Care Consult Needed: No Social Work Consult Needed: No Regulatory affairs officer (For Healthcare) Does Patient Have a Medical Advance Directive?: Yes Does patient want to make changes to medical advance directive?: No - Patient declined Type of Advance Directive: Healthcare Power of [place] in Chart?: No - copy requested Would patient like information on creating a medical advance directive?: No - Patient  declined          Disposition:  Disposition Initial Assessment Completed for this Encounter: Yes  On Site Evaluation by:   Reviewed with Physician:    Enzo Montgomery 03/06/2019 11:44 PM

## 2019-03-06 NOTE — ED Provider Notes (Signed)
[facility] Emergency Department Provider Note   ____________________________________________   First MD Initiated Contact with Patient 03/06/19 1925     (approximate)  I have reviewed the triage vital signs and the nursing notes.   HISTORY  Chief Complaint Drug Overdose    HPI Paige Dougherty is a 85 y.o. female who reports that at 6:00 she took an overdose.  She is on multiple medications but the overdose probably involve citalopram lorazepam mirtazapine and or melatonin.  I called poison control and read them her entire med list they are most worried about the citalopram.  They report she will need every 4 EKGs and 24 hours cardiac monitoring before she is cleared.  She is already sleepy probably from the lorazepam.  Blood pressure and heart rate and O2 sats are currently stable but we will monitor them carefully and have her on q. 5-minute blood pressure checks in case she overdosed on her enalapril or metoprolol.  Patient did say she was trying to kill her self.         Past Medical History:  Diagnosis Date  . Allergy   . Anxiety   . Arthritis    HANDS/FEET  . Barrett's esophagus   . Breast cancer ([place]) 2016   RIGHT lumpectomy 2016 INVASIVE MAMMARY CARCINOMA   . Cancer of breast ([place]) 12/24/2014   radiation- Right  . Chronic headache   . Depression   . Diabetes mellitus   . Diverticulosis   . Esophageal stricture   . Fibrocystic breast disease   . GERD (gastroesophageal reflux disease)   . Hyperlipemia   . Hypertension   . IBS (irritable bowel syndrome)   . Personal history of radiation therapy 2016   RIGHT lumpectomy    INVASIVE MAMMARY CARCINOMA   . Urinary incontinence     Patient Active Problem List   Diagnosis Date Noted  . MDD (major depressive disorder), recurrent episode, moderate ([place]) 01/20/2019  . GAD (generalized anxiety disorder) 01/20/2019  . Panic attacks 01/20/2019  . Breast cancer of upper-inner quadrant of right  female breast ([place]) 01/22/2016  . Chronic gouty arthritis 03/14/2015  . Endocrinal hypertension 03/14/2015  . Fast heart beat 03/14/2015  . Electrolyte imbalance 03/14/2015  . H/O malignant neoplasm of breast 03/14/2015  . Malignant neoplastic disease ([place]) 03/11/2015  . Adaptive colitis 03/11/2015  . Type 2 diabetes mellitus ([place]) 03/11/2015  . Major depressive disorder, single episode, severe without psychotic features ([place])   . Depression, major, single episode, severe ([place]) 03/08/2015  . Overdose of benzodiazepine 03/05/2015  . Overdose of antidepressant 03/05/2015  . Overdose of opiate or related narcotic ([place]) 03/05/2015  . Suicide attempt ([place]) 03/05/2015  . Acute respiratory failure with hypoxemia ([place]) 03/05/2015  . Cancer of breast ([place]) 12/24/2014  . Urinary incontinence   . Fibrocystic breast disease   . Chronic headache   . Benign essential HTN 02/22/2014  . Diabetes mellitus ([place]) 02/22/2014  . Anxiety 04/11/2009  . Depression 04/11/2009  . ESOPHAGEAL STRICTURE 04/11/2009  . GERD 04/11/2009  . DIVERTICULOSIS, COLON 04/11/2009  . IRRITABLE BOWEL SYNDROME 04/11/2009  . ABDOMINAL PAIN, UNSPECIFIED SITE 04/11/2009    Past Surgical History:  Procedure Laterality Date  . ABDOMINAL HYSTERECTOMY    . BREAST BIOPSY Bilateral    core bxs  . BREAST EXCISIONAL BIOPSY Right 2016   +  . BREAST LUMPECTOMY Right 2016   INVASIVE MAMMARY CARCINOMA   . CARPAL TUNNEL RELEASE     bilateral  .  CATARACT EXTRACTION     bilateral   . COLONOSCOPY    . DILATION AND CURETTAGE OF UTERUS    . finger cyst removal    . neck cyst removal    . UPPER GASTROINTESTINAL ENDOSCOPY      Prior to Admission medications   Medication Sig Start Date End Date Taking? Authorizing Provider  calcium-vitamin D (OSCAL WITH D) 500-200 MG-UNIT per tablet Take 1 tablet by mouth 2 (two) times daily. 12/26/14   Forest Gleason, MD  citalopram (CELEXA) 20 MG tablet Take 2 tablets (40 mg total) by mouth  daily. Start taking 1.5 tablets for 2 weeks and increase to 2 tablets. 01/20/19   Ursula Alert, MD  enalapril (VASOTEC) 10 MG tablet Take 10 mg by mouth 2 (two) times daily.    [provider]  hydrochlorothiazide (HYDRODIURIL) 25 MG tablet Take 25 mg by mouth daily.    [provider]  HYDROcodone-acetaminophen (NORCO) 5-325 MG tablet Take 1 tablet by mouth every 6 (six) hours as needed for up to 15 doses for severe pain. Patient not taking: Reported on 12/03/2017 06/27/17   Darel Hong, MD  indomethacin (INDOCIN) 25 MG capsule Take 25 mg by mouth 3 (three) times daily as needed.    [provider]  LORazepam (ATIVAN) 0.5 MG tablet Take 1 tablet (0.5 mg total) by mouth 2 (two) times daily. 07/03/15   Forest Gleason, MD  metFORMIN (GLUCOPHAGE) 500 MG tablet Take 500 mg by mouth daily with breakfast.    [provider]  metoprolol succinate (TOPROL-XL) 25 MG 24 hr tablet Take 1 tablet (25 mg total) by mouth daily. 07/04/15   Forest Gleason, MD  metoprolol succinate (TOPROL-XL) 50 MG 24 hr tablet Take by mouth. 02/08/19 02/08/20  [provider]  omeprazole (PRILOSEC) 20 MG capsule Take 1 capsule (20 mg total) by mouth daily. 07/04/15   Forest Gleason, MD  QUEtiapine (SEROQUEL) 25 MG tablet Take 1.5 tablets (37.5 mg total) by mouth at bedtime. 02/11/19   Ursula Alert, MD  Red Yeast Rice 600 MG CAPS Take by mouth.    [provider]  vitamin B-12 (CYANOCOBALAMIN) 1000 MCG tablet Take 1 tablet (1,000 mcg total) by mouth daily. 07/04/15   Forest Gleason, MD    Allergies Avapro [irbesartan], Azithromycin, Buspirone, Clindamycin, Duloxetine, Duloxetine hcl, Ezetimibe-simvastatin, Lipitor [atorvastatin], Metronidazole, Penicillins, Prednisone, Procaine, Procaine hcl, Eggs or egg-derived products, Other, Statins, Sulfa antibiotics, and Tetracycline  Family History  Problem Relation Age of Onset  . Colon cancer Father   . Thyroid disease Father   .  Thyroid disease Mother   . Hypertension Mother   . Alzheimer's disease Mother   . Colon cancer Maternal Grandmother   . Breast cancer Maternal Grandmother   . Anxiety disorder Daughter     Social History Social History   Tobacco Use  . Smoking status: Never Smoker  . Smokeless tobacco: Never Used  Substance Use Topics  . Alcohol use: Yes    Comment: occasional  . Drug use: No    Review of Systems  Constitutional: No fever/chills Eyes: No visual changes. ENT: No sore throat. Cardiovascular: Denies chest pain. Respiratory: Denies shortness of breath. Gastrointestinal: No abdominal pain.  No nausea, no vomiting.  No diarrhea.  No constipation. Genitourinary: Negative for dysuria. Musculoskeletal: Negative for back pain. Skin: Negative for rash. Neurological: Negative for headaches, focal weakness   ____________________________________________   PHYSICAL EXAM:  VITAL SIGNS: ED Triage Vitals  Enc Vitals Group  BP 03/06/19 1932 129/64     Pulse Rate 03/06/19 1932 (!) 54     Resp 03/06/19 1932 (!) 23     Temp 03/06/19 1932 98.9 F (37.2 C)     Temp Source 03/06/19 1932 Oral     SpO2 03/06/19 1932 98 %     Weight 03/06/19 1926 167 lb (75.8 kg)     Height 03/06/19 1926 5\' 4"  (1.626 m)     Head Circumference --      Peak Flow --      Pain Score 03/06/19 1926 0     Pain Loc --      Pain Edu? --      Excl. in [place]? --     Constitutional: Sleepy but arousable otherwise looks well Eyes: Conjunctivae are normal.  Head: Atraumatic. Nose: No congestion/rhinnorhea. Mouth/Throat: Mucous membranes are moist.  Oropharynx non-erythematous. Neck: No stridor. Cardiovascular: Normal rate, regular rhythm. Grossly normal heart sounds.  Good peripheral circulation. Respiratory: Normal respiratory effort.  No retractions. Lungs CTAB. Gastrointestinal: Soft and nontender. No distention. No abdominal bruits. No CVA tenderness. Musculoskeletal: No lower extremity tenderness nor  edema.  Neurologic:  Normal speech and language. No gross focal neurologic deficits are appreciated. Skin:  Skin is warm, dry and intact. No rash noted.   ____________________________________________   LABS (all labs ordered are listed, but only abnormal results are displayed)  Labs Reviewed  COMPREHENSIVE METABOLIC PANEL - Abnormal; Notable for the following components:      Result Value   Glucose, Bld 118 (*)    BUN 32 (*)    Creatinine, Ser 1.24 (*)    Total Protein 5.9 (*)    Albumin 3.3 (*)    GFR calc non Af Amer 40 (*)    GFR calc Af Amer 46 (*)    All other components within normal limits  ACETAMINOPHEN LEVEL - Abnormal; Notable for the following components:   Acetaminophen (Tylenol), Serum <10 (*)    All other components within normal limits  URINALYSIS, COMPLETE (UACMP) WITH MICROSCOPIC - Abnormal; Notable for the following components:   Color, Urine YELLOW (*)    APPearance HAZY (*)    All other components within normal limits  URINE DRUG SCREEN, QUALITATIVE ([facility] ONLY) - Abnormal; Notable for the following components:   Tricyclic, Ur Screen POSITIVE (*)    Benzodiazepine, Ur Scrn POSITIVE (*)    All other components within normal limits  ETHANOL  SALICYLATE LEVEL  CBC WITH DIFFERENTIAL/PLATELET   ____________________________________________  EKG EKG read interpreted by me shows sinus bradycardia rate of 56 normal axis QTc interval is 493.  QRS is 87 ms  ____________________________________________  RADIOLOGY  ED MD interpretation:    Official radiology report(s): No results found.  ____________________________________________   PROCEDURES  Procedure(s) performed (including Critical Care):  Procedures   ____________________________________________   INITIAL IMPRESSION / ASSESSMENT AND PLAN / ED COURSE Discussed with poison control.  They are most worried about the citalopram.  They recommend every 4 hour EKGs to check for QT C and QRS  problems.  Recommend 24 hours of monitoring.  Also have to monitor the blood pressure in case of overdose with her antihypertensive medications that she has.               ____________________________________________   FINAL CLINICAL IMPRESSION(S) / ED DIAGNOSES  Final diagnoses:  Intentional drug overdose, initial encounter [facility])     ED Discharge Orders    None  Note:  This document was prepared using Dragon voice recognition software and may include unintentional dictation errors.    Nena Polio, MD 03/06/19 2118

## 2019-03-06 NOTE — ED Notes (Addendum)
Pt spoke with daughter on phone. Daughter is going to call in the am to find out room pt goes to.    [name] (daughter): [phone]  Changed BP to automatically take every 15 mins per admitting Dr.

## 2019-03-06 NOTE — ED Notes (Signed)
Pt states that medications were taken around 1800 and consisted of Lorazepam, melatonin, and one of her "yellow pills".

## 2019-03-06 NOTE — ED Triage Notes (Signed)
Pt arrives via [facility] with c/o overdose with SI intent. Pt is lethargic at this time in triage but is able to answer questions. Pt verbally stated to this RN that she will not attempt SI while in our care.

## 2019-03-07 DIAGNOSIS — Z8349 Family history of other endocrine, nutritional and metabolic diseases: Secondary | ICD-10-CM | POA: Diagnosis not present

## 2019-03-07 DIAGNOSIS — Z7984 Long term (current) use of oral hypoglycemic drugs: Secondary | ICD-10-CM | POA: Diagnosis not present

## 2019-03-07 DIAGNOSIS — Z8249 Family history of ischemic heart disease and other diseases of the circulatory system: Secondary | ICD-10-CM | POA: Diagnosis not present

## 2019-03-07 DIAGNOSIS — E119 Type 2 diabetes mellitus without complications: Secondary | ICD-10-CM | POA: Diagnosis present

## 2019-03-07 DIAGNOSIS — Z20828 Contact with and (suspected) exposure to other viral communicable diseases: Secondary | ICD-10-CM | POA: Diagnosis present

## 2019-03-07 DIAGNOSIS — Z853 Personal history of malignant neoplasm of breast: Secondary | ICD-10-CM | POA: Diagnosis not present

## 2019-03-07 DIAGNOSIS — F332 Major depressive disorder, recurrent severe without psychotic features: Secondary | ICD-10-CM | POA: Diagnosis present

## 2019-03-07 DIAGNOSIS — I959 Hypotension, unspecified: Secondary | ICD-10-CM | POA: Diagnosis present

## 2019-03-07 DIAGNOSIS — N179 Acute kidney failure, unspecified: Secondary | ICD-10-CM | POA: Diagnosis present

## 2019-03-07 DIAGNOSIS — E785 Hyperlipidemia, unspecified: Secondary | ICD-10-CM | POA: Diagnosis present

## 2019-03-07 DIAGNOSIS — K219 Gastro-esophageal reflux disease without esophagitis: Secondary | ICD-10-CM | POA: Diagnosis present

## 2019-03-07 DIAGNOSIS — R45851 Suicidal ideations: Secondary | ICD-10-CM

## 2019-03-07 DIAGNOSIS — Z800 Family history of malignant neoplasm of digestive organs: Secondary | ICD-10-CM | POA: Diagnosis not present

## 2019-03-07 DIAGNOSIS — T50902A Poisoning by unspecified drugs, medicaments and biological substances, intentional self-harm, initial encounter: Secondary | ICD-10-CM

## 2019-03-07 DIAGNOSIS — F411 Generalized anxiety disorder: Secondary | ICD-10-CM | POA: Diagnosis not present

## 2019-03-07 DIAGNOSIS — F410 Panic disorder [episodic paroxysmal anxiety] without agoraphobia: Secondary | ICD-10-CM | POA: Diagnosis present

## 2019-03-07 DIAGNOSIS — Z803 Family history of malignant neoplasm of breast: Secondary | ICD-10-CM | POA: Diagnosis not present

## 2019-03-07 DIAGNOSIS — Z79891 Long term (current) use of opiate analgesic: Secondary | ICD-10-CM | POA: Diagnosis not present

## 2019-03-07 DIAGNOSIS — Z820 Family history of epilepsy and other diseases of the nervous system: Secondary | ICD-10-CM | POA: Diagnosis not present

## 2019-03-07 DIAGNOSIS — Z79899 Other long term (current) drug therapy: Secondary | ICD-10-CM | POA: Diagnosis not present

## 2019-03-07 DIAGNOSIS — Z915 Personal history of self-harm: Secondary | ICD-10-CM | POA: Diagnosis not present

## 2019-03-07 DIAGNOSIS — I10 Essential (primary) hypertension: Secondary | ICD-10-CM | POA: Diagnosis present

## 2019-03-07 DIAGNOSIS — T43222A Poisoning by selective serotonin reuptake inhibitors, intentional self-harm, initial encounter: Secondary | ICD-10-CM | POA: Diagnosis not present

## 2019-03-07 LAB — BASIC METABOLIC PANEL
Anion gap: 7 (ref 5–15)
BUN: 28 mg/dL — ABNORMAL HIGH (ref 8–23)
CO2: 23 mmol/L (ref 22–32)
Calcium: 9 mg/dL (ref 8.9–10.3)
Chloride: 109 mmol/L (ref 98–111)
Creatinine, Ser: 1.13 mg/dL — ABNORMAL HIGH (ref 0.44–1.00)
GFR calc Af Amer: 52 mL/min — ABNORMAL LOW (ref >60)
GFR calc non Af Amer: 45 mL/min — ABNORMAL LOW (ref >60)
Glucose, Bld: 100 mg/dL — ABNORMAL HIGH (ref 70–99)
Potassium: 4.3 mmol/L (ref 3.5–5.1)
Sodium: 139 mmol/L (ref 135–145)

## 2019-03-07 LAB — SALICYLATE LEVEL: Salicylate Lvl: <7 mg/dL (ref 2.8–30.0)

## 2019-03-07 LAB — CBC
HCT: 37.2 % (ref 36.0–46.0)
Hemoglobin: 12.2 g/dL (ref 12.0–15.0)
MCH: 31.4 pg (ref 26.0–34.0)
MCHC: 32.8 g/dL (ref 30.0–36.0)
MCV: 95.6 fL (ref 80.0–100.0)
Platelets: 212 10*3/uL (ref 150–400)
RBC: 3.89 MIL/uL (ref 3.87–5.11)
RDW: 14.3 % (ref 11.5–15.5)
WBC: 6.5 10*3/uL (ref 4.0–10.5)
nRBC: 0 % (ref 0.0–0.2)

## 2019-03-07 LAB — HEMOGLOBIN A1C
Hgb A1c MFr Bld: 6 % — ABNORMAL HIGH (ref 4.8–5.6)
Mean Plasma Glucose: 125.5 mg/dL

## 2019-03-07 LAB — GLUCOSE, CAPILLARY
Glucose-Capillary: 125 mg/dL — ABNORMAL HIGH (ref 70–99)
Glucose-Capillary: 90 mg/dL (ref 70–99)
Glucose-Capillary: 103 mg/dL — ABNORMAL HIGH (ref 70–99)

## 2019-03-07 LAB — PROTIME-INR
INR: 1 (ref 0.8–1.2)
Prothrombin Time: 12.8 seconds (ref 11.4–15.2)

## 2019-03-07 LAB — SARS CORONAVIRUS 2 (TAT 6-24 HRS): SARS Coronavirus 2: NEGATIVE

## 2019-03-07 LAB — ACETAMINOPHEN LEVEL: Acetaminophen (Tylenol), Serum: <10 ug/mL — ABNORMAL LOW (ref 10–30)

## 2019-03-07 LAB — TSH: TSH: 2.517 u[IU]/mL (ref 0.350–4.500)

## 2019-03-07 MED ORDER — VITAMIN B-12 1000 MCG PO TABS
1000.0000 ug | ORAL_TABLET | Freq: Every day | ORAL | Status: DC
  Administered 2019-03-07 – 2019-03-11 (×5): 1000 ug via ORAL
  Filled 2019-03-07 (×5): qty 1

## 2019-03-07 MED ORDER — SODIUM CHLORIDE 0.9 % IV SOLN
INTRAVENOUS | Status: DC
  Administered 2019-03-07 – 2019-03-11 (×4): via INTRAVENOUS

## 2019-03-07 MED ORDER — ONDANSETRON HCL 4 MG/2ML IJ SOLN: 4.0000 mg | Freq: Four times a day (QID) | INTRAMUSCULAR | Status: DC | PRN

## 2019-03-07 MED ORDER — SODIUM CHLORIDE 0.9 % IV BOLUS
1000.0000 mL | Freq: Once | INTRAVENOUS | Status: AC
  Administered 2019-03-07: 1000 mL via INTRAVENOUS

## 2019-03-07 MED ORDER — CITALOPRAM HYDROBROMIDE 20 MG PO TABS: 40.0000 mg | ORAL_TABLET | Freq: Every day | ORAL | Status: DC

## 2019-03-07 MED ORDER — METOPROLOL SUCCINATE ER 50 MG PO TB24
50.0000 mg | ORAL_TABLET | Freq: Every day | ORAL | Status: DC
  Administered 2019-03-07 – 2019-03-11 (×5): 50 mg via ORAL
  Filled 2019-03-07 (×5): qty 1

## 2019-03-07 MED ORDER — ONDANSETRON HCL 4 MG PO TABS: 4.0000 mg | ORAL_TABLET | Freq: Four times a day (QID) | ORAL | Status: DC | PRN

## 2019-03-07 MED ORDER — INSULIN ASPART 100 UNIT/ML ~~LOC~~ SOLN
0.0000 [IU] | Freq: Three times a day (TID) | SUBCUTANEOUS | Status: DC
  Administered 2019-03-07: 1 [IU] via SUBCUTANEOUS
  Administered 2019-03-08: 2 [IU] via SUBCUTANEOUS
  Administered 2019-03-09 – 2019-03-10 (×2): 1 [IU] via SUBCUTANEOUS
  Administered 2019-03-11: 2 [IU] via SUBCUTANEOUS
  Filled 2019-03-07 (×5): qty 1

## 2019-03-07 MED ORDER — LORAZEPAM 0.5 MG PO TABS: 0.5000 mg | ORAL_TABLET | Freq: Two times a day (BID) | ORAL | Status: DC

## 2019-03-07 MED ORDER — PANTOPRAZOLE SODIUM 40 MG PO TBEC
40.0000 mg | DELAYED_RELEASE_TABLET | Freq: Every day | ORAL | Status: DC
  Administered 2019-03-07 – 2019-03-11 (×5): 40 mg via ORAL
  Filled 2019-03-07 (×5): qty 1

## 2019-03-07 MED ORDER — CALCIUM CARBONATE-VITAMIN D 500-200 MG-UNIT PO TABS
1.0000 | ORAL_TABLET | Freq: Two times a day (BID) | ORAL | Status: DC
  Administered 2019-03-07 – 2019-03-11 (×8): 1 via ORAL
  Filled 2019-03-07 (×8): qty 1

## 2019-03-07 MED ORDER — QUETIAPINE FUMARATE 25 MG PO TABS: 37.5000 mg | ORAL_TABLET | Freq: Every day | ORAL | Status: DC

## 2019-03-07 MED ORDER — ENOXAPARIN SODIUM 40 MG/0.4ML ~~LOC~~ SOLN
40.0000 mg | SUBCUTANEOUS | Status: DC
  Administered 2019-03-07 – 2019-03-10 (×4): 40 mg via SUBCUTANEOUS
  Filled 2019-03-07 (×5): qty 0.4

## 2019-03-07 MED ORDER — DOCUSATE SODIUM 100 MG PO CAPS: 100.0000 mg | ORAL_CAPSULE | Freq: Every day | ORAL | Status: DC | PRN

## 2019-03-07 MED ORDER — INSULIN ASPART 100 UNIT/ML ~~LOC~~ SOLN: 0.0000 [IU] | Freq: Every day | SUBCUTANEOUS | Status: DC

## 2019-03-07 MED ORDER — POLYETHYLENE GLYCOL 3350 17 G PO PACK
17.0000 g | PACK | Freq: Every day | ORAL | Status: DC
  Administered 2019-03-07 – 2019-03-11 (×4): 17 g via ORAL
  Filled 2019-03-07 (×4): qty 1

## 2019-03-07 MED ORDER — ENALAPRIL MALEATE 10 MG PO TABS
10.0000 mg | ORAL_TABLET | Freq: Two times a day (BID) | ORAL | Status: DC
  Administered 2019-03-07 – 2019-03-11 (×8): 10 mg via ORAL
  Filled 2019-03-07 (×10): qty 1

## 2019-03-07 NOTE — Progress Notes (Signed)
   03/07/19 1210  Clinical Encounter Type  Visited With Patient  Visit Type Initial  Referral From Nurse  Consult/Referral To Chaplain  Spiritual Encounters  Spiritual Needs Prayer;Emotional  [place] entered room and patient was resting in hospital bed with several blankets. Woke up to verbal stimuli. Patient shared feelings and emotions freely. [place] provided pastoral care through validation of emotions, normalizing feelings and being a hopeful presence. CH provided prayer upon request. Pastoral visit was appreciated. No further actions needed at this time.

## 2019-03-07 NOTE — ED Notes (Signed)
Awaiting escort for IVC patient.

## 2019-03-07 NOTE — Progress Notes (Signed)
[facility] at [facility] NAME: Paige Dougherty    MR#:  [id number]  DATE OF BIRTH:  May 31, 1933  SUBJECTIVE:  CHIEF COMPLAINT: Patient is awake and alert and answering most of the questions appropriately.  She lives with her husband and daughter comes and checks on her intermittently.  Denies any physical or verbal abuse.  Feeling depressed.  Poor p.o. intake.  Sitter at bedside  REVIEW OF SYSTEMS:  CONSTITUTIONAL: No fever, fatigue or weakness.  EYES: No blurred or double vision.  EARS, NOSE, AND THROAT: No tinnitus or ear pain.  RESPIRATORY: No cough, shortness of breath, wheezing or hemoptysis.  CARDIOVASCULAR: No chest pain, orthopnea, edema.  GASTROINTESTINAL: No nausea, vomiting, diarrhea or abdominal pain.  GENITOURINARY: No dysuria, hematuria.  ENDOCRINE: No polyuria, nocturia,  HEMATOLOGY: No anemia, easy bruising or bleeding SKIN: No rash or lesion. MUSCULOSKELETAL: No joint pain or arthritis.   NEUROLOGIC: No tingling, numbness, weakness.  PSYCHIATRY: Endorses depression  DRUG ALLERGIES:   Allergies  Allergen Reactions  . Avapro [Irbesartan] Other (See Comments)    Other reaction(s): Unknown unknown  . Azithromycin Other (See Comments)    Extreme vaginal burning. unknown  . Buspirone Other (See Comments)    Burning sensations and felt overly hot   . Clindamycin Other (See Comments)    Vaginal burning  . Duloxetine Other (See Comments)    Hyperactivity.  . Duloxetine Hcl Other (See Comments)    Hyperactivity.  . Ezetimibe-Simvastatin Other (See Comments)    Arthralgias.  . Lipitor [Atorvastatin] Other (See Comments)    "muscle aches"  . Metronidazole Other (See Comments)  . Penicillins   . Prednisone Other (See Comments)    Dizziness/double vision  . Procaine Other (See Comments)    tremors  . Procaine Hcl     tremors  . Eggs Or Egg-Derived Products Other (See Comments)    Other Reaction: Not Assessed  . Other  Other (See Comments) and Anxiety    Novacaine weakness  . Statins Rash    Arthralgias.  . Sulfa Antibiotics Rash    Vague history of a sulfa allergy, but does not remember the reaction.  . Tetracycline Rash    VITALS:  Blood pressure (!) 154/73, pulse (!) 53, temperature 97.6 F (36.4 C), temperature source Oral, resp. rate 16, height 5\' 4"  (1.626 m), weight 75.8 kg, SpO2 99 %.  PHYSICAL EXAMINATION:  GENERAL:  85 y.o.-year-old patient lying in the bed with no acute distress.  EYES: Pupils equal, round, reactive to light and accommodation. No scleral icterus. Extraocular muscles intact.  HEENT: Head atraumatic, normocephalic. Oropharynx and nasopharynx clear.  NECK:  Supple, no jugular venous distention. No thyroid enlargement, no tenderness.  LUNGS: Normal breath sounds bilaterally, no wheezing, rales,rhonchi or crepitation. No use of accessory muscles of respiration.  CARDIOVASCULAR: S1, S2 normal. No murmurs, rubs, or gallops.  ABDOMEN: Soft, nontender, nondistended. Bowel sounds present. No organomegaly or mass.  EXTREMITIES: No pedal edema, cyanosis, or clubbing.  NEUROLOGIC: Cranial nerves II through XII are intact. Muscle strength at her baseline in all extremities. Sensation intact. Gait not checked.  PSYCHIATRIC: The patient is alert and oriented x 3.  SKIN: No obvious rash, lesion, or ulcer.    LABORATORY PANEL:   CBC Recent Labs  Lab 03/07/19 1003  WBC 6.5  HGB 12.2  HCT 37.2  PLT 212   ------------------------------------------------------------------------------------------------------------------  Chemistries  Recent Labs  Lab 03/06/19 1934 03/07/19 1003  NA 138 139  K 4.1 4.3  CL 108 109  CO2 24 23  GLUCOSE 118* 100*  BUN 32* 28*  CREATININE 1.24* 1.13*  CALCIUM 9.4 9.0  AST 19  --   ALT 18  --   ALKPHOS 67  --   BILITOT 0.7  --     ------------------------------------------------------------------------------------------------------------------  Cardiac Enzymes No results for input(s): TROPONINI in the last 168 hours. ------------------------------------------------------------------------------------------------------------------  RADIOLOGY:  No results found.  EKG:   Orders placed or performed during the hospital encounter of 03/06/19  . ED EKG  . ED EKG  . EKG 12-Lead  . EKG 12-Lead  . EKG 12-Lead  . EKG 12-Lead  . EKG 12-Lead  . EKG 12-Lead  . EKG 12-Lead  . EKG 12-Lead  . EKG 12-Lead  . EKG 12-Lead    ASSESSMENT AND PLAN:   1.  Drug overdose-intentional - Poison control contacted by the ED physician, patient is clinically improving - We will continue with recommended every 4 hour EKG -Telemetry monitoring - Psychiatric services consulted by the ED physician, psych consult is pending at this point of time -IVC, one-on-one observation by sitter  2.  AKI --Likely secondary to medication overdose - Patient received normal saline bolus in the emergency room currently with normal saline infusing to peripheral IV at 100 cc/h, will continue IV fluids at the current rate - We will repeat BMP in the a.m. and continue to monitor renal function closely for improvement with gentle rehydration  3.  Diabetes mellitus - Moderate sliding scale insulin  4.    Essential hypertension Resume home medications as patient is not hypotensive anymore  DVT and PPI prophylaxis     All the records are reviewed and case discussed with Care Management/Social Workerr. Management plans discussed with the patient, called  daughter [phone] -left a vm to call back for an update   CODE STATUS: FC   TOTAL TIME TAKING CARE OF THIS PATIENT: 36  minutes.   POSSIBLE D/C IN 2 DAYS, DEPENDING ON CLINICAL CONDITION.  Note: This dictation was prepared with Dragon dictation along with smaller phrase  technology. Any transcriptional errors that result from this process are unintentional.   Nicholes Mango M.D on 03/07/2019 at 2:43 PM  Between 7am to 6pm - Pager - [phone] After 6pm go to [email or web address] - password [email or web address] [facility]  Office  [phone]  CC: Primary care physician; [place]

## 2019-03-07 NOTE — ED Notes (Signed)
ED TO INPATIENT HANDOFF REPORT  ED Nurse Name and Phone #:  Quillian Quince [phone]  S Name/Age/Gender [name] 85 y.o. female Room/Bed: ED24A/ED24A  Code Status   Code Status: Prior  Home/SNF/Other Home Patient oriented to: self, place, time and situation Is this baseline? Yes   Triage Complete: Triage complete  Chief Complaint drug overdose  Triage Note Pt arrives via [facility] with c/o overdose with SI intent. Pt is lethargic at this time in triage but is able to answer questions. Pt verbally stated to this RN that she will not attempt SI while in our care.    Allergies Allergies  Allergen Reactions  . Avapro [Irbesartan] Other (See Comments)    Other reaction(s): Unknown unknown  . Azithromycin Other (See Comments)    Extreme vaginal burning. unknown  . Buspirone Other (See Comments)    Burning sensations and felt overly hot   . Clindamycin Other (See Comments)    Vaginal burning  . Duloxetine Other (See Comments)    Hyperactivity.  . Duloxetine Hcl Other (See Comments)    Hyperactivity.  . Ezetimibe-Simvastatin Other (See Comments)    Arthralgias.  . Lipitor [Atorvastatin] Other (See Comments)    "muscle aches"  . Metronidazole Other (See Comments)  . Penicillins   . Prednisone Other (See Comments)    Dizziness/double vision  . Procaine Other (See Comments)    tremors  . Procaine Hcl     tremors  . Eggs Or Egg-Derived Products Other (See Comments)    Other Reaction: Not Assessed  . Other Other (See Comments) and Anxiety    Novacaine weakness  . Statins Rash    Arthralgias.  . Sulfa Antibiotics Rash    Vague history of a sulfa allergy, but does not remember the reaction.  . Tetracycline Rash    Level of Care/Admitting Diagnosis ED Disposition    ED Disposition Condition [facility]: [place] [100120]  Level of Care: Med-Surg [16]  Covid Evaluation: Asymptomatic Screening Protocol (No Symptoms)  Diagnosis: Overdose [509326]  Admitting Physician: Mayer Camel [[id number]]  Attending Physician: Mayer Camel [[id number]]  Estimated length of stay: past midnight tomorrow  Certification:: I certify this patient will need inpatient services for at least 2 midnights  PT Class (Do Not Modify): Inpatient [101]  PT Acc Code (Do Not Modify): Private [1]       B Medical/Surgery History Past Medical History:  Diagnosis Date  . Allergy   . Anxiety   . Arthritis    HANDS/FEET  . Barrett's esophagus   . Breast cancer ([place]) 2016   RIGHT lumpectomy 2016 INVASIVE MAMMARY CARCINOMA   . Cancer of breast ([place]) 12/24/2014   radiation- Right  . Chronic headache   . Depression   . Diabetes mellitus   . Diverticulosis   . Esophageal stricture   . Fibrocystic breast disease   . GERD (gastroesophageal reflux disease)   . Hyperlipemia   . Hypertension   . IBS (irritable bowel syndrome)   . Personal history of radiation therapy 2016   RIGHT lumpectomy    INVASIVE MAMMARY CARCINOMA   . Urinary incontinence    Past Surgical History:  Procedure Laterality Date  . ABDOMINAL HYSTERECTOMY    . BREAST BIOPSY Bilateral    core bxs  . BREAST EXCISIONAL BIOPSY Right 2016   +  . BREAST LUMPECTOMY Right 2016   INVASIVE MAMMARY CARCINOMA   . CARPAL TUNNEL RELEASE  bilateral  . CATARACT EXTRACTION     bilateral   . COLONOSCOPY    . DILATION AND CURETTAGE OF UTERUS    . finger cyst removal    . neck cyst removal    . UPPER GASTROINTESTINAL ENDOSCOPY       A IV Location/Drains/Wounds Patient Lines/Drains/Airways Status   Active Line/Drains/Airways    Name:   Placement date:   Placement time:   Site:   Days:   Peripheral IV 03/06/19 Left Antecubital   03/06/19    1915    Antecubital   1          Intake/Output Last 24 hours No intake or output data in the 24 hours ending 03/07/19 0136  Labs/Imaging Results for orders placed or performed during the hospital encounter of 03/06/19  (from the past 48 hour(s))  Comprehensive metabolic panel     Status: Abnormal   Collection Time: 03/06/19  7:34 PM  Result Value Ref Range   Sodium 138 135 - 145 mmol/L   Potassium 4.1 3.5 - 5.1 mmol/L   Chloride 108 98 - 111 mmol/L   CO2 24 22 - 32 mmol/L   Glucose, Bld 118 (H) 70 - 99 mg/dL   BUN 32 (H) 8 - 23 mg/dL   Creatinine, Ser 1.24 (H) 0.44 - 1.00 mg/dL   Calcium 9.4 8.9 - 10.3 mg/dL   Total Protein 5.9 (L) 6.5 - 8.1 g/dL   Albumin 3.3 (L) 3.5 - 5.0 g/dL   AST 19 15 - 41 U/L   ALT 18 0 - 44 U/L   Alkaline Phosphatase 67 38 - 126 U/L   Total Bilirubin 0.7 0.3 - 1.2 mg/dL   GFR calc non Af Amer 40 (L) >60 mL/min   GFR calc Af Amer 46 (L) >60 mL/min   Anion gap 6 5 - 15    Comment: Performed at [facility], [address] [place], [place] [address]  Acetaminophen level     Status: Abnormal   Collection Time: 03/06/19  7:34 PM  Result Value Ref Range   Acetaminophen (Tylenol), Serum <10 (L) 10 - 30 ug/mL    Comment: (NOTE) Therapeutic concentrations vary significantly. A range of 10-30 ug/mL  may be an effective concentration for many patients. However, some  are best treated at concentrations outside of this range. Acetaminophen concentrations >150 ug/mL at 4 hours after ingestion  and >50 ug/mL at 12 hours after ingestion are often associated with  toxic reactions. Performed at [facility], [address] [place], [place] [address]   Ethanol     Status: None   Collection Time: 03/06/19  7:34 PM  Result Value Ref Range   Alcohol, Ethyl (B) <10 <10 mg/dL    Comment: (NOTE) Lowest detectable limit for serum alcohol is 10 mg/dL. For medical purposes only. Performed at [facility], [address] [place], [place] [address]   Salicylate level     Status: None   Collection Time: 03/06/19  7:34 PM  Result Value Ref Range   Salicylate Lvl <2.4 2.8 - 30.0 mg/dL    Comment: Performed at [facility], [address]  [place], [place] [address]  CBC with Differential     Status: None   Collection Time: 03/06/19  7:34 PM  Result Value Ref Range   WBC 6.4 4.0 - 10.5 K/uL   RBC 3.91 3.87 - 5.11 MIL/uL   Hemoglobin 12.1 12.0 - 15.0 g/dL   HCT 37.2 36.0 -  46.0 %   MCV 95.1 80.0 - 100.0 fL   MCH 30.9 26.0 - 34.0 pg   MCHC 32.5 30.0 - 36.0 g/dL   RDW 14.3 11.5 - 15.5 %   Platelets 214 150 - 400 K/uL   nRBC 0.0 0.0 - 0.2 %   Neutrophils Relative % 61 %   Neutro Abs 4.0 1.7 - 7.7 K/uL   Lymphocytes Relative 25 %   Lymphs Abs 1.6 0.7 - 4.0 K/uL   Monocytes Relative 10 %   Monocytes Absolute 0.6 0.1 - 1.0 K/uL   Eosinophils Relative 2 %   Eosinophils Absolute 0.1 0.0 - 0.5 K/uL   Basophils Relative 1 %   Basophils Absolute 0.0 0.0 - 0.1 K/uL   Immature Granulocytes 1 %   Abs Immature Granulocytes 0.04 0.00 - 0.07 K/uL    Comment: Performed at [facility], [address] [place], [place] [address]  Urinalysis, Complete w Microscopic     Status: Abnormal   Collection Time: 03/06/19  7:34 PM  Result Value Ref Range   Color, Urine YELLOW (A) YELLOW   APPearance HAZY (A) CLEAR   Specific Gravity, Urine 1.016 1.005 - 1.030   pH 5.0 5.0 - 8.0   Glucose, UA NEGATIVE NEGATIVE mg/dL   Hgb urine dipstick NEGATIVE NEGATIVE   Bilirubin Urine NEGATIVE NEGATIVE   Ketones, ur NEGATIVE NEGATIVE mg/dL   Protein, ur NEGATIVE NEGATIVE mg/dL   Nitrite NEGATIVE NEGATIVE   Leukocytes,Ua NEGATIVE NEGATIVE   RBC / HPF 0-5 0 - 5 RBC/hpf   WBC, UA 0-5 0 - 5 WBC/hpf   Bacteria, UA NONE SEEN NONE SEEN   Squamous Epithelial / LPF 0-5 0 - 5   Mucus PRESENT    Hyaline Casts, UA PRESENT     Comment: Performed at [facility], [address], [place], [place] [address]  Urine Drug Screen, Qualitative     Status: Abnormal   Collection Time: 03/06/19  7:34 PM  Result Value Ref Range   Tricyclic, Ur Screen POSITIVE (A) NONE DETECTED   Amphetamines, Ur Screen NONE DETECTED NONE DETECTED   MDMA (Ecstasy)Ur  Screen NONE DETECTED NONE DETECTED   Cocaine Metabolite,Ur [place] NONE DETECTED NONE DETECTED   Opiate, Ur Screen NONE DETECTED NONE DETECTED   Phencyclidine (PCP) Ur S NONE DETECTED NONE DETECTED   Cannabinoid 50 Ng, Ur [place] NONE DETECTED NONE DETECTED   Barbiturates, Ur Screen NONE DETECTED NONE DETECTED   Benzodiazepine, Ur Scrn POSITIVE (A) NONE DETECTED   Methadone Scn, Ur NONE DETECTED NONE DETECTED    Comment: (NOTE) Tricyclics + metabolites, urine    Cutoff 1000 ng/mL Amphetamines + metabolites, urine  Cutoff 1000 ng/mL MDMA (Ecstasy), urine              Cutoff 500 ng/mL Cocaine Metabolite, urine          Cutoff 300 ng/mL Opiate + metabolites, urine        Cutoff 300 ng/mL Phencyclidine (PCP), urine         Cutoff 25 ng/mL Cannabinoid, urine                 Cutoff 50 ng/mL Barbiturates + metabolites, urine  Cutoff 200 ng/mL Benzodiazepine, urine              Cutoff 200 ng/mL Methadone, urine                   Cutoff 300 ng/mL The urine drug screen provides only a  preliminary, unconfirmed analytical test result and should not be used for non-medical purposes. Clinical consideration and professional judgment should be applied to any positive drug screen result due to possible interfering substances. A more specific alternate chemical method must be used in order to obtain a confirmed analytical result. Gas chromatography / mass spectrometry (GC/MS) is the preferred confirmat ory method. Performed at [facility], [address] [place], [place] [address]   Salicylate level     Status: None   Collection Time: 03/07/19 12:06 AM  Result Value Ref Range   Salicylate Lvl <2.4 2.8 - 30.0 mg/dL    Comment: Performed at [facility] [address] [place], [place] [address]  Acetaminophen level     Status: Abnormal   Collection Time: 03/07/19 12:06 AM  Result Value Ref Range   Acetaminophen (Tylenol), Serum <10 (L) 10 - 30 ug/mL    Comment: (NOTE) Therapeutic  concentrations vary significantly. A range of 10-30 ug/mL  may be an effective concentration for many patients. However, some  are best treated at concentrations outside of this range. Acetaminophen concentrations >150 ug/mL at 4 hours after ingestion  and >50 ug/mL at 12 hours after ingestion are often associated with  toxic reactions. Performed at [facility], [address] [place], [place] [address]    No results found.  Pending Labs Unresulted Labs (From admission, onward)    Start     Ordered   03/06/19 2122  SARS CORONAVIRUS 2 Nasal Swab Aptima Multi Swab  (Asymptomatic Patients Labs)  Once,   STAT    Question Answer Comment  Is this test for diagnosis or screening Screening   Symptomatic for COVID-19 as defined by CDC No   Hospitalized for COVID-19 No   Admitted to ICU for COVID-19 No   Previously tested for COVID-19 No   Resident in a congregate (group) care setting No   Employed in healthcare setting No   Pregnant No      03/06/19 2121   Signed and Held  CBC  (enoxaparin (LOVENOX)    CrCl >/= 30 ml/min)  Once,   R    Comments: Baseline for enoxaparin therapy IF NOT ALREADY DRAWN.  Notify MD if PLT < 100 K.    Signed and Held   Signed and Held  Creatinine, serum  (enoxaparin (LOVENOX)    CrCl >/= 30 ml/min)  Once,   R    Comments: Baseline for enoxaparin therapy IF NOT ALREADY DRAWN.    Signed and Held   Signed and Held  Creatinine, serum  (enoxaparin (LOVENOX)    CrCl >/= 30 ml/min)  Weekly,   R    Comments: while on enoxaparin therapy    Signed and Held   Signed and Held  Basic metabolic panel  Tomorrow morning,   R     Signed and Held   Signed and Held  CBC  Tomorrow morning,   R     Signed and Held   Signed and Held  Protime-INR  Tomorrow morning,   R     Signed and Held   Signed and Held  TSH  Once,   R     Signed and Held          Vitals/Pain Today's Vitals   03/07/19 0000 03/07/19 0015 03/07/19 0030 03/07/19 0045  BP: (!) 93/43 (!)  129/44 (!) 122/50 (!) 124/53  Pulse: (!) 48 (!) 52 (!) 51 (!) 51  Resp: 19 19 18  17  Temp:      TempSrc:      SpO2: 95% 97% 97% 99%  Weight:      Height:      PainSc:        Isolation Precautions No active isolations  Medications Medications  sodium chloride 0.9 % bolus 1,000 mL (1,000 mLs Intravenous New Bag/Given 03/07/19 0010)    Mobility walks High fall risk   Focused Assessments Neuro Assessment Handoff:  Swallow screen pass? N/A         Neuro Assessment:   Neuro Checks:      Last Documented NIHSS Modified Score:   Has TPA been given? No If patient is a Neuro Trauma and patient is going to OR before floor call report to [place] nurse: [phone] or [phone]     R Recommendations: See Admitting Provider Note  Report given to:   Additional Notes:

## 2019-03-07 NOTE — Progress Notes (Signed)
Spoke with [name], daughter of patient for AM update. Daughter states she will try to call patient's room shortly.

## 2019-03-07 NOTE — H&P (Signed)
[place] [facility] [place] NAME: Paige Dougherty    MR#:  [id number]  DATE OF BIRTH:  07-08-1933  DATE OF ADMISSION:  03/06/2019  PRIMARY CARE PHYSICIAN: [place]   REQUESTING/REFERRING PHYSICIAN: Conni Slipper, MD  CHIEF COMPLAINT:   Chief Complaint  Patient presents with  . Drug Overdose    HISTORY OF PRESENT ILLNESS:  Paige Dougherty  is a 85 y.o. female with a known history of depression, breast cancer status post right lumpectomy in 2016, diabetes mellitus, GERD, hyperlipidemia, hypertension.  She presented to the emergency room with reported overdose around 6 PM which patient reports having taken known doses of Celexa, lorazepam, mirtazapine, and melatonin.  Poison control was notified by the ED physician with entire medication list discussed with the most concern regarding citalopram.  [place] recommends EKG every 4 hours with 24-hour cardiac monitoring before she is cleared.  While I am seeing the patient, she is lethargic however she is oriented to person place as well as time and situation.  Urine drug screen is positive for benzodiazepines and tricyclics.  EKG shows no significant abnormalities currently.  IVC was initiated by the ED physician as patient reports she was trying to kill herself.  She reports a history of depression with her husband having recent had a stroke.  Patient reports she has a daughter who lives nearby and 1 grandson.  Psychiatric services were consulted by the ED physician.  She has been admitted by the hospitalist service. PAST MEDICAL HISTORY:   Past Medical History:  Diagnosis Date  . Allergy   . Anxiety   . Arthritis    HANDS/FEET  . Barrett's esophagus   . Breast cancer ([place]) 2016   RIGHT lumpectomy 2016 INVASIVE MAMMARY CARCINOMA   . Cancer of breast ([place]) 12/24/2014   radiation- Right  . Chronic headache   . Depression   . Diabetes mellitus   . Diverticulosis   . Esophageal  stricture   . Fibrocystic breast disease   . GERD (gastroesophageal reflux disease)   . Hyperlipemia   . Hypertension   . IBS (irritable bowel syndrome)   . Personal history of radiation therapy 2016   RIGHT lumpectomy    INVASIVE MAMMARY CARCINOMA   . Urinary incontinence     PAST SURGICAL HISTORY:   Past Surgical History:  Procedure Laterality Date  . ABDOMINAL HYSTERECTOMY    . BREAST BIOPSY Bilateral    core bxs  . BREAST EXCISIONAL BIOPSY Right 2016   +  . BREAST LUMPECTOMY Right 2016   INVASIVE MAMMARY CARCINOMA   . CARPAL TUNNEL RELEASE     bilateral  . CATARACT EXTRACTION     bilateral   . COLONOSCOPY    . DILATION AND CURETTAGE OF UTERUS    . finger cyst removal    . neck cyst removal    . UPPER GASTROINTESTINAL ENDOSCOPY      SOCIAL HISTORY:   Social History   Tobacco Use  . Smoking status: Never Smoker  . Smokeless tobacco: Never Used  Substance Use Topics  . Alcohol use: Yes    Comment: occasional    FAMILY HISTORY:   Family History  Problem Relation Age of Onset  . Colon cancer Father   . Thyroid disease Father   . Thyroid disease Mother   . Hypertension Mother   . Alzheimer's disease Mother   . Colon cancer Maternal Grandmother   . Breast cancer Maternal Grandmother   .  Anxiety disorder Daughter     DRUG ALLERGIES:   Allergies  Allergen Reactions  . Avapro [Irbesartan] Other (See Comments)    Other reaction(s): Unknown unknown  . Azithromycin Other (See Comments)    Extreme vaginal burning. unknown  . Buspirone Other (See Comments)    Burning sensations and felt overly hot   . Clindamycin Other (See Comments)    Vaginal burning  . Duloxetine Other (See Comments)    Hyperactivity.  . Duloxetine Hcl Other (See Comments)    Hyperactivity.  . Ezetimibe-Simvastatin Other (See Comments)    Arthralgias.  . Lipitor [Atorvastatin] Other (See Comments)    "muscle aches"  . Metronidazole Other (See Comments)  . Penicillins   .  Prednisone Other (See Comments)    Dizziness/double vision  . Procaine Other (See Comments)    tremors  . Procaine Hcl     tremors  . Eggs Or Egg-Derived Products Other (See Comments)    Other Reaction: Not Assessed  . Other Other (See Comments) and Anxiety    Novacaine weakness  . Statins Rash    Arthralgias.  . Sulfa Antibiotics Rash    Vague history of a sulfa allergy, but does not remember the reaction.  . Tetracycline Rash    REVIEW OF SYSTEMS:   Review of Systems  Constitutional: Negative for chills, fever and malaise/fatigue.  HENT: Negative for congestion and sinus pain.   Eyes: Negative for blurred vision and double vision.  Respiratory: Negative for cough, hemoptysis and shortness of breath.   Cardiovascular: Negative for chest pain and palpitations.  Gastrointestinal: Negative for abdominal pain, nausea and vomiting.  Genitourinary: Negative for dysuria, flank pain and hematuria.  Musculoskeletal: Negative for falls, joint pain and myalgias.  Neurological: Negative for dizziness, focal weakness, weakness and headaches.  Psychiatric/Behavioral: Positive for depression and suicidal ideas.     MEDICATIONS AT HOME:   Prior to Admission medications   Medication Sig Start Date End Date Taking? Authorizing Provider  calcium-vitamin D (OSCAL WITH D) 500-200 MG-UNIT per tablet Take 1 tablet by mouth 2 (two) times daily. 12/26/14  Yes Choksi, Delorise Shiner, MD  citalopram (CELEXA) 20 MG tablet Take 2 tablets (40 mg total) by mouth daily. Start taking 1.5 tablets for 2 weeks and increase to 2 tablets. 01/20/19  Yes Ursula Alert, MD  enalapril (VASOTEC) 10 MG tablet Take 10 mg by mouth 2 (two) times daily.   Yes [provider]  hydrochlorothiazide (HYDRODIURIL) 25 MG tablet Take 25 mg by mouth daily.   Yes [provider]  indomethacin (INDOCIN) 25 MG capsule Take 25 mg by mouth 3 (three) times daily as needed.   Yes [provider]  LORazepam (ATIVAN)  0.5 MG tablet Take 1 tablet (0.5 mg total) by mouth 2 (two) times daily. 07/03/15  Yes Choksi, Delorise Shiner, MD  metFORMIN (GLUCOPHAGE) 500 MG tablet Take 500 mg by mouth daily with breakfast.   Yes [provider]  metoprolol succinate (TOPROL-XL) 50 MG 24 hr tablet Take by mouth. 02/08/19 02/08/20 Yes [provider]  omeprazole (PRILOSEC) 20 MG capsule Take 1 capsule (20 mg total) by mouth daily. 07/04/15  Yes Choksi, Delorise Shiner, MD  QUEtiapine (SEROQUEL) 25 MG tablet Take 1.5 tablets (37.5 mg total) by mouth at bedtime. 02/11/19  Yes Ursula Alert, MD  Red Yeast Rice 600 MG CAPS Take by mouth.   Yes [provider]  vitamin B-12 (CYANOCOBALAMIN) 1000 MCG tablet Take 1 tablet (1,000 mcg total) by mouth daily. 07/04/15  Yes  Forest Gleason, MD  HYDROcodone-acetaminophen (NORCO) 5-325 MG tablet Take 1 tablet by mouth every 6 (six) hours as needed for up to 15 doses for severe pain. Patient not taking: Reported on 12/03/2017 06/27/17   Darel Hong, MD      VITAL SIGNS:  Blood pressure 126/60, pulse (!) 51, temperature 98.9 F (37.2 C), temperature source Oral, resp. rate 19, height 5\' 4"  (1.626 m), weight 75.8 kg, SpO2 95 %.  PHYSICAL EXAMINATION:  Physical Exam  GENERAL:  85 y.o.-year-old patient lying in the bed with no acute distress.  EYES: Pupils equal, round, reactive to light and accommodation. No scleral icterus. Extraocular muscles intact.  HEENT: Head atraumatic, normocephalic. Oropharynx and nasopharynx clear.  NECK:  Supple, no jugular venous distention. No thyroid enlargement, no tenderness.  LUNGS: Normal breath sounds bilaterally, no wheezing, rales,rhonchi or crepitation. No use of accessory muscles of respiration.  CARDIOVASCULAR: Regular rate and rhythm, S1, S2 normal. No murmurs, rubs, or gallops.  ABDOMEN: Soft, nondistended, nontender. Bowel sounds present. No organomegaly or mass.  EXTREMITIES: No pedal edema, cyanosis, or clubbing.  NEUROLOGIC: Cranial  nerves II through XII are intact. Muscle strength 5/5 in all extremities. Sensation intact. Gait not checked.  PSYCHIATRIC: Lethargic the patient is alert and oriented x 3.  Normal affect and good eye contact. SKIN: No obvious rash, lesion, or ulcer.   LABORATORY PANEL:   CBC Recent Labs  Lab 03/06/19 1934  WBC 6.4  HGB 12.1  HCT 37.2  PLT 214   ------------------------------------------------------------------------------------------------------------------  Chemistries  Recent Labs  Lab 03/06/19 1934  NA 138  K 4.1  CL 108  CO2 24  GLUCOSE 118*  BUN 32*  CREATININE 1.24*  CALCIUM 9.4  AST 19  ALT 18  ALKPHOS 67  BILITOT 0.7   ------------------------------------------------------------------------------------------------------------------  Cardiac Enzymes No results for input(s): TROPONINI in the last 168 hours. ------------------------------------------------------------------------------------------------------------------  RADIOLOGY:  No results found.    IMPRESSION AND PLAN:   1.  Drug overdose-intentional - Poison control contacted by the ED physician - We will continue with recommended every 4 hour EKG -Telemetry monitoring - Psychiatric services consulted by the ED physician  2.  AKI --Likely secondary to medication overdose - Patient received normal saline bolus in the emergency room currently with normal saline infusing to peripheral IV at 100 cc/h - We will repeat BMP in the a.m. and continue to monitor renal function closely for improvement with gentle rehydration  3.  Diabetes mellitus - Moderate sliding scale insulin  4.  History of hypertension - Hypotensive on arrival -We will continue to monitor and hold current home antihypertensives.  DVT and PPI prophylaxis   All the records are reviewed and case discussed with ED provider. The plan of care was discussed in details with the patient (and family). I answered all questions. The  patient agreed to proceed with the above mentioned plan. Further management will depend upon hospital course.   CODE STATUS: Full code  TOTAL TIME TAKING CARE OF THIS PATIENT: 45 minutes.    [place] on 03/07/2019 at 6:33 AM  Pager - [phone]  After 6pm go to [email or web address] - Proofreader  [facility]  Office  [phone]  CC: Primary care physician; [place]   Note: This dictation was prepared with Dragon dictation along with smaller phrase technology. Any transcriptional errors that result from this process are unintentional.

## 2019-03-07 NOTE — Progress Notes (Signed)
Pt states she has always had depression. Spent a week at [facility] and then another admission to [facility] psych unit. Can't remember how long ago. States outpatient visits with psychiatrist and inpatinet admissions have not helped her to date. Claiborne Billings [employer]

## 2019-03-07 NOTE — Progress Notes (Signed)
Patient has been resting with eyes closed intermittently but awakens with ease. Meds  taken without difficulty.   No c/o discomfort. Sitter remains at bedside for patient safety.

## 2019-03-07 NOTE — ED Notes (Signed)
Attempted to call report. Was told that floor needs to obtain sitters before taking patients.

## 2019-03-07 NOTE — Consult Note (Signed)
[place] Psychiatry Consult   Reason for Consult:  Overdose, intentional Referring Physician:  EDP Patient Identification: AIGNER HORSEMAN MRN:  [id number] Principal Diagnosis: Major depressive disorder, recurrent severe without psychotic features ([place]) Diagnosis:  Principal Problem:   Major depressive disorder, recurrent severe without psychotic features ([place]) Active Problems:   Overdose, intentional self-harm, initial encounter ([place])   Total Time spent with patient: 1 hour  Subjective:   Paige Dougherty is a 85 y.o. female patient admitted with intentional overdose.  Continues to endorse depression, hopelessness, and suicidal ideations.    03/08/19:  Patient seen and evaluated face-to-face in her hospital room.  Patient lying in bed with her eyes closed, reports she will answer a few questions and then will sleep.  Endorses depression and hopelessness as her finances are overwhelming.  Verbalizes she knows she needs to come inpatient for mental health care but "can't afford it."  She lives at home with her husband and a supportive daughter.  She has been going to her outpatient psychiatrist via telepsych and her daughter interprets as she cannot understand her.    HPI:  Per TTS on admission to the ED:  Paige Dougherty is an 85 y.o. female.  The pt came in after overdosing on 10-20 pills.  The pt stated, "Why can't I just die?  I thought it was the right time"  She stated she is worried about everything, such as her husbands health.  She has had a previous attempt about 10 years ago of overdosing.  She is is seeing a psychiatrist at [facility].  She lives with her husband.  The pt denies self harm, HI, legal issues, history of abuse and hallucinations.  She stated she is sleeping about 4 hours a night and has a fair appetite.  She reports feeling hopeless and having a hard time getting out of bed.  She denies SA and her UDS is negative for all substances except benzodiazepines, which  she is prescribed.  Past Psychiatric History: depression, anxiety  Risk to Self: Suicidal Ideation: Yes-Currently Present Suicidal Intent: Yes-Currently Present Is patient at risk for suicide?: Yes Suicidal Plan?: Yes-Currently Present Specify Current Suicidal Plan: OD on pills Access to Means: Yes Specify Access to Suicidal Means: has pills What has been your use of drugs/alcohol within the last 12 months?: none How many times?: 2 Other Self Harm Risks: none Triggers for Past Attempts: Unpredictable Intentional Self Injurious Behavior: None Risk to Others: Homicidal Ideation: No Thoughts of Harm to Others: No Current Homicidal Intent: No Current Homicidal Plan: No Access to Homicidal Means: No Identified Victim: pt denies History of harm to others?: No Assessment of Violence: None Noted Violent Behavior Description: none Does patient have access to weapons?: No Criminal Charges Pending?: No Does patient have a court date: No Prior Inpatient Therapy: Prior Inpatient Therapy: Yes Prior Therapy Dates: 2010 Prior Therapy Facilty/Provider(s): [facility] Reason for Treatment: SI Prior Outpatient Therapy: Prior Outpatient Therapy: Yes Prior Therapy Dates: current Prior Therapy Facilty/Provider(s): [facility] Reason for Treatment: depression Does patient have an ACCT team?: No Does patient have Intensive In-House Services?  : No Does patient have [facility] services? : No Does patient have P4CC services?: No  Past Medical History:  Past Medical History:  Diagnosis Date  . Allergy   . Anxiety   . Arthritis    HANDS/FEET  . Barrett's esophagus   . Breast cancer ([place]) 2016   RIGHT lumpectomy 2016 INVASIVE MAMMARY CARCINOMA   . Cancer  of breast ([place]) 12/24/2014   radiation- Right  . Chronic headache   . Depression   . Diabetes mellitus   . Diverticulosis   . Esophageal stricture   . Fibrocystic breast disease   . GERD (gastroesophageal reflux disease)   . Hyperlipemia    . Hypertension   . IBS (irritable bowel syndrome)   . Personal history of radiation therapy 2016   RIGHT lumpectomy    INVASIVE MAMMARY CARCINOMA   . Urinary incontinence     Past Surgical History:  Procedure Laterality Date  . ABDOMINAL HYSTERECTOMY    . BREAST BIOPSY Bilateral    core bxs  . BREAST EXCISIONAL BIOPSY Right 2016   +  . BREAST LUMPECTOMY Right 2016   INVASIVE MAMMARY CARCINOMA   . CARPAL TUNNEL RELEASE     bilateral  . CATARACT EXTRACTION     bilateral   . COLONOSCOPY    . DILATION AND CURETTAGE OF UTERUS    . finger cyst removal    . neck cyst removal    . UPPER GASTROINTESTINAL ENDOSCOPY     Family History:  Family History  Problem Relation Age of Onset  . Colon cancer Father   . Thyroid disease Father   . Thyroid disease Mother   . Hypertension Mother   . Alzheimer's disease Mother   . Colon cancer Maternal Grandmother   . Breast cancer Maternal Grandmother   . Anxiety disorder Daughter    Family Psychiatric  History: see above Social History:  Social History   Substance and Sexual Activity  Alcohol Use Yes   Comment: occasional     Social History   Substance and Sexual Activity  Drug Use No    Social History   Socioeconomic History  . Marital status: Married    Spouse name: Not on file  . Number of children: Not on file  . Years of education: Not on file  . Highest education level: Not on file  Occupational History  . Not on file  Social Needs  . Financial resource strain: Not on file  . Food insecurity    Worry: Not on file    Inability: Not on file  . Transportation needs    Medical: Not on file    Non-medical: Not on file  Tobacco Use  . Smoking status: Never Smoker  . Smokeless tobacco: Never Used  Substance and Sexual Activity  . Alcohol use: Yes    Comment: occasional  . Drug use: No  . Sexual activity: Not on file  Lifestyle  . Physical activity    Days per week: Not on file    Minutes per session: Not on  file  . Stress: Not on file  Relationships  . Social Herbalist on phone: Not on file    Gets together: Not on file    Attends religious service: Not on file    Active member of club or organization: Not on file    Attends meetings of clubs or organizations: Not on file    Relationship status: Not on file  Other Topics Concern  . Not on file  Social History Narrative  . Not on file   Additional Social History:    Allergies:   Allergies  Allergen Reactions  . Avapro [Irbesartan] Other (See Comments)    Other reaction(s): Unknown unknown  . Azithromycin Other (See Comments)    Extreme vaginal burning. unknown  . Buspirone Other (See Comments)    Burning  sensations and felt overly hot   . Clindamycin Other (See Comments)    Vaginal burning  . Duloxetine Other (See Comments)    Hyperactivity.  . Duloxetine Hcl Other (See Comments)    Hyperactivity.  . Ezetimibe-Simvastatin Other (See Comments)    Arthralgias.  . Lipitor [Atorvastatin] Other (See Comments)    "muscle aches"  . Metronidazole Other (See Comments)  . Penicillins   . Prednisone Other (See Comments)    Dizziness/double vision  . Procaine Other (See Comments)    tremors  . Procaine Hcl     tremors  . Eggs Or Egg-Derived Products Other (See Comments)    Other Reaction: Not Assessed  . Other Other (See Comments) and Anxiety    Novacaine weakness  . Statins Rash    Arthralgias.  . Sulfa Antibiotics Rash    Vague history of a sulfa allergy, but does not remember the reaction.  . Tetracycline Rash    Labs:  Results for orders placed or performed during the hospital encounter of 03/06/19 (from the past 48 hour(s))  Comprehensive metabolic panel     Status: Abnormal   Collection Time: 03/06/19  7:34 PM  Result Value Ref Range   Sodium 138 135 - 145 mmol/L   Potassium 4.1 3.5 - 5.1 mmol/L   Chloride 108 98 - 111 mmol/L   CO2 24 22 - 32 mmol/L   Glucose, Bld 118 (H) 70 - 99 mg/dL   BUN 32  (H) 8 - 23 mg/dL   Creatinine, Ser 1.24 (H) 0.44 - 1.00 mg/dL   Calcium 9.4 8.9 - 10.3 mg/dL   Total Protein 5.9 (L) 6.5 - 8.1 g/dL   Albumin 3.3 (L) 3.5 - 5.0 g/dL   AST 19 15 - 41 U/L   ALT 18 0 - 44 U/L   Alkaline Phosphatase 67 38 - 126 U/L   Total Bilirubin 0.7 0.3 - 1.2 mg/dL   GFR calc non Af Amer 40 (L) >60 mL/min   GFR calc Af Amer 46 (L) >60 mL/min   Anion gap 6 5 - 15    Comment: Performed at [facility], [address] [place], [place] [address]  Acetaminophen level     Status: Abnormal   Collection Time: 03/06/19  7:34 PM  Result Value Ref Range   Acetaminophen (Tylenol), Serum <10 (L) 10 - 30 ug/mL    Comment: (NOTE) Therapeutic concentrations vary significantly. A range of 10-30 ug/mL  may be an effective concentration for many patients. However, some  are best treated at concentrations outside of this range. Acetaminophen concentrations >150 ug/mL at 4 hours after ingestion  and >50 ug/mL at 12 hours after ingestion are often associated with  toxic reactions. Performed at [facility], [address] [place], [place] [address]   Ethanol     Status: None   Collection Time: 03/06/19  7:34 PM  Result Value Ref Range   Alcohol, Ethyl (B) <10 <10 mg/dL    Comment: (NOTE) Lowest detectable limit for serum alcohol is 10 mg/dL. For medical purposes only. Performed at [facility], [address] [place], [place] [address]   Salicylate level     Status: None   Collection Time: 03/06/19  7:34 PM  Result Value Ref Range   Salicylate Lvl <4.0 2.8 - 30.0 mg/dL    Comment: Performed at [facility], [address], [place], [place] [address]  CBC with Differential     Status: None   Collection  Time: 03/06/19  7:34 PM  Result Value Ref Range   WBC 6.4 4.0 - 10.5 K/uL   RBC 3.91 3.87 - 5.11 MIL/uL   Hemoglobin 12.1 12.0 - 15.0 g/dL   HCT 37.2 36.0 - 46.0 %   MCV 95.1 80.0 - 100.0 fL   MCH 30.9 26.0 - 34.0 pg   MCHC 32.5  30.0 - 36.0 g/dL   RDW 14.3 11.5 - 15.5 %   Platelets 214 150 - 400 K/uL   nRBC 0.0 0.0 - 0.2 %   Neutrophils Relative % 61 %   Neutro Abs 4.0 1.7 - 7.7 K/uL   Lymphocytes Relative 25 %   Lymphs Abs 1.6 0.7 - 4.0 K/uL   Monocytes Relative 10 %   Monocytes Absolute 0.6 0.1 - 1.0 K/uL   Eosinophils Relative 2 %   Eosinophils Absolute 0.1 0.0 - 0.5 K/uL   Basophils Relative 1 %   Basophils Absolute 0.0 0.0 - 0.1 K/uL   Immature Granulocytes 1 %   Abs Immature Granulocytes 0.04 0.00 - 0.07 K/uL    Comment: Performed at [facility], [address] [place], [place] [address]  Urinalysis, Complete w Microscopic     Status: Abnormal   Collection Time: 03/06/19  7:34 PM  Result Value Ref Range   Color, Urine YELLOW (A) YELLOW   APPearance HAZY (A) CLEAR   Specific Gravity, Urine 1.016 1.005 - 1.030   pH 5.0 5.0 - 8.0   Glucose, UA NEGATIVE NEGATIVE mg/dL   Hgb urine dipstick NEGATIVE NEGATIVE   Bilirubin Urine NEGATIVE NEGATIVE   Ketones, ur NEGATIVE NEGATIVE mg/dL   Protein, ur NEGATIVE NEGATIVE mg/dL   Nitrite NEGATIVE NEGATIVE   Leukocytes,Ua NEGATIVE NEGATIVE   RBC / HPF 0-5 0 - 5 RBC/hpf   WBC, UA 0-5 0 - 5 WBC/hpf   Bacteria, UA NONE SEEN NONE SEEN   Squamous Epithelial / LPF 0-5 0 - 5   Mucus PRESENT    Hyaline Casts, UA PRESENT     Comment: Performed at [facility], [address], [place], [place] [address]  Urine Drug Screen, Qualitative     Status: Abnormal   Collection Time: 03/06/19  7:34 PM  Result Value Ref Range   Tricyclic, Ur Screen POSITIVE (A) NONE DETECTED   Amphetamines, Ur Screen NONE DETECTED NONE DETECTED   MDMA (Ecstasy)Ur Screen NONE DETECTED NONE DETECTED   Cocaine Metabolite,Ur [place] NONE DETECTED NONE DETECTED   Opiate, Ur Screen NONE DETECTED NONE DETECTED   Phencyclidine (PCP) Ur S NONE DETECTED NONE DETECTED   Cannabinoid 50 Ng, Ur [place] NONE DETECTED NONE DETECTED   Barbiturates, Ur Screen NONE DETECTED NONE DETECTED    Benzodiazepine, Ur Scrn POSITIVE (A) NONE DETECTED   Methadone Scn, Ur NONE DETECTED NONE DETECTED    Comment: (NOTE) Tricyclics + metabolites, urine    Cutoff 1000 ng/mL Amphetamines + metabolites, urine  Cutoff 1000 ng/mL MDMA (Ecstasy), urine              Cutoff 500 ng/mL Cocaine Metabolite, urine          Cutoff 300 ng/mL Opiate + metabolites, urine        Cutoff 300 ng/mL Phencyclidine (PCP), urine         Cutoff 25 ng/mL Cannabinoid, urine                 Cutoff 50 ng/mL Barbiturates + metabolites, urine  Cutoff 200 ng/mL Benzodiazepine, urine  Cutoff 200 ng/mL Methadone, urine                   Cutoff 300 ng/mL The urine drug screen provides only a preliminary, unconfirmed analytical test result and should not be used for non-medical purposes. Clinical consideration and professional judgment should be applied to any positive drug screen result due to possible interfering substances. A more specific alternate chemical method must be used in order to obtain a confirmed analytical result. Gas chromatography / mass spectrometry (GC/MS) is the preferred confirmat ory method. Performed at [facility] [place], [place] [address]   SARS CORONAVIRUS 2 Nasal Swab Aptima Multi Swab     Status: None   Collection Time: 03/06/19  9:56 PM   Specimen: Aptima Multi Swab; Nasal Swab  Result Value Ref Range   SARS Coronavirus 2 NEGATIVE NEGATIVE    Comment: (NOTE) SARS-CoV-2 target nucleic acids are NOT DETECTED. The SARS-CoV-2 RNA is generally detectable in upper and lower respiratory specimens during the acute phase of infection. Negative results do not preclude SARS-CoV-2 infection, do not rule out co-infections with other pathogens, and should not be used as the sole basis for treatment or other patient management decisions. Negative results must be combined with clinical observations, patient history, and epidemiological information. The  expected result is Negative. Fact Sheet for Patients: SugarRoll.be Fact Sheet for Healthcare Providers: https://www.woods-mathews.com/ This test is not yet approved or cleared by the [place] FDA and  has been authorized for detection and/or diagnosis of SARS-CoV-2 by FDA under an Emergency Use Authorization (EUA). This EUA will remain  in effect (meaning this test can be used) for the duration of the COVID-19 declaration under Section 56 4(b)(1) of the Act, 21 U.S.C. section 360bbb-3(b)(1), unless the authorization is terminated or revoked sooner. Performed at [facility], [place] [address] [place], [place] [address]   Salicylate level     Status: None   Collection Time: 03/07/19 12:06 AM  Result Value Ref Range   Salicylate Lvl <6.7 2.8 - 30.0 mg/dL    Comment: Performed at [facility], [address] [place], [place] [address]  Acetaminophen level     Status: Abnormal   Collection Time: 03/07/19 12:06 AM  Result Value Ref Range   Acetaminophen (Tylenol), Serum <10 (L) 10 - 30 ug/mL    Comment: (NOTE) Therapeutic concentrations vary significantly. A range of 10-30 ug/mL  may be an effective concentration for many patients. However, some  are best treated at concentrations outside of this range. Acetaminophen concentrations >150 ug/mL at 4 hours after ingestion  and >50 ug/mL at 12 hours after ingestion are often associated with  toxic reactions. Performed at [facility], [address] [place], [place] [address]   CBC     Status: None   Collection Time: 03/07/19 10:03 AM  Result Value Ref Range   WBC 6.5 4.0 - 10.5 K/uL   RBC 3.89 3.87 - 5.11 MIL/uL   Hemoglobin 12.2 12.0 - 15.0 g/dL   HCT 37.2 36.0 - 46.0 %   MCV 95.6 80.0 - 100.0 fL   MCH 31.4 26.0 - 34.0 pg   MCHC 32.8 30.0 - 36.0 g/dL   RDW 14.3 11.5 - 15.5 %   Platelets 212 150 - 400 K/uL   nRBC 0.0 0.0 - 0.2 %    Comment: Performed at  [facility] [address] [place], [place] [address]  Basic metabolic panel  Status: Abnormal   Collection Time: 03/07/19 10:03 AM  Result Value Ref Range   Sodium 139 135 - 145 mmol/L   Potassium 4.3 3.5 - 5.1 mmol/L   Chloride 109 98 - 111 mmol/L   CO2 23 22 - 32 mmol/L   Glucose, Bld 100 (H) 70 - 99 mg/dL   BUN 28 (H) 8 - 23 mg/dL   Creatinine, Ser 1.13 (H) 0.44 - 1.00 mg/dL   Calcium 9.0 8.9 - 10.3 mg/dL   GFR calc non Af Amer 45 (L) >60 mL/min   GFR calc Af Amer 52 (L) >60 mL/min   Anion gap 7 5 - 15    Comment: Performed at [facility], [address] [place], [place] [address]  Protime-INR     Status: None   Collection Time: 03/07/19 10:03 AM  Result Value Ref Range   Prothrombin Time 12.8 11.4 - 15.2 seconds   INR 1.0 0.8 - 1.2    Comment: (NOTE) INR goal varies based on device and disease states. Performed at [facility], [address] [place], [place] [address]   TSH     Status: None   Collection Time: 03/07/19 10:03 AM  Result Value Ref Range   TSH 2.517 0.350 - 4.500 uIU/mL    Comment: Performed by a 3rd Generation assay with a functional sensitivity of <=0.01 uIU/mL. Performed at [facility], [address] [place], [place] [address]   Hemoglobin A1c     Status: Abnormal   Collection Time: 03/07/19 10:03 AM  Result Value Ref Range   Hgb A1c MFr Bld 6.0 (H) 4.8 - 5.6 %    Comment: (NOTE) Pre diabetes:          5.7%-6.4% Diabetes:              >6.4% Glycemic control for   <7.0% adults with diabetes    Mean Plasma Glucose 125.5 mg/dL    Comment: Performed at [facility] [address] [place], [place] [address]  Glucose, capillary     Status: Abnormal   Collection Time: 03/07/19 12:22 PM  Result Value Ref Range   Glucose-Capillary 125 (H) 70 - 99 mg/dL  Glucose, capillary     Status: None   Collection Time: 03/07/19  4:22 PM  Result Value Ref Range   Glucose-Capillary 90 70 - 99 mg/dL     Current Facility-Administered Medications  Medication Dose Route Frequency Provider Last Rate Last Dose  . 0.9 %  sodium chloride infusion   Intravenous Continuous Gouru, Aruna, MD 50 mL/hr at 03/07/19 1300    . calcium-vitamin D (OSCAL WITH D) 500-200 MG-UNIT per tablet 1 tablet  1 tablet Oral BID Gouru, Aruna, MD      . docusate sodium (COLACE) capsule 100 mg  100 mg Oral Daily PRN Gouru, Aruna, MD      . enalapril (VASOTEC) tablet 10 mg  10 mg Oral BID Gouru, Aruna, MD      . enoxaparin (LOVENOX) injection 40 mg  40 mg Subcutaneous Q24H Gouru, Aruna, MD   40 mg at 03/07/19 1216  . insulin aspart (novoLOG) injection 0-5 Units  0-5 Units Subcutaneous QHS Gouru, Aruna, MD      . insulin aspart (novoLOG) injection 0-9 Units  0-9 Units Subcutaneous TID WC Gouru, Aruna, MD   1 Units at 03/07/19 1224  . metoprolol succinate (TOPROL-XL) 24 hr tablet 50 mg  50 mg Oral Daily Gouru, Aruna, MD   50 mg  at 03/07/19 1630  . ondansetron (ZOFRAN) tablet 4 mg  4 mg Oral Q6H PRN Gouru, Aruna, MD       Or  . ondansetron (ZOFRAN) injection 4 mg  4 mg Intravenous Q6H PRN Gouru, Aruna, MD      . pantoprazole (PROTONIX) EC tablet 40 mg  40 mg Oral Daily Gouru, Aruna, MD   40 mg at 03/07/19 1216  . polyethylene glycol (MIRALAX / GLYCOLAX) packet 17 g  17 g Oral Daily Gouru, Aruna, MD   17 g at 03/07/19 1540  . vitamin B-12 (CYANOCOBALAMIN) tablet 1,000 mcg  1,000 mcg Oral Daily Gouru, Aruna, MD   1,000 mcg at 03/07/19 1000    Musculoskeletal: Strength & Muscle Tone: decreased Gait & Station: did not witness Patient leans: N/A  Psychiatric Specialty Exam: Physical Exam  Nursing note and vitals reviewed. Constitutional: She is oriented to person, place, and time. She appears well-developed and well-nourished.  HENT:  Head: Normocephalic.  Neck: Normal range of motion.  Respiratory: Effort normal.  Musculoskeletal: Normal range of motion.  Neurological: She is alert and oriented to person, place, and  time.  Psychiatric: Her speech is normal and behavior is normal. Her affect is blunt. Cognition and memory are normal. She expresses impulsivity. She exhibits a depressed mood. She expresses suicidal ideation. She expresses suicidal plans.    Review of Systems  Psychiatric/Behavioral: Positive for depression and suicidal ideas.  All other systems reviewed and are negative.   Blood pressure (!) 154/73, pulse 70, temperature 98.4 F (36.9 C), temperature source Oral, resp. rate 18, height 5\' 4"  (1.626 m), weight 75.8 kg, SpO2 98 %.Body mass index is 28.67 kg/m.  General Appearance: Casual  Eye Contact:  Good  Speech:  Normal Rate  Volume:  Normal  Mood:  Depressed  Affect:  Blunt  Thought Process:  Coherent and Descriptions of Associations: Intact  Orientation:  Full (Time, Place, and Person)  Thought Content:  Rumination  Suicidal Thoughts:  Yes.  with intent/plan  Homicidal Thoughts:  None  Memory:  Immediate;   Good Recent;   Good Remote;   Good  Judgement:  Poor  Insight:  Fair  Psychomotor Activity:  Decreased  Concentration:  Concentration: Fair and Attention Span: Fair  Recall:  [place] of Knowledge:  Good  Language:  Good  Akathisia:  No  Handed:  Right  AIMS (if indicated):     Assets:  Housing Leisure Time Physical Health Resilience Social Support  ADL's:  Intact  Cognition:  WNL  Sleep:       Treatment Plan Summary: Daily contact with patient to assess and evaluate symptoms and progress in treatment, Medication management and Plan major depressive disorder, recurrent, severe without psychosis:  -No medications started due to overdose and need to medically clear -Inpatient psychiatric admission needed  Disposition: Recommend psychiatric Inpatient admission when medically cleared.  Waylan Boga, NP 03/07/2019 6:23 PM

## 2019-03-08 DIAGNOSIS — T43222A Poisoning by selective serotonin reuptake inhibitors, intentional self-harm, initial encounter: Secondary | ICD-10-CM

## 2019-03-08 LAB — BASIC METABOLIC PANEL
Anion gap: 8 (ref 5–15)
BUN: 22 mg/dL (ref 8–23)
CO2: 21 mmol/L — ABNORMAL LOW (ref 22–32)
Calcium: 9.1 mg/dL (ref 8.9–10.3)
Chloride: 110 mmol/L (ref 98–111)
Creatinine, Ser: 1.14 mg/dL — ABNORMAL HIGH (ref 0.44–1.00)
GFR calc Af Amer: 51 mL/min — ABNORMAL LOW (ref >60)
GFR calc non Af Amer: 44 mL/min — ABNORMAL LOW (ref >60)
Glucose, Bld: 104 mg/dL — ABNORMAL HIGH (ref 70–99)
Potassium: 3.9 mmol/L (ref 3.5–5.1)
Sodium: 139 mmol/L (ref 135–145)

## 2019-03-08 LAB — CBC
HCT: 34.7 % — ABNORMAL LOW (ref 36.0–46.0)
Hemoglobin: 11.5 g/dL — ABNORMAL LOW (ref 12.0–15.0)
MCH: 31 pg (ref 26.0–34.0)
MCHC: 33.1 g/dL (ref 30.0–36.0)
MCV: 93.5 fL (ref 80.0–100.0)
Platelets: 204 10*3/uL (ref 150–400)
RBC: 3.71 MIL/uL — ABNORMAL LOW (ref 3.87–5.11)
RDW: 14.1 % (ref 11.5–15.5)
WBC: 5.3 10*3/uL (ref 4.0–10.5)
nRBC: 0 % (ref 0.0–0.2)

## 2019-03-08 LAB — GLUCOSE, CAPILLARY
Glucose-Capillary: 104 mg/dL — ABNORMAL HIGH (ref 70–99)
Glucose-Capillary: 98 mg/dL (ref 70–99)
Glucose-Capillary: 147 mg/dL — ABNORMAL HIGH (ref 70–99)
Glucose-Capillary: 122 mg/dL — ABNORMAL HIGH (ref 70–99)

## 2019-03-08 MED ORDER — LOPERAMIDE HCL 2 MG PO CAPS
4.0000 mg | ORAL_CAPSULE | ORAL | Status: DC | PRN
  Filled 2019-03-08 (×2): qty 2

## 2019-03-08 MED ORDER — HYDROCHLOROTHIAZIDE 25 MG PO TABS
25.0000 mg | ORAL_TABLET | Freq: Every day | ORAL | Status: DC
  Administered 2019-03-09 – 2019-03-11 (×3): 25 mg via ORAL
  Filled 2019-03-08 (×3): qty 1

## 2019-03-08 NOTE — Plan of Care (Signed)
Patient remains on 1: 1 with sitter in room. She denies pain.   Problem: Education: Goal: Knowledge of General Education information will improve Description: Including pain rating scale, medication(s)/side effects and non-pharmacologic comfort measures Outcome: Progressing   Problem: Health Behavior/Discharge Planning: Goal: Ability to manage health-related needs will improve Outcome: Progressing   Problem: Clinical Measurements: Goal: Ability to maintain clinical measurements within normal limits will improve Outcome: Progressing Goal: Will remain free from infection Outcome: Progressing Goal: Diagnostic test results will improve Outcome: Progressing Goal: Cardiovascular complication will be avoided Outcome: Progressing   Problem: Activity: Goal: Risk for activity intolerance will decrease Outcome: Progressing   Problem: Nutrition: Goal: Adequate nutrition will be maintained Outcome: Progressing   Problem: Elimination: Goal: Will not experience complications related to bowel motility Outcome: Progressing Goal: Will not experience complications related to urinary retention Outcome: Progressing   Problem: Pain Managment: Goal: General experience of comfort will improve Outcome: Progressing   Problem: Safety: Goal: Ability to remain free from injury will improve Outcome: Progressing   Problem: Skin Integrity: Goal: Risk for impaired skin integrity will decrease Outcome: Progressing

## 2019-03-08 NOTE — Progress Notes (Signed)
[facility] [place] NAME: Paige Dougherty    MR#:  [id number]  DATE OF BIRTH:  [birth year]  SUBJECTIVE:   States she is doing fine this morning.  She has no concerns.  She denies any suicidal ideations.  REVIEW OF SYSTEMS:  CONSTITUTIONAL: No fever, fatigue or weakness.  EYES: No blurred or double vision.  EARS, NOSE, AND THROAT: No tinnitus or ear pain.  RESPIRATORY: No cough, shortness of breath, wheezing or hemoptysis.  CARDIOVASCULAR: No chest pain, orthopnea, edema.  GASTROINTESTINAL: No nausea, vomiting, diarrhea or abdominal pain.  GENITOURINARY: No dysuria, hematuria.  ENDOCRINE: No polyuria, nocturia,  HEMATOLOGY: No anemia, easy bruising or bleeding SKIN: No rash or lesion. MUSCULOSKELETAL: No joint pain or arthritis.   NEUROLOGIC: No tingling, numbness, weakness.  PSYCHIATRY: Endorses depression  DRUG ALLERGIES:   Allergies  Allergen Reactions  . Avapro [Irbesartan] Other (See Comments)    Other reaction(s): Unknown unknown  . Azithromycin Other (See Comments)    Extreme vaginal burning. unknown  . Buspirone Other (See Comments)    Burning sensations and felt overly hot   . Clindamycin Other (See Comments)    Vaginal burning  . Duloxetine Other (See Comments)    Hyperactivity.  . Duloxetine Hcl Other (See Comments)    Hyperactivity.  . Ezetimibe-Simvastatin Other (See Comments)    Arthralgias.  . Lipitor [Atorvastatin] Other (See Comments)    "muscle aches"  . Metronidazole Other (See Comments)  . Penicillins   . Prednisone Other (See Comments)    Dizziness/double vision  . Procaine Other (See Comments)    tremors  . Procaine Hcl     tremors  . Eggs Or Egg-Derived Products Other (See Comments)    Other Reaction: Not Assessed  . Other Other (See Comments) and Anxiety    Novacaine weakness  . Statins Rash    Arthralgias.  . Sulfa Antibiotics Rash    Vague history of a sulfa allergy, but does not  remember the reaction.  . Tetracycline Rash    VITALS:  Blood pressure (!) 168/66, pulse 67, temperature 98.4 F (36.9 C), temperature source Oral, resp. rate 12, height 5\' 4"  (1.626 m), weight 75.8 kg, SpO2 95 %.  PHYSICAL EXAMINATION:  GENERAL:  85 y.o.-year-old patient lying in the bed with no acute distress.  EYES: Pupils equal, round, reactive to light and accommodation. No scleral icterus. Extraocular muscles intact.  HEENT: Head atraumatic, normocephalic. Oropharynx and nasopharynx clear.  NECK:  Supple, no jugular venous distention. No thyroid enlargement, no tenderness.  LUNGS: Normal breath sounds bilaterally, no wheezing, rales,rhonchi or crepitation. No use of accessory muscles of respiration.  CARDIOVASCULAR: S1, S2 normal. No murmurs, rubs, or gallops.  ABDOMEN: Soft, nontender, nondistended. Bowel sounds present. No organomegaly or mass.  EXTREMITIES: No pedal edema, cyanosis, or clubbing.  NEUROLOGIC: Cranial nerves II through XII are intact. Muscle strength at her baseline in all extremities. Sensation intact. Gait not checked.  PSYCHIATRIC: The patient is alert and oriented x 3.  SKIN: No obvious rash, lesion, or ulcer.    LABORATORY PANEL:   CBC Recent Labs  Lab 03/08/19 0903  WBC 5.3  HGB 11.5*  HCT 34.7*  PLT 204   ------------------------------------------------------------------------------------------------------------------  Chemistries  Recent Labs  Lab 03/06/19 1934  03/08/19 0903  NA 138   < > 139  K 4.1   < > 3.9  CL 108   < > 110  CO2 24   < > 21*  GLUCOSE 118*   < > 104*  BUN 32*   < > 22  CREATININE 1.24*   < > 1.14*  CALCIUM 9.4   < > 9.1  AST 19  --   --   ALT 18  --   --   ALKPHOS 67  --   --   BILITOT 0.7  --   --    < > = values in this interval not displayed.   ------------------------------------------------------------------------------------------------------------------  Cardiac Enzymes No results for input(s):  TROPONINI in the last 168 hours. ------------------------------------------------------------------------------------------------------------------  RADIOLOGY:  No results found.  EKG:   Orders placed or performed during the hospital encounter of 03/06/19  . ED EKG  . ED EKG  . EKG 12-Lead  . EKG 12-Lead  . EKG 12-Lead  . EKG 12-Lead  . EKG 12-Lead  . EKG 12-Lead  . EKG 12-Lead  . EKG 12-Lead  . EKG 12-Lead  . EKG 12-Lead  . EKG 12-Lead  . EKG 12-Lead  . EKG 12-Lead  . EKG 12-Lead  . EKG 12-Lead  . EKG 12-Lead  . EKG 12-Lead  . EKG 12-Lead  . EKG 12-Lead  . EKG 12-Lead  . EKG 12-Lead  . EKG 12-Lead    ASSESSMENT AND PLAN:   Intentional drug overdose with Celexa, Ativan, melatonin, and Remeron -Psych consulted-recommended inpatient psych admission -Patient is medically cleared for discharge to inpatient psych bed -Continue IVC and one-on-one sitter -Restart home psych meds as able  AKI- improving -Continue IV fluids  Type 2 diabetes-blood sugars controlled -Continue moderate sliding scale insulin  Essential hypertension- BP elevated today -Continue home BP meds  All the records are reviewed and case discussed with Care Management/Social Workerr. Management plans discussed with the patient, called  daughter [phone] -left a vm to call back for an update   CODE STATUS: Full  TOTAL TIME TAKING CARE OF THIS PATIENT: 38 minutes.   POSSIBLE D/C IN 1-2 DAYS, DEPENDING ON CLINICAL CONDITION.  Note: This dictation was prepared with Dragon dictation along with smaller phrase technology. Any transcriptional errors that result from this process are unintentional.   Evette Doffing M.D on 03/08/2019 at 3:44 PM  Between 7am to 6pm - Pager - [phone]  After 6pm go to [email or web address] - password [email or web address] [facility]  Office  [phone]  CC: Primary care physician; [place]

## 2019-03-08 NOTE — Consult Note (Signed)
[facility] Consult   Reason for Consult:  Overdose, intentional Referring Physician:  EDP Patient Identification: Paige Dougherty MRN:  [id number] Principal Diagnosis: Major depressive disorder, recurrent severe without psychotic features ([place]) Diagnosis:  Principal Problem:   Major depressive disorder, recurrent severe without psychotic features ([place]) Active Problems:   Overdose, intentional self-harm, initial encounter ([place])  Total Time spent with patient: 30 minutes  Subjective:   Paige Dougherty is a 84 y.o. female patient admitted with intentional overdose.  Continues to endorse depression and hopelessness.  When communicated that she would need geriatric psychiatry, she stated, "I've been down this road before."    03/08/19:  Patient seen and evaluated face-to-face in her hospital room.  Patient lying in bed and alert.  She is more communicative today and continues to endorse depression and hopelessness.  She is frustrated with her life at this time and understands she will have to continue her care inpatient, preferably geriatric psych.  Sleep was "fair", appetite is "Ok".  Depressed mood with blunt affect, reports her memory is "not what it use to be".  She did not remember talking to this provider yesterday but she was also tired and wanted to nap at that time.  Pleasant and cooperative.    03/07/19:  Patient seen and evaluated face-to-face in her hospital room.  Patient lying in bed with her eyes closed, reports she will answer a few questions and then will sleep.  Endorses depression and hopelessness as her finances are overwhelming.  Verbalizes she knows she needs to come inpatient for mental health care but "can't afford it."  She lives at home with her husband and a supportive daughter.  She has been going to her outpatient psychiatrist via telepsych and her daughter interprets as she cannot understand her.    HPI:  Per TTS on admission to the ED:  Paige Dougherty is an 85  y.o. female.  The pt came in after overdosing on 10-20 pills.  The pt stated, "Why can't I just die?  I thought it was the right time"  She stated she is worried about everything, such as her husbands health.  She has had a previous attempt about 10 years ago of overdosing.  She is is seeing a psychiatrist at [facility].  She lives with her husband.  The pt denies self harm, HI, legal issues, history of abuse and hallucinations.  She stated she is sleeping about 4 hours a night and has a fair appetite.  She reports feeling hopeless and having a hard time getting out of bed.  She denies SA and her UDS is negative for all substances except benzodiazepines, which she is prescribed.  Past Psychiatric History: depression, anxiety  Risk to Self: Suicidal Ideation: Yes-Currently Present Suicidal Intent: Yes-Currently Present Is patient at risk for suicide?: Yes Suicidal Plan?: Yes-Currently Present Specify Current Suicidal Plan: OD on pills Access to Means: Yes Specify Access to Suicidal Means: has pills What has been your use of drugs/alcohol within the last 12 months?: none How many times?: 2 Other Self Harm Risks: none Triggers for Past Attempts: Unpredictable Intentional Self Injurious Behavior: None Risk to Others: Homicidal Ideation: No Thoughts of Harm to Others: No Current Homicidal Intent: No Current Homicidal Plan: No Access to Homicidal Means: No Identified Victim: pt denies History of harm to others?: No Assessment of Violence: None Noted Violent Behavior Description: none Does patient have access to weapons?: No Criminal Charges Pending?: No Does patient have a court  date: No Prior Inpatient Therapy: Prior Inpatient Therapy: Yes Prior Therapy Dates: 2010 Prior Therapy Facilty/Provider(s): [facility] Reason for Treatment: SI Prior Outpatient Therapy: Prior Outpatient Therapy: Yes Prior Therapy Dates: current Prior Therapy Facilty/Provider(s): [facility] psychiatric Reason  for Treatment: depression Does patient have an ACCT team?: No Does patient have Intensive In-House Services?  : No Does patient have [facility] services? : No Does patient have P4CC services?: No  Past Medical History:  Past Medical History:  Diagnosis Date  . Allergy   . Anxiety   . Arthritis    HANDS/FEET  . Barrett's esophagus   . Breast cancer ([place]) 2016   RIGHT lumpectomy 2016 INVASIVE MAMMARY CARCINOMA   . Cancer of breast ([place]) 12/24/2014   radiation- Right  . Chronic headache   . Depression   . Diabetes mellitus   . Diverticulosis   . Esophageal stricture   . Fibrocystic breast disease   . GERD (gastroesophageal reflux disease)   . Hyperlipemia   . Hypertension   . IBS (irritable bowel syndrome)   . Personal history of radiation therapy 2016   RIGHT lumpectomy    INVASIVE MAMMARY CARCINOMA   . Urinary incontinence     Past Surgical History:  Procedure Laterality Date  . ABDOMINAL HYSTERECTOMY    . BREAST BIOPSY Bilateral    core bxs  . BREAST EXCISIONAL BIOPSY Right 2016   +  . BREAST LUMPECTOMY Right 2016   INVASIVE MAMMARY CARCINOMA   . CARPAL TUNNEL RELEASE     bilateral  . CATARACT EXTRACTION     bilateral   . COLONOSCOPY    . DILATION AND CURETTAGE OF UTERUS    . finger cyst removal    . neck cyst removal    . UPPER GASTROINTESTINAL ENDOSCOPY     Family History:  Family History  Problem Relation Age of Onset  . Colon cancer Father   . Thyroid disease Father   . Thyroid disease Mother   . Hypertension Mother   . Alzheimer's disease Mother   . Colon cancer Maternal Grandmother   . Breast cancer Maternal Grandmother   . Anxiety disorder Daughter    Family Psychiatric  History: see above Social History:  Social History   Substance and Sexual Activity  Alcohol Use Yes   Comment: occasional     Social History   Substance and Sexual Activity  Drug Use No    Social History   Socioeconomic History  . Marital status: Married    Spouse  name: Not on file  . Number of children: Not on file  . Years of education: Not on file  . Highest education level: Not on file  Occupational History  . Not on file  Social Needs  . Financial resource strain: Not on file  . Food insecurity    Worry: Not on file    Inability: Not on file  . Transportation needs    Medical: Not on file    Non-medical: Not on file  Tobacco Use  . Smoking status: Never Smoker  . Smokeless tobacco: Never Used  Substance and Sexual Activity  . Alcohol use: Yes    Comment: occasional  . Drug use: No  . Sexual activity: Not on file  Lifestyle  . Physical activity    Days per week: Not on file    Minutes per session: Not on file  . Stress: Not on file  Relationships  . Social connections    Talks on phone: Not on  file    Gets together: Not on file    Attends religious service: Not on file    Active member of club or organization: Not on file    Attends meetings of clubs or organizations: Not on file    Relationship status: Not on file  Other Topics Concern  . Not on file  Social History Narrative  . Not on file   Additional Social History:    Allergies:   Allergies  Allergen Reactions  . Avapro [Irbesartan] Other (See Comments)    Other reaction(s): Unknown unknown  . Azithromycin Other (See Comments)    Extreme vaginal burning. unknown  . Buspirone Other (See Comments)    Burning sensations and felt overly hot   . Clindamycin Other (See Comments)    Vaginal burning  . Duloxetine Other (See Comments)    Hyperactivity.  . Duloxetine Hcl Other (See Comments)    Hyperactivity.  . Ezetimibe-Simvastatin Other (See Comments)    Arthralgias.  . Lipitor [Atorvastatin] Other (See Comments)    "muscle aches"  . Metronidazole Other (See Comments)  . Penicillins   . Prednisone Other (See Comments)    Dizziness/double vision  . Procaine Other (See Comments)    tremors  . Procaine Hcl     tremors  . Eggs Or Egg-Derived Products Other  (See Comments)    Other Reaction: Not Assessed  . Other Other (See Comments) and Anxiety    Novacaine weakness  . Statins Rash    Arthralgias.  . Sulfa Antibiotics Rash    Vague history of a sulfa allergy, but does not remember the reaction.  . Tetracycline Rash    Labs:  Results for orders placed or performed during the hospital encounter of 03/06/19 (from the past 48 hour(s))  Comprehensive metabolic panel     Status: Abnormal   Collection Time: 03/06/19  7:34 PM  Result Value Ref Range   Sodium 138 135 - 145 mmol/L   Potassium 4.1 3.5 - 5.1 mmol/L   Chloride 108 98 - 111 mmol/L   CO2 24 22 - 32 mmol/L   Glucose, Bld 118 (H) 70 - 99 mg/dL   BUN 32 (H) 8 - 23 mg/dL   Creatinine, Ser 1.24 (H) 0.44 - 1.00 mg/dL   Calcium 9.4 8.9 - 10.3 mg/dL   Total Protein 5.9 (L) 6.5 - 8.1 g/dL   Albumin 3.3 (L) 3.5 - 5.0 g/dL   AST 19 15 - 41 U/L   ALT 18 0 - 44 U/L   Alkaline Phosphatase 67 38 - 126 U/L   Total Bilirubin 0.7 0.3 - 1.2 mg/dL   GFR calc non Af Amer 40 (L) >60 mL/min   GFR calc Af Amer 46 (L) >60 mL/min   Anion gap 6 5 - 15    Comment: Performed at [facility], [address] [place], [place] [address]  Acetaminophen level     Status: Abnormal   Collection Time: 03/06/19  7:34 PM  Result Value Ref Range   Acetaminophen (Tylenol), Serum <10 (L) 10 - 30 ug/mL    Comment: (NOTE) Therapeutic concentrations vary significantly. A range of 10-30 ug/mL  may be an effective concentration for many patients. However, some  are best treated at concentrations outside of this range. Acetaminophen concentrations >150 ug/mL at 4 hours after ingestion  and >50 ug/mL at 12 hours after ingestion are often associated with  toxic reactions. Performed at [facility], [address], [place], [place] [address]  Ethanol     Status: None   Collection Time: 03/06/19  7:34 PM  Result Value Ref Range   Alcohol, Ethyl (B) <10 <10 mg/dL    Comment: (NOTE) Lowest  detectable limit for serum alcohol is 10 mg/dL. For medical purposes only. Performed at [facility], [address] [place], [place] [address]   Salicylate level     Status: None   Collection Time: 03/06/19  7:34 PM  Result Value Ref Range   Salicylate Lvl <9.4 2.8 - 30.0 mg/dL    Comment: Performed at [facility], [address] [place], [place] [address]  CBC with Differential     Status: None   Collection Time: 03/06/19  7:34 PM  Result Value Ref Range   WBC 6.4 4.0 - 10.5 K/uL   RBC 3.91 3.87 - 5.11 MIL/uL   Hemoglobin 12.1 12.0 - 15.0 g/dL   HCT 37.2 36.0 - 46.0 %   MCV 95.1 80.0 - 100.0 fL   MCH 30.9 26.0 - 34.0 pg   MCHC 32.5 30.0 - 36.0 g/dL   RDW 14.3 11.5 - 15.5 %   Platelets 214 150 - 400 K/uL   nRBC 0.0 0.0 - 0.2 %   Neutrophils Relative % 61 %   Neutro Abs 4.0 1.7 - 7.7 K/uL   Lymphocytes Relative 25 %   Lymphs Abs 1.6 0.7 - 4.0 K/uL   Monocytes Relative 10 %   Monocytes Absolute 0.6 0.1 - 1.0 K/uL   Eosinophils Relative 2 %   Eosinophils Absolute 0.1 0.0 - 0.5 K/uL   Basophils Relative 1 %   Basophils Absolute 0.0 0.0 - 0.1 K/uL   Immature Granulocytes 1 %   Abs Immature Granulocytes 0.04 0.00 - 0.07 K/uL    Comment: Performed at [facility], [address] [place], [place] [address]  Urinalysis, Complete w Microscopic     Status: Abnormal   Collection Time: 03/06/19  7:34 PM  Result Value Ref Range   Color, Urine YELLOW (A) YELLOW   APPearance HAZY (A) CLEAR   Specific Gravity, Urine 1.016 1.005 - 1.030   pH 5.0 5.0 - 8.0   Glucose, UA NEGATIVE NEGATIVE mg/dL   Hgb urine dipstick NEGATIVE NEGATIVE   Bilirubin Urine NEGATIVE NEGATIVE   Ketones, ur NEGATIVE NEGATIVE mg/dL   Protein, ur NEGATIVE NEGATIVE mg/dL   Nitrite NEGATIVE NEGATIVE   Leukocytes,Ua NEGATIVE NEGATIVE   RBC / HPF 0-5 0 - 5 RBC/hpf   WBC, UA 0-5 0 - 5 WBC/hpf   Bacteria, UA NONE SEEN NONE SEEN   Squamous Epithelial / LPF 0-5 0 - 5   Mucus PRESENT     Hyaline Casts, UA PRESENT     Comment: Performed at [facility], [address] [place], [place] [address]  Urine Drug Screen, Qualitative     Status: Abnormal   Collection Time: 03/06/19  7:34 PM  Result Value Ref Range   Tricyclic, Ur Screen POSITIVE (A) NONE DETECTED   Amphetamines, Ur Screen NONE DETECTED NONE DETECTED   MDMA (Ecstasy)Ur Screen NONE DETECTED NONE DETECTED   Cocaine Metabolite,Ur [place] NONE DETECTED NONE DETECTED   Opiate, Ur Screen NONE DETECTED NONE DETECTED   Phencyclidine (PCP) Ur S NONE DETECTED NONE DETECTED   Cannabinoid 50 Ng, Ur [place] NONE DETECTED NONE DETECTED   Barbiturates, Ur Screen NONE DETECTED NONE DETECTED   Benzodiazepine, Ur Scrn POSITIVE (A) NONE DETECTED   Methadone Scn, Ur NONE DETECTED NONE DETECTED  Comment: (NOTE) Tricyclics + metabolites, urine    Cutoff 1000 ng/mL Amphetamines + metabolites, urine  Cutoff 1000 ng/mL MDMA (Ecstasy), urine              Cutoff 500 ng/mL Cocaine Metabolite, urine          Cutoff 300 ng/mL Opiate + metabolites, urine        Cutoff 300 ng/mL Phencyclidine (PCP), urine         Cutoff 25 ng/mL Cannabinoid, urine                 Cutoff 50 ng/mL Barbiturates + metabolites, urine  Cutoff 200 ng/mL Benzodiazepine, urine              Cutoff 200 ng/mL Methadone, urine                   Cutoff 300 ng/mL The urine drug screen provides only a preliminary, unconfirmed analytical test result and should not be used for non-medical purposes. Clinical consideration and professional judgment should be applied to any positive drug screen result due to possible interfering substances. A more specific alternate chemical method must be used in order to obtain a confirmed analytical result. Gas chromatography / mass spectrometry (GC/MS) is the preferred confirmat ory method. Performed at [facility], [place], [place] [address]   SARS CORONAVIRUS 2 Nasal Swab Aptima Multi Swab     Status:  None   Collection Time: 03/06/19  9:56 PM   Specimen: Aptima Multi Swab; Nasal Swab  Result Value Ref Range   SARS Coronavirus 2 NEGATIVE NEGATIVE    Comment: (NOTE) SARS-CoV-2 target nucleic acids are NOT DETECTED. The SARS-CoV-2 RNA is generally detectable in upper and lower respiratory specimens during the acute phase of infection. Negative results do not preclude SARS-CoV-2 infection, do not rule out co-infections with other pathogens, and should not be used as the sole basis for treatment or other patient management decisions. Negative results must be combined with clinical observations, patient history, and epidemiological information. The expected result is Negative. Fact Sheet for Patients: SugarRoll.be Fact Sheet for Healthcare Providers: https://www.woods-mathews.com/ This test is not yet approved or cleared by the [place] FDA and  has been authorized for detection and/or diagnosis of SARS-CoV-2 by FDA under an Emergency Use Authorization (EUA). This EUA will remain  in effect (meaning this test can be used) for the duration of the COVID-19 declaration under Section 56 4(b)(1) of the Act, 21 U.S.C. section 360bbb-3(b)(1), unless the authorization is terminated or revoked sooner. Performed at [facility], [place] [address], [place], [place] [address]   Salicylate level     Status: None   Collection Time: 03/07/19 12:06 AM  Result Value Ref Range   Salicylate Lvl <4.1 2.8 - 30.0 mg/dL    Comment: Performed at [facility], [address] [place],  [address]  Acetaminophen level     Status: Abnormal   Collection Time: 03/07/19 12:06 AM  Result Value Ref Range   Acetaminophen (Tylenol), Serum <10 (L) 10 - 30 ug/mL    Comment: (NOTE) Therapeutic concentrations vary significantly. A range of 10-30 ug/mL  may be an effective concentration for many patients. However, some  are best treated at  concentrations outside of this range. Acetaminophen concentrations >150 ug/mL at 4 hours after ingestion  and >50 ug/mL at 12 hours after ingestion are often associated with  toxic reactions. Performed at [facility], [address],  [place], [place] [address]   CBC     Status: None   Collection Time: 03/07/19 10:03 AM  Result Value Ref Range   WBC 6.5 4.0 - 10.5 K/uL   RBC 3.89 3.87 - 5.11 MIL/uL   Hemoglobin 12.2 12.0 - 15.0 g/dL   HCT 37.2 36.0 - 46.0 %   MCV 95.6 80.0 - 100.0 fL   MCH 31.4 26.0 - 34.0 pg   MCHC 32.8 30.0 - 36.0 g/dL   RDW 14.3 11.5 - 15.5 %   Platelets 212 150 - 400 K/uL   nRBC 0.0 0.0 - 0.2 %    Comment: Performed at [facility], [address] [place], [place] [address]  Basic metabolic panel     Status: Abnormal   Collection Time: 03/07/19 10:03 AM  Result Value Ref Range   Sodium 139 135 - 145 mmol/L   Potassium 4.3 3.5 - 5.1 mmol/L   Chloride 109 98 - 111 mmol/L   CO2 23 22 - 32 mmol/L   Glucose, Bld 100 (H) 70 - 99 mg/dL   BUN 28 (H) 8 - 23 mg/dL   Creatinine, Ser 1.13 (H) 0.44 - 1.00 mg/dL   Calcium 9.0 8.9 - 10.3 mg/dL   GFR calc non Af Amer 45 (L) >60 mL/min   GFR calc Af Amer 52 (L) >60 mL/min   Anion gap 7 5 - 15    Comment: Performed at [facility], [address] [place], [place] [address]  Protime-INR     Status: None   Collection Time: 03/07/19 10:03 AM  Result Value Ref Range   Prothrombin Time 12.8 11.4 - 15.2 seconds   INR 1.0 0.8 - 1.2    Comment: (NOTE) INR goal varies based on device and disease states. Performed at [facility], [address] [place], [place] [address]   TSH     Status: None   Collection Time: 03/07/19 10:03 AM  Result Value Ref Range   TSH 2.517 0.350 - 4.500 uIU/mL    Comment: Performed by a 3rd Generation assay with a functional sensitivity of <=0.01 uIU/mL. Performed at [facility], [address] [place], [place] [address]   Hemoglobin A1c      Status: Abnormal   Collection Time: 03/07/19 10:03 AM  Result Value Ref Range   Hgb A1c MFr Bld 6.0 (H) 4.8 - 5.6 %    Comment: (NOTE) Pre diabetes:          5.7%-6.4% Diabetes:              >6.4% Glycemic control for   <7.0% adults with diabetes    Mean Plasma Glucose 125.5 mg/dL    Comment: Performed at [facility] [address], [place], [place] [address]  Glucose, capillary     Status: Abnormal   Collection Time: 03/07/19 12:22 PM  Result Value Ref Range   Glucose-Capillary 125 (H) 70 - 99 mg/dL  Glucose, capillary     Status: None   Collection Time: 03/07/19  4:22 PM  Result Value Ref Range   Glucose-Capillary 90 70 - 99 mg/dL  Glucose, capillary     Status: Abnormal   Collection Time: 03/07/19  9:12 PM  Result Value Ref Range   Glucose-Capillary 103 (H) 70 - 99 mg/dL  Glucose, capillary     Status: Abnormal   Collection Time: 03/08/19  7:57 AM  Result Value Ref Range   Glucose-Capillary 104 (H) 70 - 99 mg/dL  Comment 1 Notify RN   CBC     Status: Abnormal   Collection Time: 03/08/19  9:03 AM  Result Value Ref Range   WBC 5.3 4.0 - 10.5 K/uL   RBC 3.71 (L) 3.87 - 5.11 MIL/uL   Hemoglobin 11.5 (L) 12.0 - 15.0 g/dL   HCT 34.7 (L) 36.0 - 46.0 %   MCV 93.5 80.0 - 100.0 fL   MCH 31.0 26.0 - 34.0 pg   MCHC 33.1 30.0 - 36.0 g/dL   RDW 14.1 11.5 - 15.5 %   Platelets 204 150 - 400 K/uL   nRBC 0.0 0.0 - 0.2 %    Comment: Performed at [facility], [address] [place], [place] [address]  Basic metabolic panel     Status: Abnormal   Collection Time: 03/08/19  9:03 AM  Result Value Ref Range   Sodium 139 135 - 145 mmol/L   Potassium 3.9 3.5 - 5.1 mmol/L   Chloride 110 98 - 111 mmol/L   CO2 21 (L) 22 - 32 mmol/L   Glucose, Bld 104 (H) 70 - 99 mg/dL   BUN 22 8 - 23 mg/dL   Creatinine, Ser 1.14 (H) 0.44 - 1.00 mg/dL   Calcium 9.1 8.9 - 10.3 mg/dL   GFR calc non Af Amer 44 (L) >60 mL/min   GFR calc Af Amer 51 (L) >60 mL/min   Anion gap 8 5 - 15     Comment: Performed at [facility], [address] [place], [place] [address]  Glucose, capillary     Status: None   Collection Time: 03/08/19 12:10 PM  Result Value Ref Range   Glucose-Capillary 98 70 - 99 mg/dL  Glucose, capillary     Status: Abnormal   Collection Time: 03/08/19  4:39 PM  Result Value Ref Range   Glucose-Capillary 147 (H) 70 - 99 mg/dL    Current Facility-Administered Medications  Medication Dose Route Frequency Provider Last Rate Last Dose  . 0.9 %  sodium chloride infusion   Intravenous Continuous Gouru, Aruna, MD 50 mL/hr at 03/08/19 1254    . calcium-vitamin D (OSCAL WITH D) 500-200 MG-UNIT per tablet 1 tablet  1 tablet Oral BID Nicholes Mango, MD   1 tablet at 03/08/19 0943  . docusate sodium (COLACE) capsule 100 mg  100 mg Oral Daily PRN Gouru, Aruna, MD      . enalapril (VASOTEC) tablet 10 mg  10 mg Oral BID Gouru, Aruna, MD   10 mg at 03/08/19 0943  . enoxaparin (LOVENOX) injection 40 mg  40 mg Subcutaneous Q24H Gouru, Aruna, MD   40 mg at 03/08/19 1253  . [START ON 03/09/2019] hydrochlorothiazide (HYDRODIURIL) tablet 25 mg  25 mg Oral Daily Mayo, Pete Pelt, MD      . insulin aspart (novoLOG) injection 0-5 Units  0-5 Units Subcutaneous QHS Gouru, Aruna, MD      . insulin aspart (novoLOG) injection 0-9 Units  0-9 Units Subcutaneous TID WC Gouru, Aruna, MD   1 Units at 03/07/19 1224  . loperamide (IMODIUM) capsule 4 mg  4 mg Oral PRN Seals, Levada Dy H, NP      . metoprolol succinate (TOPROL-XL) 24 hr tablet 50 mg  50 mg Oral Daily Gouru, Aruna, MD   50 mg at 03/08/19 0943  . ondansetron (ZOFRAN) tablet 4 mg  4 mg Oral Q6H PRN Gouru, Aruna, MD       Or  . ondansetron (ZOFRAN) injection 4 mg  4 mg Intravenous  Q6H PRN Gouru, Aruna, MD      . pantoprazole (PROTONIX) EC tablet 40 mg  40 mg Oral Daily Gouru, Aruna, MD   40 mg at 03/08/19 0944  . polyethylene glycol (MIRALAX / GLYCOLAX) packet 17 g  17 g Oral Daily Gouru, Aruna, MD   17 g at 03/07/19 1540  . vitamin  B-12 (CYANOCOBALAMIN) tablet 1,000 mcg  1,000 mcg Oral Daily Gouru, Aruna, MD   1,000 mcg at 03/08/19 2330    Musculoskeletal: Strength & Muscle Tone: decreased Gait & Station: did not witness Patient leans: N/A  Psychiatric Specialty Exam: Physical Exam  Nursing note and vitals reviewed. Constitutional: She is oriented to person, place, and time. She appears well-developed and well-nourished.  HENT:  Head: Normocephalic.  Neck: Normal range of motion.  Respiratory: Effort normal.  Musculoskeletal: Normal range of motion.  Neurological: She is alert and oriented to person, place, and time.  Psychiatric: Her speech is normal and behavior is normal. Her affect is blunt. Cognition and memory are normal. She expresses impulsivity. She exhibits a depressed mood. She expresses suicidal ideation. She expresses suicidal plans.    Review of Systems  Psychiatric/Behavioral: Positive for depression and suicidal ideas.  All other systems reviewed and are negative.   Blood pressure (!) 168/66, pulse 67, temperature 98.4 F (36.9 C), temperature source Oral, resp. rate 12, height 5\' 4"  (1.626 m), weight 75.8 kg, SpO2 95 %.Body mass index is 28.67 kg/m.  General Appearance: Casual  Eye Contact:  Good  Speech:  Normal Rate  Volume:  Normal  Mood:  Depressed  Affect:  Blunt  Thought Process:  Coherent and Descriptions of Associations: Intact  Orientation:  Full (Time, Place, and Person)  Thought Content:  Rumination  Suicidal Thoughts:  Yes.  with intent/plan  Homicidal Thoughts:  None  Memory:  Immediate;   Good Recent;   Good Remote;   Good  Judgement:  Poor  Insight:  Fair  Psychomotor Activity:  Decreased  Concentration:  Concentration: Fair and Attention Span: Fair  Recall:  [place] of Knowledge:  Good  Language:  Good  Akathisia:  No  Handed:  Right  AIMS (if indicated):     Assets:  Housing Leisure Time Physical Health Resilience Social Support  ADL's:  Intact   Cognition:  WNL  Sleep:       Treatment Plan Summary: Daily contact with patient to assess and evaluate symptoms and progress in treatment, Medication management and Plan major depressive disorder, recurrent, severe without psychosis:  -No medications started due to overdose and need to medically clear -Inpatient geriatric psychiatric admission needed  Disposition: Recommend psychiatric Inpatient admission when medically cleared., geriatric inpatient psychiatry  Waylan Boga, NP 03/08/2019 5:09 PM

## 2019-03-08 NOTE — Progress Notes (Signed)
Pt denies any suicidal ideations at this time.

## 2019-03-09 ENCOUNTER — Ambulatory Visit: Payer: Medicare HMO | Admitting: Psychiatry

## 2019-03-09 DIAGNOSIS — T43222A Poisoning by selective serotonin reuptake inhibitors, intentional self-harm, initial encounter: Secondary | ICD-10-CM

## 2019-03-09 LAB — GLUCOSE, CAPILLARY
Glucose-Capillary: 114 mg/dL — ABNORMAL HIGH (ref 70–99)
Glucose-Capillary: 132 mg/dL — ABNORMAL HIGH (ref 70–99)
Glucose-Capillary: 96 mg/dL (ref 70–99)
Glucose-Capillary: 117 mg/dL — ABNORMAL HIGH (ref 70–99)

## 2019-03-09 LAB — BASIC METABOLIC PANEL
Anion gap: 6 (ref 5–15)
BUN: 19 mg/dL (ref 8–23)
CO2: 22 mmol/L (ref 22–32)
Calcium: 9.5 mg/dL (ref 8.9–10.3)
Chloride: 111 mmol/L (ref 98–111)
Creatinine, Ser: 1.23 mg/dL — ABNORMAL HIGH (ref 0.44–1.00)
GFR calc Af Amer: 47 mL/min — ABNORMAL LOW (ref >60)
GFR calc non Af Amer: 40 mL/min — ABNORMAL LOW (ref >60)
Glucose, Bld: 110 mg/dL — ABNORMAL HIGH (ref 70–99)
Potassium: 3.8 mmol/L (ref 3.5–5.1)
Sodium: 139 mmol/L (ref 135–145)

## 2019-03-09 NOTE — Progress Notes (Addendum)
[place] at [place] NAME: Paige Dougherty    MR#:  [id number]  DATE OF BIRTH:  02/12/1933  SUBJECTIVE:   Patient has no concerns this morning.  She states that she would like to go home.  She denies any suicidal ideations or depression.  REVIEW OF SYSTEMS:  CONSTITUTIONAL: No fever, fatigue or weakness.  EYES: No blurred or double vision.  EARS, NOSE, AND THROAT: No tinnitus or ear pain.  RESPIRATORY: No cough, shortness of breath, wheezing or hemoptysis.  CARDIOVASCULAR: No chest pain, orthopnea, edema.  GASTROINTESTINAL: No nausea, vomiting, diarrhea or abdominal pain.  GENITOURINARY: No dysuria, hematuria.  ENDOCRINE: No polyuria, nocturia,  HEMATOLOGY: No anemia, easy bruising or bleeding SKIN: No rash or lesion. MUSCULOSKELETAL: No joint pain or arthritis.   NEUROLOGIC: No tingling, numbness, weakness.  PSYCHIATRY: No depression  DRUG ALLERGIES:   Allergies  Allergen Reactions  . Avapro [Irbesartan] Other (See Comments)    Other reaction(s): Unknown unknown  . Azithromycin Other (See Comments)    Extreme vaginal burning. unknown  . Buspirone Other (See Comments)    Burning sensations and felt overly hot   . Clindamycin Other (See Comments)    Vaginal burning  . Duloxetine Other (See Comments)    Hyperactivity.  . Duloxetine Hcl Other (See Comments)    Hyperactivity.  . Ezetimibe-Simvastatin Other (See Comments)    Arthralgias.  . Lipitor [Atorvastatin] Other (See Comments)    "muscle aches"  . Metronidazole Other (See Comments)  . Penicillins   . Prednisone Other (See Comments)    Dizziness/double vision  . Procaine Other (See Comments)    tremors  . Procaine Hcl     tremors  . Eggs Or Egg-Derived Products Other (See Comments)    Other Reaction: Not Assessed  . Other Other (See Comments) and Anxiety    Novacaine weakness  . Statins Rash    Arthralgias.  . Sulfa Antibiotics Rash    Vague history of a  sulfa allergy, but does not remember the reaction.  . Tetracycline Rash    VITALS:  Blood pressure (!) 157/84, pulse 90, temperature 98 F (36.7 C), temperature source Oral, resp. rate 18, height 5\' 4"  (1.626 m), weight 75.8 kg, SpO2 96 %.  PHYSICAL EXAMINATION:  GENERAL:  85 y.o.-year-old patient lying in the bed with no acute distress.  EYES: Pupils equal, round, reactive to light and accommodation. No scleral icterus. Extraocular muscles intact.  HEENT: Head atraumatic, normocephalic. Oropharynx and nasopharynx clear.  NECK:  Supple, no jugular venous distention. No thyroid enlargement, no tenderness.  LUNGS: Normal breath sounds bilaterally, no wheezing, rales,rhonchi or crepitation. No use of accessory muscles of respiration.  CARDIOVASCULAR: RRR, S1, S2 normal. No murmurs, rubs, or gallops.  ABDOMEN: Soft, nontender, nondistended. Bowel sounds present. No organomegaly or mass.  EXTREMITIES: No pedal edema, cyanosis, or clubbing.  NEUROLOGIC: Cranial nerves II through XII are intact.  No focal deficits.  Sensation intact. Gait not checked.  PSYCHIATRIC: The patient is alert and oriented x 3. + Somewhat flat affect SKIN: No obvious rash, lesion, or ulcer.    LABORATORY PANEL:   CBC Recent Labs  Lab 03/08/19 0903  WBC 5.3  HGB 11.5*  HCT 34.7*  PLT 204   ------------------------------------------------------------------------------------------------------------------  Chemistries  Recent Labs  Lab 03/06/19 1934  03/09/19 0424  NA 138   < > 139  K 4.1   < > 3.8  CL 108   < > 111  CO2 24   < > 22  GLUCOSE 118*   < > 110*  BUN 32*   < > 19  CREATININE 1.24*   < > 1.23*  CALCIUM 9.4   < > 9.5  AST 19  --   --   ALT 18  --   --   ALKPHOS 67  --   --   BILITOT 0.7  --   --    < > = values in this interval not displayed.   ------------------------------------------------------------------------------------------------------------------  Cardiac Enzymes No results  for input(s): TROPONINI in the last 168 hours. ------------------------------------------------------------------------------------------------------------------  RADIOLOGY:  No results found.  EKG:   Orders placed or performed during the hospital encounter of 03/06/19  . ED EKG  . ED EKG  . EKG 12-Lead  . EKG 12-Lead  . EKG 12-Lead  . EKG 12-Lead  . EKG 12-Lead  . EKG 12-Lead  . EKG 12-Lead  . EKG 12-Lead  . EKG 12-Lead  . EKG 12-Lead  . EKG 12-Lead  . EKG 12-Lead  . EKG 12-Lead  . EKG 12-Lead  . EKG 12-Lead  . EKG 12-Lead  . EKG 12-Lead  . EKG 12-Lead  . EKG 12-Lead  . EKG 12-Lead  . EKG 12-Lead  . EKG 12-Lead  . EKG 12-Lead  . EKG 12-Lead  . EKG 12-Lead  . EKG 12-Lead  . EKG 12-Lead  . EKG 12-Lead  . EKG 12-Lead  . EKG 12-Lead  . EKG 12-Lead  . EKG 12-Lead  . EKG 12-Lead  . EKG 12-Lead  . EKG 12-Lead  . EKG 12-Lead  . EKG 12-Lead  . EKG 12-Lead    ASSESSMENT AND PLAN:   Intentional drug overdose with Celexa, Ativan, melatonin, and Remeron -Psych consulted-recommended inpatient geri psych admission -Patient is medically cleared for discharge to inpatient psych bed -Continue IVC and one-on-one sitter -Holding home psych meds due to overdose  AKI- creatinine bumped after restarting home HCTZ yesterday -Continue IV fluids -Will recheck creatinine tomorrow  Type 2 diabetes-blood sugars controlled -Continue moderate sliding scale insulin  Essential hypertension- BP elevated today -Continue home BP meds  All the records are reviewed and case discussed with Care Management/Social Workerr. Management plans discussed with the patient, called  daughter [phone] -left a vm to call back for an update   CODE STATUS: Full  TOTAL TIME TAKING CARE OF THIS PATIENT: 35 minutes.   Patient is awaiting geri psych bed.  Note: This dictation was prepared with Dragon dictation along with smaller phrase technology. Any transcriptional errors that  result from this process are unintentional.   Evette Doffing M.D on 03/09/2019 at 12:16 PM  Between 7am to 6pm - Pager - [phone]  After 6pm go to [email or web address] - password [email or web address] [facility]  Office  [phone]  CC: Primary care physician; [place]

## 2019-03-09 NOTE — TOC Progression Note (Signed)
Transition of Care [facility]) - Progression Note    Patient Details  Name: Paige Dougherty MRN: [id number] Date of Birth: 07-13-1933  Transition of Care [facility]) CM/SW [place], RN Phone Number: 03/09/2019, 4:03 PM  Clinical Narrative:    Faxed referral, H&P Psych notes to all [place] a call with a bed offer        Expected Discharge Plan and Services                                                 Social Determinants of Health (SDOH) Interventions    Readmission Risk Interventions No flowsheet data found.

## 2019-03-09 NOTE — Progress Notes (Signed)
Daughter called to check on patient status today. Explained that pt remains stable, no distress. Case management waiting on bed placement.

## 2019-03-09 NOTE — TOC Initial Note (Signed)
Transition of Care [facility]) - Initial/Assessment Note    Patient Details  Name: Paige Dougherty MRN: [id number] Date of Birth: May 05, 1933  Transition of Care [facility]) CM/SW Contact:    Su Hilt, RN Phone Number: 03/09/2019, 9:01 AM  Clinical Narrative:                  Patient lives at home with her husband and her daughter checks on her regularly  She feels hopeless and depressed and is IVC, The recommendation is inpatient geriatric Psych, I called [facility] and left a detailed secure VM requesting a call back to see if they have a bed     Patient Goals and CMS Choice        Expected Discharge Plan and Services                                                Prior Living Arrangements/Services                       Activities of Daily Living Home Assistive Devices/Equipment: Dentures (specify type), Walker (specify type) ADL Screening (condition at time of admission) Patient's cognitive ability adequate to safely complete daily activities?: Yes Is the patient deaf or have difficulty hearing?: No Does the patient have difficulty seeing, even when wearing glasses/contacts?: No Does the patient have difficulty concentrating, remembering, or making decisions?: No Patient able to express need for assistance with ADLs?: Yes Does the patient have difficulty dressing or bathing?: No Independently performs ADLs?: Yes (appropriate for developmental age) Does the patient have difficulty walking or climbing stairs?: Yes Weakness of Legs: Right Weakness of Arms/Hands: None  Permission Sought/Granted                  Emotional Assessment              Admission diagnosis:  Intentional drug overdose, initial encounter [facility]) [T50.902A] Patient Active Problem List   Diagnosis Date Noted  . Overdose, intentional self-harm, initial encounter ([place]) 03/07/2019  . Major depressive disorder, recurrent severe without psychotic features  ([place]) 03/07/2019  . GAD (generalized anxiety disorder) 01/20/2019  . Panic attacks 01/20/2019  . Breast cancer of upper-inner quadrant of right female breast ([place]) 01/22/2016  . Chronic gouty arthritis 03/14/2015  . Endocrinal hypertension 03/14/2015  . Fast heart beat 03/14/2015  . Electrolyte imbalance 03/14/2015  . H/O malignant neoplasm of breast 03/14/2015  . Malignant neoplastic disease ([place]) 03/11/2015  . Adaptive colitis 03/11/2015  . Type 2 diabetes mellitus ([place]) 03/11/2015  . Overdose of benzodiazepine 03/05/2015  . Overdose of antidepressant 03/05/2015  . Overdose of opiate or related narcotic ([place]) 03/05/2015  . Suicide attempt ([place]) 03/05/2015  . Acute respiratory failure with hypoxemia ([place]) 03/05/2015  . Cancer of breast ([place]) 12/24/2014  . Urinary incontinence   . Fibrocystic breast disease   . Chronic headache   . Benign essential HTN 02/22/2014  . Diabetes mellitus ([place]) 02/22/2014  . Anxiety 04/11/2009  . Depression 04/11/2009  . ESOPHAGEAL STRICTURE 04/11/2009  . GERD 04/11/2009  . DIVERTICULOSIS, COLON 04/11/2009  . IRRITABLE BOWEL SYNDROME 04/11/2009  . ABDOMINAL PAIN, UNSPECIFIED SITE 04/11/2009   PCP:  [facility]:   [place], [address] [address] Phone: [phone]  Fax: [phone]  [id number] [address], [place] [address] [place] [address] Phone: [phone] Fax: [phone]  [id number] [place], [address] [place] [address] Phone: [phone] Fax: [phone]     Social Determinants of Health (SDOH) Interventions    Readmission Risk Interventions No flowsheet data found.

## 2019-03-10 DIAGNOSIS — F332 Major depressive disorder, recurrent severe without psychotic features: Secondary | ICD-10-CM

## 2019-03-10 DIAGNOSIS — T43222A Poisoning by selective serotonin reuptake inhibitors, intentional self-harm, initial encounter: Secondary | ICD-10-CM

## 2019-03-10 LAB — GLUCOSE, CAPILLARY
Glucose-Capillary: 128 mg/dL — ABNORMAL HIGH (ref 70–99)
Glucose-Capillary: 107 mg/dL — ABNORMAL HIGH (ref 70–99)
Glucose-Capillary: 111 mg/dL — ABNORMAL HIGH (ref 70–99)
Glucose-Capillary: 133 mg/dL — ABNORMAL HIGH (ref 70–99)

## 2019-03-10 LAB — BASIC METABOLIC PANEL
Anion gap: 8 (ref 5–15)
BUN: 20 mg/dL (ref 8–23)
CO2: 23 mmol/L (ref 22–32)
Calcium: 9.2 mg/dL (ref 8.9–10.3)
Chloride: 108 mmol/L (ref 98–111)
Creatinine, Ser: 1.17 mg/dL — ABNORMAL HIGH (ref 0.44–1.00)
GFR calc Af Amer: 50 mL/min — ABNORMAL LOW (ref >60)
GFR calc non Af Amer: 43 mL/min — ABNORMAL LOW (ref >60)
Glucose, Bld: 119 mg/dL — ABNORMAL HIGH (ref 70–99)
Potassium: 3.6 mmol/L (ref 3.5–5.1)
Sodium: 139 mmol/L (ref 135–145)

## 2019-03-10 MED ORDER — HYDRALAZINE HCL 25 MG PO TABS
25.0000 mg | ORAL_TABLET | Freq: Three times a day (TID) | ORAL | Status: DC
  Administered 2019-03-10 – 2019-03-11 (×3): 25 mg via ORAL
  Filled 2019-03-10 (×3): qty 1

## 2019-03-10 MED ORDER — ACETAMINOPHEN 325 MG PO TABS
650.0000 mg | ORAL_TABLET | Freq: Four times a day (QID) | ORAL | Status: DC | PRN
  Administered 2019-03-10: 650 mg via ORAL
  Filled 2019-03-10: qty 2

## 2019-03-10 MED ORDER — ALPRAZOLAM 0.5 MG PO TABS
0.5000 mg | ORAL_TABLET | ORAL | Status: AC
  Administered 2019-03-10: 0.5 mg via ORAL
  Filled 2019-03-10: qty 1

## 2019-03-10 NOTE — TOC Progression Note (Signed)
Transition of Care [facility]) - Progression Note    Patient Details  Name: Paige Dougherty MRN: [id number] Date of Birth: 05/30/1933  Transition of Care [facility]) CM/SW [place], RN Phone Number: 03/10/2019, 9:19 AM  Clinical Narrative:     Refaxed the referral to the geriatric Facilities in [place] that  fax failed yesterday       Expected Discharge Plan and Services                                                 Social Determinants of Health (SDOH) Interventions    Readmission Risk Interventions No flowsheet data found.

## 2019-03-10 NOTE — Progress Notes (Signed)
[place] at [place] NAME: Paige Dougherty    MR#:  [id number]  DATE OF BIRTH:  1933/04/22  SUBJECTIVE:   Very anxious, shaking today, asking Xanax.  Appreciate psychiatry following the patient.  REVIEW OF SYSTEMS:  CONSTITUTIONAL: No fever, fatigue or weakness.  EYES: No blurred or double vision.  EARS, NOSE, AND THROAT: No tinnitus or ear pain.  RESPIRATORY: No cough, shortness of breath, wheezing or hemoptysis.  CARDIOVASCULAR: No chest pain, orthopnea, edema.  GASTROINTESTINAL: No nausea, vomiting, diarrhea or abdominal pain.  GENITOURINARY: No dysuria, hematuria.  ENDOCRINE: No polyuria, nocturia,  HEMATOLOGY: No anemia, easy bruising or bleeding SKIN: No rash or lesion. MUSCULOSKELETAL: No joint pain or arthritis.   NEUROLOGIC: Patient is depressed, shaking. DRUG ALLERGIES:   Allergies  Allergen Reactions  . Avapro [Irbesartan] Other (See Comments)    Other reaction(s): Unknown unknown  . Azithromycin Other (See Comments)    Extreme vaginal burning. unknown  . Buspirone Other (See Comments)    Burning sensations and felt overly hot   . Clindamycin Other (See Comments)    Vaginal burning  . Duloxetine Other (See Comments)    Hyperactivity.  . Duloxetine Hcl Other (See Comments)    Hyperactivity.  . Ezetimibe-Simvastatin Other (See Comments)    Arthralgias.  . Lipitor [Atorvastatin] Other (See Comments)    "muscle aches"  . Metronidazole Other (See Comments)  . Penicillins   . Prednisone Other (See Comments)    Dizziness/double vision  . Procaine Other (See Comments)    tremors  . Procaine Hcl     tremors  . Eggs Or Egg-Derived Products Other (See Comments)    Other Reaction: Not Assessed  . Other Other (See Comments) and Anxiety    Novacaine weakness  . Statins Rash    Arthralgias.  . Sulfa Antibiotics Rash    Vague history of a sulfa allergy, but does not remember the reaction.  . Tetracycline Rash     VITALS:  Blood pressure (!) 142/105, pulse 64, temperature 97.6 F (36.4 C), temperature source Oral, resp. rate 18, height 5\' 4"  (1.626 m), weight 75.8 kg, SpO2 97 %.  PHYSICAL EXAMINATION:  GENERAL:  85 y.o.-year-old patient lying in the bed with no acute distress.  EYES: Pupils equal, round, reactive to light and accommodation. No scleral icterus. Extraocular muscles intact.  HEENT: Head atraumatic, normocephalic. Oropharynx and nasopharynx clear.  NECK:  Supple, no jugular venous distention. No thyroid enlargement, no tenderness.  LUNGS: Normal breath sounds bilaterally, no wheezing, rales,rhonchi or crepitation. No use of accessory muscles of respiration.  CARDIOVASCULAR: RRR, S1, S2 normal. No murmurs, rubs, or gallops.  ABDOMEN: Soft, nontender, nondistended. Bowel sounds present. No organomegaly or mass.  EXTREMITIES: No pedal edema, cyanosis, or clubbing.  NEUROLOGIC: Cranial nerves II through XII are intact.  No focal deficits.  Sensation intact. Gait not checked.  PSYCHIATRIC: Very anxious, requesting Xanax, sKIN: No obvious rash, lesion, or ulcer.    LABORATORY PANEL:   CBC Recent Labs  Lab 03/08/19 0903  WBC 5.3  HGB 11.5*  HCT 34.7*  PLT 204   ------------------------------------------------------------------------------------------------------------------  Chemistries  Recent Labs  Lab 03/06/19 1934  03/10/19 0335  NA 138   < > 139  K 4.1   < > 3.6  CL 108   < > 108  CO2 24   < > 23  GLUCOSE 118*   < > 119*  BUN 32*   < > 20  CREATININE  1.24*   < > 1.17*  CALCIUM 9.4   < > 9.2  AST 19  --   --   ALT 18  --   --   ALKPHOS 67  --   --   BILITOT 0.7  --   --    < > = values in this interval not displayed.   ------------------------------------------------------------------------------------------------------------------  Cardiac Enzymes No results for input(s): TROPONINI in the last 168  hours. ------------------------------------------------------------------------------------------------------------------  RADIOLOGY:  No results found.  EKG:   Orders placed or performed during the hospital encounter of 03/06/19  . ED EKG  . ED EKG  . EKG 12-Lead  . EKG 12-Lead  . EKG 12-Lead  . EKG 12-Lead  . EKG 12-Lead  . EKG 12-Lead  . EKG 12-Lead  . EKG 12-Lead  . EKG 12-Lead  . EKG 12-Lead  . EKG 12-Lead  . EKG 12-Lead  . EKG 12-Lead  . EKG 12-Lead  . EKG 12-Lead  . EKG 12-Lead  . EKG 12-Lead  . EKG 12-Lead  . EKG 12-Lead  . EKG 12-Lead  . EKG 12-Lead  . EKG 12-Lead  . EKG 12-Lead  . EKG 12-Lead  . EKG 12-Lead  . EKG 12-Lead  . EKG 12-Lead  . EKG 12-Lead  . EKG 12-Lead  . EKG 12-Lead  . EKG 12-Lead  . EKG 12-Lead  . EKG 12-Lead  . EKG 12-Lead  . EKG 12-Lead  . EKG 12-Lead  . EKG 12-Lead  . EKG 12-Lead  . EKG 12-Lead  . EKG 12-Lead  . EKG 12-Lead  . EKG 12-Lead  . EKG 12-Lead  . EKG 12-Lead  . EKG 12-Lead  . EKG 12-Lead    ASSESSMENT AND PLAN:   Intentional drug overdose with Celexa, Ativan, melatonin, and Remeron -Psych consulted-recommended inpatient geri psych admission -Patient is medically cleared for discharge to inpatient psych bed -Continue IVC and one-on-one sitter -Holding home psych meds due to overdose Appreciate psychiatry following the patient regarding further dose of Xanax or adjustment of psychiatric medicines.  Patient told me that she saw a new psychiatrist recently, she is trying to wean off Xanax, patient has no stress at home but she feels like she is overwhelmed, she and her husband had a stroke early part of this year and she says she cannot take care of it anymore and feels stressed.  AKI-improved.  Encourage p.o. intake.  Stop IV fluids. Type 2 diabetes-blood sugars controlled -Continue moderate sliding scale insulin  Essential hypertension BP elevated secondary to anxiety, patient is very anxious, continue  metoprolol, Toprol-XL 50 mg daily, also on enalapril, HCTZ discontinued because of acute kidney injury, will add hydralazine. Major depression with suicidal ideation, patient was taking Seroquel, Celexa, Ativan at home appreciate psychiatry following, restarting some of the medicines to help her with anxiety.   All the records are reviewed and case discussed with Care Management/Social Workerr. Management plans discussed with the patient, called  daughter [phone] -left a vm to call back for an update   CODE STATUS: Full  TOTAL TIME TAKING CARE OF THIS PATIENT: 40 minutes.  For than 50% time spent in counseling, coordination of care Patient is awaiting geri psych bed.  Note: This dictation was prepared with Dragon dictation along with smaller phrase technology. Any transcriptional errors that result from this process are unintentional.   Epifanio Lesches M.D on 03/10/2019 at 11:37 AM  Between 7am to 6pm - Pager - [phone]  After 6pm go to [email or web address] -  password [email or web address] [facility]  [place] [facility]  Office  [phone]  CC: Primary care physician; [place]

## 2019-03-10 NOTE — TOC Progression Note (Signed)
Transition of Care [facility]) - Progression Note    Patient Details  Name: Paige Dougherty MRN: [id number] Date of Birth: [birth year]  Transition of Care [facility]) CM/SW Contact  Marlowe Lawes, Lenice Llamas Phone Number: [phone]  03/10/2019, 4:14 PM  Clinical Narrative: Clinical Social Worker ([place]) contacted patient's daughter [name] and made her aware that patient has been accepted at the inpatient geriatric psych facility [facility] in [place], [place] made daughter aware that [facility] sheriff's department will transport patient to that facility tomorrow morning. Per daughter she will bring patient's HPOA paper work and some clothes to [facility] this evening so it can go with patient to that facility. Charge RN aware of above. CSW will continue to follow and assist as needed.           Expected Discharge Plan and Services                                                 Social Determinants of Health (SDOH) Interventions    Readmission Risk Interventions No flowsheet data found.

## 2019-03-10 NOTE — TOC Progression Note (Signed)
Transition of Care [facility]) - Progression Note    Patient Details  Name: RHEALYN CULLEN MRN: [id number] Date of Birth: 1932/08/09  Transition of Care [facility]) CM/SW Contact  Su Hilt, RN Phone Number: 03/10/2019, 11:56 AM  Clinical Narrative:     Received a call from [facility] They have a bed available and are offering The room number is 637 Report can be called to [phone] Accepting physician is Kristie Cowman Address is [address] in [place] [address] [place] Sheriff office to [facility] in [place] called and then stated she would call me right back, call came from [phone].  Awaiting call back       Expected Discharge Plan and Services                                                 Social Determinants of Health (SDOH) Interventions    Readmission Risk Interventions No flowsheet data found.

## 2019-03-10 NOTE — Evaluation (Signed)
Physical Therapy Evaluation Patient Details Name: Paige Dougherty MRN: [id number] DOB: [birth year] Today's Date: 03/10/2019   History of Present Illness  From MD H&P:  Pt is an 85 y.o. female with a known history of depression, breast cancer status post right lumpectomy in 2016, diabetes mellitus, GERD, hyperlipidemia, hypertension.  She presented to the emergency room with reported overdose of several medications.  Urine drug screen was positive for benzodiazepines and tricyclics.  IVC was initiated by the ED physician as patient reports she was trying to kill herself.  She reports a history of depression with her husband having recent had a stroke.  Psychiatric services were consulted by the ED physician.  Assessment includes: Intentional drug overdose, AKI, Essential hypertension with BP elevated secondary to anxiety.    Clinical Impression  Pt presents with deficits in strength, transfers, mobility, gait, balance, and activity tolerance.  Pt anxious during the session requiring encouragement several times for full participation.  Pt was Mod Ind with bed mobility tasks and SBA with transfers.  Pt demonstrated good concentric control during sit to stand but required UE assist to control descent during stand to sit.  Pt was able to amb 30' with a RW and CGA with cues for amb closer to the RW for safety.  Pt endorsed feeling unsteady/shaky while ambulating and reported self-limiting activity at home secondary to a fear of falling.  Pt will benefit from HHPT services upon discharge to safely address above deficits for decreased caregiver assistance and decreased risk of further functional decline.      Follow Up Recommendations Home health PT;Supervision for mobility/OOB    Equipment Recommendations  Rolling walker with 5" wheels    Recommendations for Other Services       Precautions / Restrictions Precautions Precautions: Fall Restrictions Weight Bearing Restrictions: No      Mobility  Bed  Mobility Overal bed mobility: Modified Independent             General bed mobility comments: Extra time and effort required but no physical assistance needed  Transfers Overall transfer level: Needs assistance Equipment used: Rolling walker (2 wheeled) Transfers: Sit to/from Stand Sit to Stand: Supervision         General transfer comment: Good concentric control and fair eccentric control with the need of UE support to control descent  Ambulation/Gait Ambulation/Gait assistance: Min guard Gait Distance (Feet): 30 Feet Assistive device: Rolling walker (2 wheeled) Gait Pattern/deviations: Step-through pattern;Decreased step length - right;Decreased step length - left     General Gait Details: Slow cadence with short B step length with cues for amb closer to the [employer] Mobility    Modified Rankin (Stroke Patients Only)       Balance Overall balance assessment: Needs assistance   Sitting balance-Leahy Scale: Good     Standing balance support: Bilateral upper extremity supported Standing balance-Leahy Scale: Good                               Pertinent Vitals/Pain Pain Assessment: No/denies pain(Pt reports a h/o B knee pain but no signs of distress during the session)    Home Living Family/patient expects to be discharged to:: Private residence Living Arrangements: Spouse/significant other;Children Available Help at Discharge: Family;Available 24 hours/day Type of Home: House Home Access: Stairs to enter Entrance Stairs-Rails: Right Entrance Stairs-Number of Steps: 6 Home Layout:  One level Home Equipment: Grab bars - tub/shower;Walker - standard      Prior Function Level of Independence: Independent with assistive device(s)         Comments: Ind amb in the home without an AD, SW in the community, Ind with ADLs, no history of falling but pt does endorse a fear of falling that somewhat limits her activity.      Hand Dominance        Extremity/Trunk Assessment   Upper Extremity Assessment Upper Extremity Assessment: Generalized weakness    Lower Extremity Assessment Lower Extremity Assessment: Generalized weakness       Communication   Communication: No difficulties  Cognition Arousal/Alertness: Awake/alert Behavior During Therapy: WFL for tasks assessed/performed Overall Cognitive Status: Within Functional Limits for tasks assessed                                 General Comments: Pt anxious during the session but participated throughout      General Comments      Exercises Total Joint Exercises Long Arc Quad: AROM;Both;10 reps Knee Flexion: AROM;Both;10 reps   Assessment/Plan    PT Assessment Patient needs continued PT services  PT Problem List Decreased strength;Decreased activity tolerance;Decreased balance;Decreased mobility       PT Treatment Interventions DME instruction;Gait training;Stair training;Functional mobility training;Therapeutic activities;Balance training;Therapeutic exercise;Patient/family education    PT Goals (Current goals can be found in the Care Plan section)  Acute Rehab PT Goals Patient Stated Goal: Improved balance and confidence with walking PT Goal Formulation: With patient Time For Goal Achievement: 03/23/19 Potential to Achieve Goals: Good    Frequency Min 2X/week   Barriers to discharge        Co-evaluation               AM-PAC PT "6 Clicks" Mobility  Outcome Measure Help needed turning from your back to your side while in a flat bed without using bedrails?: None Help needed moving from lying on your back to sitting on the side of a flat bed without using bedrails?: None Help needed moving to and from a bed to a chair (including a wheelchair)?: A Little Help needed standing up from a chair using your arms (e.g., wheelchair or bedside chair)?: A Little Help needed to walk in hospital room?: A  Little Help needed climbing 3-5 steps with a railing? : A Little 6 Click Score: 20    End of Session Equipment Utilized During Treatment: Gait belt Activity Tolerance: Patient tolerated treatment well Patient left: in chair;with call bell/phone within reach;with nursing/sitter in room(Pt with sitter who stated no chair alarm required) Nurse Communication: Mobility status PT Visit Diagnosis: Difficulty in walking, not elsewhere classified (R26.2);Muscle weakness (generalized) (M62.81)    Time: 2751-7001 PT Time Calculation (min) (ACUTE ONLY): 26 min   Charges:   PT Evaluation $PT Eval Moderate Complexity: 1 Mod          D. Scott Asmaa Tirpak PT, DPT 03/10/19, 5:00 PM

## 2019-03-10 NOTE — Progress Notes (Signed)
Due to continued suicidal ideation psychiatric recommends to continue IVC, so patient will be continued on IVC with suicide precautions.  Will assess tomorrow and decide if she can be discharged safely.

## 2019-03-10 NOTE — Plan of Care (Signed)

## 2019-03-10 NOTE — Consult Note (Addendum)
[facility] Consult   Reason for Consult:  Suicide attempt Referring Physician:  Hospitalist Patient Identification: Paige Dougherty MRN:  [id number] Principal Diagnosis: Major depressive disorder, recurrent severe without psychotic features ([place]) Diagnosis:  Principal Problem:   Major depressive disorder, recurrent severe without psychotic features ([place]) Active Problems:   Overdose, intentional self-harm, initial encounter ([place])   Total Time spent with patient: 30 minutes  Subjective:   Paige Dougherty is a 85 y.o. female patient reports that she is doing good today.  Patient continues to endorse that she feels very depressed and that she is continued to have some suicidal thoughts.  She states that she feels sorry that she did not die.  She states that she wishes that she would have completed the suicide attempt.  When questioning patient about her medications she is a very poor historian and tells me to contact her daughter Paige Dougherty who is also her healthcare power of attorney.  Patient's daughter, Paige Dougherty, is contacted at [phone].  She reports that she does take care of her mom and she helps with her medications.  She states she goes by other daily and puts her medications in the pillboxes.  She reports that her mother is followed by Dr. Shea Evans at the [facility].  She reports that she has been working with her medications and confirmed that the patient was taking Seroquel 37.5 mg p.o. nightly, Celexa 40 mg p.o. daily which was increased to 40 mg on July 1 and had previously been on Celexa 20 mg for approximately 2 years.  She also reports that she was on Ativan 0.25 mg p.o. twice daily and 0.5 mg p.o. nightly.  She does report that Dr. Shea Evans was going to discontinue the Ativan and was tapering the patient off.  Patient daughter states that she does not want her mother to stay on any benzodiazepines due to the side effects and the concern further patient becoming more  confused as she gets older.  HPI:  Per TTS on admission to the ED:  Paige Dougherty an 85 y.o.female.The pt came in after overdosing on 10-20 pills. The pt stated, "Why can't I just die? I thought it was the right time" She stated she is worried about everything, such as her husbands health. She has had a previous attempt about 10 years ago of overdosing. She is seeing a psychiatrist at [facility].  Patient is seen by this provider face-to-face.  Patient presents with a flat affect and appears to be depressed and continued to endorse suicidal ideations and reports that she wishes that she would have died at the suicide attempt of overdosing.  Per chart review patient is going to be discharged to go to a geropsychiatric facility tomorrow morning.  Patient is currently IVCd.  I was informed that the patient cannot discharge to the facility if she remains under IVC and continue the IVC for the patient to transported to go to the geropsychiatric facility  After reviewing patient's medications would recommend restarting the Celexa at 20 mg p.o. daily and to restart Seroquel at 25 mg p.o. nightly.  Would not recommend starting back benzodiazepines at standing doses.  However, due to the possibility of some minor withdrawal symptoms after 3 days would use gabapentin 100 mg p.o. 3 times daily or could use a very low-dose of Librium every 6 hours for a CIWA greater than 10.  Reviewed patient's labs and her AST and ALT were within normal limits on 03/06/2019.  Past Psychiatric History: MDD, anxiety, panic attacks, reported previous suicide attempt  Risk to Self: Suicidal Ideation: Yes-Currently Present Suicidal Intent: Yes-Currently Present Is patient at risk for suicide?: Yes Suicidal Plan?: Yes-Currently Present Specify Current Suicidal Plan: OD on pills Access to Means: Yes Specify Access to Suicidal Means: has pills What has been your use of drugs/alcohol within the last 12 months?:  none How many times?: 2 Other Self Harm Risks: none Triggers for Past Attempts: Unpredictable Intentional Self Injurious Behavior: None Risk to Others: Homicidal Ideation: No Thoughts of Harm to Others: No Current Homicidal Intent: No Current Homicidal Plan: No Access to Homicidal Means: No Identified Victim: pt denies History of harm to others?: No Assessment of Violence: None Noted Violent Behavior Description: none Does patient have access to weapons?: No Criminal Charges Pending?: No Does patient have a court date: No Prior Inpatient Therapy: Prior Inpatient Therapy: Yes Prior Therapy Dates: 2010 Prior Therapy Facilty/Provider(s): [facility] Reason for Treatment: SI Prior Outpatient Therapy: Prior Outpatient Therapy: Yes Prior Therapy Dates: current Prior Therapy Facilty/Provider(s): [facility] Reason for Treatment: depression Does patient have an ACCT team?: No Does patient have Intensive In-House Services?  : No Does patient have [facility] services? : No Does patient have P4CC services?: No  Past Medical History:  Past Medical History:  Diagnosis Date  . Allergy   . Anxiety   . Arthritis    HANDS/FEET  . Barrett's esophagus   . Breast cancer ([place]) 2016   RIGHT lumpectomy 2016 INVASIVE MAMMARY CARCINOMA   . Cancer of breast ([place]) 12/24/2014   radiation- Right  . Chronic headache   . Depression   . Diabetes mellitus   . Diverticulosis   . Esophageal stricture   . Fibrocystic breast disease   . GERD (gastroesophageal reflux disease)   . Hyperlipemia   . Hypertension   . IBS (irritable bowel syndrome)   . Personal history of radiation therapy 2016   RIGHT lumpectomy    INVASIVE MAMMARY CARCINOMA   . Urinary incontinence     Past Surgical History:  Procedure Laterality Date  . ABDOMINAL HYSTERECTOMY    . BREAST BIOPSY Bilateral    core bxs  . BREAST EXCISIONAL BIOPSY Right 2016   +  . BREAST LUMPECTOMY Right 2016   INVASIVE MAMMARY CARCINOMA   .  CARPAL TUNNEL RELEASE     bilateral  . CATARACT EXTRACTION     bilateral   . COLONOSCOPY    . DILATION AND CURETTAGE OF UTERUS    . finger cyst removal    . neck cyst removal    . UPPER GASTROINTESTINAL ENDOSCOPY     Family History:  Family History  Problem Relation Age of Onset  . Colon cancer Father   . Thyroid disease Father   . Thyroid disease Mother   . Hypertension Mother   . Alzheimer's disease Mother   . Colon cancer Maternal Grandmother   . Breast cancer Maternal Grandmother   . Anxiety disorder Daughter    Family Psychiatric  History: See above Social History:  Social History   Substance and Sexual Activity  Alcohol Use Yes   Comment: occasional     Social History   Substance and Sexual Activity  Drug Use No    Social History   Socioeconomic History  . Marital status: Married    Spouse name: Not on file  . Number of children: Not on file  . Years of education: Not on file  . Highest education level:  Not on file  Occupational History  . Not on file  Social Needs  . Financial resource strain: Not on file  . Food insecurity    Worry: Not on file    Inability: Not on file  . Transportation needs    Medical: Not on file    Non-medical: Not on file  Tobacco Use  . Smoking status: Never Smoker  . Smokeless tobacco: Never Used  Substance and Sexual Activity  . Alcohol use: Yes    Comment: occasional  . Drug use: No  . Sexual activity: Not on file  Lifestyle  . Physical activity    Days per week: Not on file    Minutes per session: Not on file  . Stress: Not on file  Relationships  . Social Herbalist on phone: Not on file    Gets together: Not on file    Attends religious service: Not on file    Active member of club or organization: Not on file    Attends meetings of clubs or organizations: Not on file    Relationship status: Not on file  Other Topics Concern  . Not on file  Social History Narrative  . Not on file    Additional Social History:    Allergies:   Allergies  Allergen Reactions  . Avapro [Irbesartan] Other (See Comments)    Other reaction(s): Unknown unknown  . Azithromycin Other (See Comments)    Extreme vaginal burning. unknown  . Buspirone Other (See Comments)    Burning sensations and felt overly hot   . Clindamycin Other (See Comments)    Vaginal burning  . Duloxetine Other (See Comments)    Hyperactivity.  . Duloxetine Hcl Other (See Comments)    Hyperactivity.  . Ezetimibe-Simvastatin Other (See Comments)    Arthralgias.  . Lipitor [Atorvastatin] Other (See Comments)    "muscle aches"  . Metronidazole Other (See Comments)  . Penicillins   . Prednisone Other (See Comments)    Dizziness/double vision  . Procaine Other (See Comments)    tremors  . Procaine Hcl     tremors  . Eggs Or Egg-Derived Products Other (See Comments)    Other Reaction: Not Assessed  . Other Other (See Comments) and Anxiety    Novacaine weakness  . Statins Rash    Arthralgias.  . Sulfa Antibiotics Rash    Vague history of a sulfa allergy, but does not remember the reaction.  . Tetracycline Rash    Labs:  Results for orders placed or performed during the hospital encounter of 03/06/19 (from the past 48 hour(s))  Glucose, capillary     Status: Abnormal   Collection Time: 03/08/19  4:39 PM  Result Value Ref Range   Glucose-Capillary 147 (H) 70 - 99 mg/dL  Glucose, capillary     Status: Abnormal   Collection Time: 03/08/19  9:18 PM  Result Value Ref Range   Glucose-Capillary 122 (H) 70 - 99 mg/dL   Comment 1 Notify RN   Basic metabolic panel     Status: Abnormal   Collection Time: 03/09/19  4:24 AM  Result Value Ref Range   Sodium 139 135 - 145 mmol/L   Potassium 3.8 3.5 - 5.1 mmol/L   Chloride 111 98 - 111 mmol/L   CO2 22 22 - 32 mmol/L   Glucose, Bld 110 (H) 70 - 99 mg/dL   BUN 19 8 - 23 mg/dL   Creatinine, Ser 1.23 (H) 0.44 - 1.00 mg/dL  Calcium 9.5 8.9 - 10.3 mg/dL   GFR  calc non Af Amer 40 (L) >60 mL/min   GFR calc Af Amer 47 (L) >60 mL/min   Anion gap 6 5 - 15    Comment: Performed at [facility], [address] [place], [place] [address]  Glucose, capillary     Status: Abnormal   Collection Time: 03/09/19  7:43 AM  Result Value Ref Range   Glucose-Capillary 114 (H) 70 - 99 mg/dL   Comment 1 Notify RN   Glucose, capillary     Status: Abnormal   Collection Time: 03/09/19 11:56 AM  Result Value Ref Range   Glucose-Capillary 132 (H) 70 - 99 mg/dL   Comment 1 Notify RN   Glucose, capillary     Status: None   Collection Time: 03/09/19  4:34 PM  Result Value Ref Range   Glucose-Capillary 96 70 - 99 mg/dL  Glucose, capillary     Status: Abnormal   Collection Time: 03/09/19  9:28 PM  Result Value Ref Range   Glucose-Capillary 117 (H) 70 - 99 mg/dL   Comment 1 Notify RN   Basic metabolic panel     Status: Abnormal   Collection Time: 03/10/19  3:35 AM  Result Value Ref Range   Sodium 139 135 - 145 mmol/L   Potassium 3.6 3.5 - 5.1 mmol/L   Chloride 108 98 - 111 mmol/L   CO2 23 22 - 32 mmol/L   Glucose, Bld 119 (H) 70 - 99 mg/dL   BUN 20 8 - 23 mg/dL   Creatinine, Ser 1.17 (H) 0.44 - 1.00 mg/dL   Calcium 9.2 8.9 - 10.3 mg/dL   GFR calc non Af Amer 43 (L) >60 mL/min   GFR calc Af Amer 50 (L) >60 mL/min   Anion gap 8 5 - 15    Comment: Performed at [facility], [address] [place], [place] [address]  Glucose, capillary     Status: Abnormal   Collection Time: 03/10/19  8:39 AM  Result Value Ref Range   Glucose-Capillary 128 (H) 70 - 99 mg/dL  Glucose, capillary     Status: Abnormal   Collection Time: 03/10/19 11:57 AM  Result Value Ref Range   Glucose-Capillary 107 (H) 70 - 99 mg/dL    Current Facility-Administered Medications  Medication Dose Route Frequency Provider Last Rate Last Dose  . 0.9 %  sodium chloride infusion   Intravenous Continuous Gouru, Aruna, MD 50 mL/hr at 03/10/19 0545    . calcium-vitamin D (OSCAL  WITH D) 500-200 MG-UNIT per tablet 1 tablet  1 tablet Oral BID Nicholes Mango, MD   1 tablet at 03/10/19 0908  . docusate sodium (COLACE) capsule 100 mg  100 mg Oral Daily PRN Gouru, Aruna, MD      . enalapril (VASOTEC) tablet 10 mg  10 mg Oral BID Gouru, Aruna, MD   10 mg at 03/10/19 0909  . enoxaparin (LOVENOX) injection 40 mg  40 mg Subcutaneous Q24H Gouru, Aruna, MD   40 mg at 03/10/19 1310  . hydrALAZINE (APRESOLINE) tablet 25 mg  25 mg Oral Q8H Epifanio Lesches, MD   25 mg at 03/10/19 1310  . hydrochlorothiazide (HYDRODIURIL) tablet 25 mg  25 mg Oral Daily Mayo, Pete Pelt, MD   25 mg at 03/10/19 0909  . insulin aspart (novoLOG) injection 0-5 Units  0-5 Units Subcutaneous QHS Gouru, Aruna, MD      . insulin aspart (novoLOG) injection 0-9 Units  0-9 Units  Subcutaneous TID WC Nicholes Mango, MD   1 Units at 03/10/19 0909  . loperamide (IMODIUM) capsule 4 mg  4 mg Oral PRN Seals, Theo Dills, NP      . metoprolol succinate (TOPROL-XL) 24 hr tablet 50 mg  50 mg Oral Daily Gouru, Aruna, MD   50 mg at 03/10/19 0908  . ondansetron (ZOFRAN) tablet 4 mg  4 mg Oral Q6H PRN Gouru, Aruna, MD       Or  . ondansetron (ZOFRAN) injection 4 mg  4 mg Intravenous Q6H PRN Gouru, Aruna, MD      . pantoprazole (PROTONIX) EC tablet 40 mg  40 mg Oral Daily Gouru, Aruna, MD   40 mg at 03/10/19 0908  . polyethylene glycol (MIRALAX / GLYCOLAX) packet 17 g  17 g Oral Daily Gouru, Aruna, MD   17 g at 03/10/19 0909  . vitamin B-12 (CYANOCOBALAMIN) tablet 1,000 mcg  1,000 mcg Oral Daily Gouru, Aruna, MD   1,000 mcg at 03/10/19 0908    Musculoskeletal: Strength & Muscle Tone: decreased Gait & Station: Patient remained in bed during evaluation Patient leans: N/A  Psychiatric Specialty Exam: Physical Exam  Nursing note and vitals reviewed. Constitutional: She is oriented to person, place, and time. She appears well-developed and well-nourished.  Cardiovascular: Normal rate.  Respiratory: Effort normal.   Musculoskeletal: Normal range of motion.  Neurological: She is alert and oriented to person, place, and time.    Review of Systems  Constitutional: Negative.   HENT: Negative.   Eyes: Negative.   Respiratory: Negative.   Cardiovascular: Negative.   Gastrointestinal: Negative.   Genitourinary: Negative.   Musculoskeletal: Negative.   Skin: Negative.   Neurological: Negative.   Endo/Heme/Allergies: Negative.   Psychiatric/Behavioral: Positive for depression, memory loss and suicidal ideas.    Blood pressure (!) 147/64, pulse 72, temperature 98.2 F (36.8 C), temperature source Oral, resp. rate 16, height 5\' 4"  (1.626 m), weight 75.8 kg, SpO2 98 %.Body mass index is 28.67 kg/m.  General Appearance: Casual  Eye Contact:  Good  Speech:  Clear and Coherent and Normal Rate  Volume:  Normal  Mood:  Depressed  Affect:  Depressed and Flat  Thought Process:  Coherent and Descriptions of Associations: Intact  Orientation:  Full (Time, Place, and Person)  Thought Content:  WDL  Suicidal Thoughts:  Yes, wishes to be dead, no plan in hospital  Homicidal Thoughts:  No  Memory:  Immediate;   Good Recent;   Fair Remote;   Poor  Judgement:  Fair  Insight:  Lacking  Psychomotor Activity:  Decreased  Concentration:  Concentration: Good  Recall:  [place] of Knowledge:  Fair  Language:  Good  Akathisia:  No  Handed:  Right  AIMS (if indicated):     Assets:  Communication Skills Desire for Improvement Financial Resources/Insurance Housing Resilience Social Support Transportation  ADL's:  Intact  Cognition:  Impaired,  Mild  Sleep:        Treatment Plan Summary: Daily contact with patient to assess and evaluate symptoms and progress in treatment and Medication management  Start Celexa 20 mg p.o. daily for depression and anxiety Start Seroquel 25 mg p.o. nightly to assist with depression and sleep At the discretion of the attending may use gabapentin 100 mg p.o. 3 times daily  or Librium low-dose every 6 hours as needed for a CIWA greater than 10 Continue IVC   Disposition: Recommend psychiatric Inpatient admission when medically cleared. Patient is scheduled to discharge  to geri-psychiatric facility tomorrow per chart review  Lewis Shock, FNP 03/10/2019 2:45 PM

## 2019-03-10 NOTE — TOC Progression Note (Addendum)
Transition of Care [facility]) - Progression Note    Patient Details  Name: ANALIS DISTLER MRN: [id number] Date of Birth: 1933/01/03  Transition of Care [facility]) CM/SW [place], RN Phone Number: 03/10/2019, 2:03 PM  Clinical Narrative:    I called [facility] ande made [facility] aware that the Patient can't be transported until morning, she stated that is fine, I faxed the IVC paper work to [phone] I alerted the physician and let her know that the patient can DC to the facility in the morning, I attempted to call [name] the daughter to notify her I got a VM and left a message for a call back    In the morning when ready to DC call the [place] sheriff's office and ask for LT Truman Hayward to arrange transport    Expected Discharge Plan and Services                                                 Social Determinants of Health (SDOH) Interventions    Readmission Risk Interventions No flowsheet data found.

## 2019-03-10 NOTE — Progress Notes (Signed)
Patient was restless, anxious and unable to sleep last night. She kept getting OOB, concerned about her telemetry box, and thought she was in a hotel. Contacted Dr. Gwendalyn Ege about her behavior. 0.5mg  xanax ordered and given. Patient still has not slept but is calm and laying in bed.

## 2019-03-11 LAB — GLUCOSE, CAPILLARY: Glucose-Capillary: 198 mg/dL — ABNORMAL HIGH (ref 70–99)

## 2019-03-11 MED ORDER — QUETIAPINE FUMARATE 25 MG PO TABS: 25.0000 mg | ORAL_TABLET | Freq: Every day | ORAL | 1 refills | Status: DC

## 2019-03-11 MED ORDER — METOPROLOL SUCCINATE ER 50 MG PO TB24: 50.0000 mg | ORAL_TABLET | Freq: Every day | ORAL | 0 refills | Status: AC

## 2019-03-11 MED ORDER — HYDRALAZINE HCL 25 MG PO TABS: 25.0000 mg | ORAL_TABLET | Freq: Three times a day (TID) | ORAL | 0 refills | Status: DC

## 2019-03-11 MED ORDER — LORAZEPAM 0.5 MG PO TABS: 0.5000 mg | ORAL_TABLET | Freq: Every day | ORAL | 0 refills | Status: DC

## 2019-03-11 MED ORDER — GABAPENTIN 100 MG PO CAPS: 100.0000 mg | ORAL_CAPSULE | Freq: Three times a day (TID) | ORAL | 0 refills | Status: DC

## 2019-03-11 NOTE — Care Management Important Message (Signed)
Important Message  Patient Details  Name: Paige Dougherty MRN: [id number] Date of Birth: 1932/08/14   Medicare Important Message Given:  Other (see comment)  Pt. discharged early as being transported to [facility]of[facility] facility before I could give Important Message from Medicare.   Juliann Pulse A Ladonna Vanorder 03/11/2019, 11:06 AM

## 2019-03-11 NOTE — Discharge Summary (Signed)
Paige Dougherty, is a 85 y.o. female  DOB 1933-01-17  MRN [id number].  Admission date:  03/06/2019  Admitting Physician  Christel Mormon, MD  Discharge Date:  03/11/2019   Primary MD  [place]  Recommendations for primary care physician for things to follow:   Follow-up with PCP in 1 week Follow-up with primary psychiatrist in 1 week   Admission Diagnosis  Intentional drug overdose, initial encounter [facility] [T50.902A]   Discharge Diagnosis  Intentional drug overdose, initial encounter ([place]) [T50.902A]   Principal Problem:   Major depressive disorder, recurrent severe without psychotic features ([place]) Active Problems:   Overdose, intentional self-harm, initial encounter [facility])      Past Medical History:  Diagnosis Date  . Allergy   . Anxiety   . Arthritis    HANDS/FEET  . Barrett's esophagus   . Breast cancer ([place]) 2016   RIGHT lumpectomy 2016 INVASIVE MAMMARY CARCINOMA   . Cancer of breast ([place]) 12/24/2014   radiation- Right  . Chronic headache   . Depression   . Diabetes mellitus   . Diverticulosis   . Esophageal stricture   . Fibrocystic breast disease   . GERD (gastroesophageal reflux disease)   . Hyperlipemia   . Hypertension   . IBS (irritable bowel syndrome)   . Personal history of radiation therapy 2016   RIGHT lumpectomy    INVASIVE MAMMARY CARCINOMA   . Urinary incontinence     Past Surgical History:  Procedure Laterality Date  . ABDOMINAL HYSTERECTOMY    . BREAST BIOPSY Bilateral    core bxs  . BREAST EXCISIONAL BIOPSY Right 2016   +  . BREAST LUMPECTOMY Right 2016   INVASIVE MAMMARY CARCINOMA   . CARPAL TUNNEL RELEASE     bilateral  . CATARACT EXTRACTION     bilateral   . COLONOSCOPY    . DILATION AND CURETTAGE OF UTERUS    . finger cyst removal    . neck cyst removal    .  UPPER GASTROINTESTINAL ENDOSCOPY         History of present illness and  Hospital Course:     Kindly see H&P for history of present illness and admission details, please review complete Labs, Consult reports and Test reports for all details in brief  HPI  from the history and physical done on the day of admission  85 year old female patient with history of major depression admitted for intentional drug overdose with suicidal ideation.  Hospital Course   #1 intentional drug overdose with Celexa, Ativan, melatonin, Remeron, admitted to medical unit, patient is made IVC, suicide watch placed, seen by psychiatrist, on admission patient psychiatric meds were altered, patient is followed by psychiatry, patient is worried about everything, husband's health, had previous suicidal attempt 10 years ago, patient sees psychiatrist at [facility] psychiatry.  Patient will go to geropsychiatry facility today.  Continue IVC as patient still endorses suicidal ideation.  Restarted on Celexa 20 mg daily, Seroquel 25 mg at night, psychiatry did not recommend starting back on benzodiazepines checked on standing dose, patient did not take benzodiazepines for 3 days, may have minor withdrawal symptoms can use gabapentin 100 mg p.o. 3 times daily for CIWA scale greater than 10.  Patient AST, ALT are within normal range. 2.  Moderate depression, anxiety, panic attacks, previously previous suicidal attempt, followed by psychiatry, patient is under involuntary commitment, patient will be on IVC, t discharge to geropsychiatry today.  Patient will be discharged to inpatient geropsychiatry  facility at [facility]. #3/acute kidney injury, due to dehydration, improved, received IV fluids. 4.  Diabetes mellitus type 2, patient was on metformin but did not receive metformin because of acute kidney injury in the hospital, as kidney injury improved patient can go back on metformin at  discharge. 5.  Essential hypertension, BP elevated during hospital stay secondary to her anxiety, patient on Toprol-XL at 50 mg daily, enalapril, hydrochlorothiazide.  Added hydralazine as well for BP control.  HCTZ was stopped during hospital stay secondary to acute kidney injury and dehydration. Discharge Condition: stable   Follow UP  Follow-up Information    [place] Schedule an appointment as soon as possible for a visit in 1 week(s).   Contact information: [place] [address] [phone]        Adin Hector, MD On 03/19/2019.   Specialty: Internal Medicine Why: AT 345 Contact information: [place] [address] [phone]             Discharge Instructions  and  Discharge Medications      Allergies as of 03/11/2019      Reactions   Avapro [irbesartan] Other (See Comments)   Other reaction(s): Unknown unknown   Azithromycin Other (See Comments)   Extreme vaginal burning. unknown   Buspirone Other (See Comments)   Burning sensations and felt overly hot    Clindamycin Other (See Comments)   Vaginal burning   Duloxetine Other (See Comments)   Hyperactivity.   Duloxetine Hcl Other (See Comments)   Hyperactivity.   Ezetimibe-simvastatin Other (See Comments)   Arthralgias.   Lipitor [atorvastatin] Other (See Comments)   "muscle aches"   Metronidazole Other (See Comments)   Penicillins    Prednisone Other (See Comments)   Dizziness/double vision   Procaine Other (See Comments)   tremors   Procaine Hcl    tremors   Eggs Or Egg-derived Products Other (See Comments)   Other Reaction: Not Assessed   Other Other (See Comments), Anxiety   Novacaine weakness   Statins Rash   Arthralgias.   Sulfa Antibiotics Rash   Vague history of a sulfa allergy, but does not remember the reaction.   Tetracycline Rash      Medication List    STOP taking these medications    HYDROcodone-acetaminophen 5-325 MG tablet Commonly known as: Norco   LORazepam 0.5 MG tablet Commonly known as: ATIVAN     TAKE these medications   calcium-vitamin D 500-200 MG-UNIT tablet Commonly known as: OSCAL WITH D Take 1 tablet by mouth 2 (two) times daily.   citalopram 20 MG tablet Commonly known as: CeleXA Take 2 tablets (40 mg total) by mouth daily. Start taking 1.5 tablets for 2 weeks and increase to 2 tablets.   enalapril 10 MG tablet Commonly known as: VASOTEC Take 10 mg by mouth 2 (two) times daily.   gabapentin 100 MG capsule Commonly known as: Neurontin Take 1 capsule (100 mg total) by mouth 3 (three) times daily for 4 days.   hydrALAZINE 25 MG tablet Commonly known as: APRESOLINE Take 1 tablet (25 mg total) by mouth every 8 (eight) hours.   hydrochlorothiazide 25 MG tablet Commonly known as: HYDRODIURIL Take 25 mg by mouth daily.   indomethacin 25 MG capsule Commonly known as: INDOCIN Take 25 mg by mouth 3 (three) times daily as needed.   metFORMIN 500 MG tablet Commonly known as: GLUCOPHAGE Take  500 mg by mouth daily with breakfast.   metoprolol succinate 50 MG 24 hr tablet Commonly known as: TOPROL-XL Take 1 tablet (50 mg total) by mouth daily. Take with or immediately following a meal. What changed:   how much to take  when to take this  additional instructions   omeprazole 20 MG capsule Commonly known as: PRILOSEC Take 1 capsule (20 mg total) by mouth daily.   QUEtiapine 25 MG tablet Commonly known as: SEROquel Take 1 tablet (25 mg total) by mouth at bedtime. What changed: how much to take   Red Yeast Rice 600 MG Caps Take by mouth.   vitamin B-12 1000 MCG tablet Commonly known as: CYANOCOBALAMIN Take 1 tablet (1,000 mcg total) by mouth daily.         Diet and Activity recommendation: See Discharge Instructions above   Consults obtained -psychiatry   Major procedures and Radiology Reports - PLEASE review detailed and  final reports for all details, in brief -      No results found.  Micro Results     Recent Results (from the past 240 hour(s))  SARS CORONAVIRUS 2 Nasal Swab Aptima Multi Swab     Status: None   Collection Time: 03/06/19  9:56 PM   Specimen: Aptima Multi Swab; Nasal Swab  Result Value Ref Range Status   SARS Coronavirus 2 NEGATIVE NEGATIVE Final    Comment: (NOTE) SARS-CoV-2 target nucleic acids are NOT DETECTED. The SARS-CoV-2 RNA is generally detectable in upper and lower respiratory specimens during the acute phase of infection. Negative results do not preclude SARS-CoV-2 infection, do not rule out co-infections with other pathogens, and should not be used as the sole basis for treatment or other patient management decisions. Negative results must be combined with clinical observations, patient history, and epidemiological information. The expected result is Negative. Fact Sheet for Patients: SugarRoll.be Fact Sheet for Healthcare Providers: https://www.woods-mathews.com/ This test is not yet approved or cleared by the [place] FDA and  has been authorized for detection and/or diagnosis of SARS-CoV-2 by FDA under an Emergency Use Authorization (EUA). This EUA will remain  in effect (meaning this test can be used) for the duration of the COVID-19 declaration under Section 56 4(b)(1) of the Act, 21 U.S.C. section 360bbb-3(b)(1), unless the authorization is terminated or revoked sooner. Performed at [facility], [place] [address] [place], [place] [address]        Today   Subjective:   Elanda Garmany today has no headache,no chest abdominal pain,no new weakness tingling or numbness, feels much better wants to go home today.  Objective:   Blood pressure (!) 152/80, pulse 78, temperature 97.8 F (36.6 C), temperature source Oral, resp. rate 18, height 5\' 4"  (1.626 m), weight 75.8 kg, SpO2 98 %.   Intake/Output  Summary (Last 24 hours) at 03/11/2019 0910 Last data filed at 03/10/2019 2300 Gross per 24 hour  Intake 1624.45 ml  Output -  Net 1624.45 ml    Exam Awake Alert, Oriented x 3, No new F.N deficits, Normal affect [place].AT,PERRAL Supple Neck,No JVD, No cervical lymphadenopathy appriciated.  Symmetrical Chest wall movement, Good air movement bilaterally, CTAB RRR,No Gallops,Rubs or new Murmurs, No Parasternal Heave +ve B.Sounds, Abd Soft, Non tender, No organomegaly appriciated, No rebound -guarding or rigidity. No Cyanosis, Clubbing or edema, No new Rash or bruise  Data Review   CBC w Diff:  Lab Results  Component Value Date   WBC 5.3 03/08/2019   HGB 11.5 (L)  03/08/2019   HGB 13.3 12/02/2014   HCT 34.7 (L) 03/08/2019   HCT 41.3 12/02/2014   PLT 204 03/08/2019   PLT 220 12/02/2014   LYMPHOPCT 25 03/06/2019   LYMPHOPCT 15.6 12/02/2014   MONOPCT 10 03/06/2019   MONOPCT 7.6 12/02/2014   EOSPCT 2 03/06/2019   EOSPCT 1.5 12/02/2014   BASOPCT 1 03/06/2019   BASOPCT 0.5 12/02/2014    CMP:  Lab Results  Component Value Date   NA 139 03/10/2019   K 3.6 03/10/2019   CL 108 03/10/2019   CO2 23 03/10/2019   BUN 20 03/10/2019   CREATININE 1.17 (H) 03/10/2019   PROT 5.9 (L) 03/06/2019   ALBUMIN 3.3 (L) 03/06/2019   BILITOT 0.7 03/06/2019   ALKPHOS 67 03/06/2019   AST 19 03/06/2019   ALT 18 03/06/2019  .   Total Time in preparing paper work, data evaluation and todays exam - 50 minutes  Epifanio Lesches M.D on 03/11/2019 at 9:10 AM    Note: This dictation was prepared with Dragon dictation along with smaller phrase technology. Any transcriptional errors that result from this process are unintentional.

## 2019-03-11 NOTE — Progress Notes (Signed)
Patient in stable condition. PIV removed.Printed AVS and Rx in discharge packet. All belongings packed. Called report to Statistician at [facility]

## 2019-03-11 NOTE — TOC Transition Note (Signed)
Transition of Care [facility]) - CM/SW Discharge Note   Patient Details  Name: KARLIN BINION MRN: [id number] Date of Birth: Mar 23, 1933  [facility] CM/SW Contact:  Su Hilt, RN Phone Number: 03/11/2019, 9:32 AM   Clinical Narrative:     Patient to transfer to [facility] in [place] today, transportation provided by the [employer] office the patient remains IVC The nurse to call report when ready. Room 637, number to call [phone],  Officer will come to the hospital around 10 am to transport.   I called the daughter [name] to notify of the transfer at [phone],   I didn't get an answer but left a voice mail The Discharge paperwork is on the chart including the IVC papers and the [facility] papers  Final next level of care: Psychiatric Hospital Barriers to Discharge: Barriers Resolved   Patient Goals and CMS Choice        Discharge Placement                       Discharge Plan and Services                                     Social Determinants of Health (SDOH) Interventions     Readmission Risk Interventions No flowsheet data found.

## 2019-03-22 ENCOUNTER — Telehealth: Payer: Self-pay | Admitting: Medical Oncology

## 2019-03-22 NOTE — Telephone Encounter (Signed)
Pts daughter was contacted to let her know that pts medications are in storage in pharmacy.

## 2019-03-23 ENCOUNTER — Encounter: Payer: Self-pay | Admitting: Psychiatry

## 2019-03-23 ENCOUNTER — Ambulatory Visit (INDEPENDENT_AMBULATORY_CARE_PROVIDER_SITE_OTHER): Payer: Medicare HMO | Admitting: Psychiatry

## 2019-03-23 ENCOUNTER — Other Ambulatory Visit: Payer: Self-pay

## 2019-03-23 DIAGNOSIS — F3181 Bipolar II disorder: Secondary | ICD-10-CM | POA: Insufficient documentation

## 2019-03-23 DIAGNOSIS — F410 Panic disorder [episodic paroxysmal anxiety] without agoraphobia: Secondary | ICD-10-CM

## 2019-03-23 DIAGNOSIS — F3175 Bipolar disorder, in partial remission, most recent episode depressed: Secondary | ICD-10-CM | POA: Insufficient documentation

## 2019-03-23 DIAGNOSIS — F411 Generalized anxiety disorder: Secondary | ICD-10-CM | POA: Diagnosis not present

## 2019-03-23 MED ORDER — OLANZAPINE 5 MG PO TBDP: 2.5000 mg | ORAL_TABLET | Freq: Two times a day (BID) | ORAL | 0 refills | Status: DC

## 2019-03-23 MED ORDER — LORAZEPAM 0.5 MG PO TABS: 0.5000 mg | ORAL_TABLET | Freq: Every day | ORAL | 0 refills | Status: DC

## 2019-03-23 NOTE — Progress Notes (Signed)
Virtual Visit via Video Note  I connected with Paige Dougherty on 03/23/19 at  4:30 PM EDT by a video enabled telemedicine application and verified that I am speaking with the correct person using two identifiers.   I discussed the limitations of evaluation and management by telemedicine and the availability of in person appointments. The patient expressed understanding and agreed to proceed.   I discussed the assessment and treatment plan with the patient. The patient was provided an opportunity to ask questions and all were answered. The patient agreed with the plan and demonstrated an understanding of the instructions.   The patient was advised to call back or seek an in-person evaluation if the symptoms worsen or if the condition fails to improve as anticipated.    [place] MD OP Progress Note  03/23/2019 5:29 PM Paige Dougherty  MRN:  [id number]  Chief Complaint:  Chief Complaint    Follow-up     HPI: Paige Dougherty is an 85 year old Caucasian female, married, lives with her husband in [place], has a history of major depressive disorder, generalized anxiety disorder, panic attacks, insomnia, hypertension, IBS, diabetes melitis, history of breast cancer was evaluated by telemedicine today.  A video call was initiated however it had to be changed to a phone call due to connection problem.  Patient was recently admitted to [facility] after a suicide attempt.  Patient had overdosed on her prescription medications Dougherty lorazepam.  Patient was admitted to the medical floor at [facility] and thereafter was sent to psychiatric unit in [place] ,[place].  Patient was managed on medications at the facility.  Patient was given a diagnosis of bipolar disorder type II, panic disorder, generalized anxiety disorder and was discharged on medications Dougherty olanzapine and lorazepam.  I have reviewed medical records from [facility] for this admission- per records  patient on August 1st described complete hopelessness and somewhat impulsive decision to take all her pills.  She however currently describes that she does not want to die but wants help so she can get back to enjoying her life.  I do not think I will ever do that again I hurt my husband and my daughter too much.  Patient had similar overdose on medication 4 years ago.  She had another hospitalization at that visit.  Daughter shares that her mother had an experience in the summer before her senior year of high school in which her parents moved her to [place] where she had to go to a much larger high school.  This caused overwhelming anxiety to the point of threatening suicide  before she would attend the school and the patient describes this in her interview as well.'  Patient today continues to report some depression as well as anxiety symptoms.  She however reported that she is making some progress.  She denies any side effects to the olanzapine except for some sleepiness during the day which seem to be getting better.  She continues to report some on and off passive suicidal ideation, currently denies any active thoughts or plan.  She reports she however does think about' what if this is all over', kind of feeling.  Patient agrees to talk to her daughter or her husband if she has any active thoughts.  Crisis plan discussed with patient as well as daughter [name].  Patient was referred for CBT however per [facility] she did not get a call from the therapist.  Will communicate with therapist again.  Visit Diagnosis:    ICD-10-CM   1. Bipolar 2 disorder, major depressive episode ([place])  F31.81   2. GAD (generalized anxiety disorder)  F41.1   3. Panic attacks  F41.0     Past Psychiatric History: I have reviewed past psychiatric history from my progress note on 01/06/2019.  Past trials of Lexapro, Celexa, mirtazapine, lorazepam, BuSpar, Seroquel.  Patient had recent admission at [facility] after a suicide attempt.  Past Medical History:  Past Medical History:  Diagnosis Date  . Allergy   . Anxiety   . Arthritis    HANDS/FEET  . Barrett's esophagus   . Breast cancer ([place]) 2016   RIGHT lumpectomy 2016 INVASIVE MAMMARY CARCINOMA   . Cancer of breast ([place]) 12/24/2014   radiation- Right  . Chronic headache   . Depression   . Diabetes mellitus   . Diverticulosis   . Esophageal stricture   . Fibrocystic breast disease   . GERD (gastroesophageal reflux disease)   . Hyperlipemia   . Hypertension   . IBS (irritable bowel syndrome)   . Personal history of radiation therapy 2016   RIGHT lumpectomy    INVASIVE MAMMARY CARCINOMA   . Urinary incontinence     Past Surgical History:  Procedure Laterality Date  . ABDOMINAL HYSTERECTOMY    . BREAST BIOPSY Bilateral    core bxs  . BREAST EXCISIONAL BIOPSY Right 2016   +  . BREAST LUMPECTOMY Right 2016   INVASIVE MAMMARY CARCINOMA   . CARPAL TUNNEL RELEASE     bilateral  . CATARACT EXTRACTION     bilateral   . COLONOSCOPY    . DILATION AND CURETTAGE OF UTERUS    . finger cyst removal    . neck cyst removal    . UPPER GASTROINTESTINAL ENDOSCOPY      Family Psychiatric History: I have reviewed family psychiatric history from my progress note on 01/06/2019.  Family History:  Family History  Problem Relation Age of Onset  . Colon cancer Father   . Thyroid disease Father   . Thyroid disease Mother   . Hypertension Mother   . Alzheimer's disease Mother   . Colon cancer Maternal Grandmother   . Breast cancer Maternal Grandmother   . Anxiety disorder Daughter     Social History: I have reviewed social history from my progress note on 01/06/2019. Social History   Socioeconomic History  . Marital status: Married    Spouse name: Not on file  . Number of children: Not on file  . Years of education: Not on file  . Highest education level: Not on file  Occupational History  . Not on file  Social Needs   . Financial resource strain: Not on file  . Food insecurity    Worry: Not on file    Inability: Not on file  . Transportation needs    Medical: Not on file    Non-medical: Not on file  Tobacco Use  . Smoking status: Never Smoker  . Smokeless tobacco: Never Used  Substance and Sexual Activity  . Alcohol use: Yes    Comment: occasional  . Drug use: No  . Sexual activity: Not on file  Lifestyle  . Physical activity    Days per week: Not on file    Minutes per session: Not on file  . Stress: Not on file  Relationships  . Social Herbalist on phone: Not on file    Gets together:  Not on file    Attends religious service: Not on file    Active member of club or organization: Not on file    Attends meetings of clubs or organizations: Not on file    Relationship status: Not on file  Other Topics Concern  . Not on file  Social History Narrative  . Not on file    Allergies:  Allergies  Allergen Reactions  . Avapro [Irbesartan] Other (See Comments)    Other reaction(s): Unknown unknown  . Azithromycin Other (See Comments)    Extreme vaginal burning. unknown  . Buspirone Other (See Comments)    Burning sensations and felt overly hot   . Clindamycin Other (See Comments)    Vaginal burning  . Duloxetine Other (See Comments)    Hyperactivity.  . Duloxetine Hcl Other (See Comments)    Hyperactivity.  . Ezetimibe-Simvastatin Other (See Comments)    Arthralgias.  . Lipitor [Atorvastatin] Other (See Comments)    "muscle aches"  . Metronidazole Other (See Comments)  . Penicillins   . Prednisone Other (See Comments)    Dizziness/double vision  . Procaine Other (See Comments)    tremors  . Procaine Hcl     tremors  . Eggs Or Egg-Derived Products Other (See Comments)    Other Reaction: Not Assessed  . Other Other (See Comments) and Anxiety    Novacaine weakness  . Statins Rash    Arthralgias.  . Sulfa Antibiotics Rash    Vague history of a sulfa allergy, but  does not remember the reaction.  . Tetracycline Rash    Metabolic Disorder Labs: Lab Results  Component Value Date   HGBA1C 6.0 (H) 03/07/2019   MPG 125.5 03/07/2019   No results found for: PROLACTIN No results found for: CHOL, TRIG, HDL, CHOLHDL, VLDL, LDLCALC Lab Results  Component Value Date   TSH 2.517 03/07/2019    Therapeutic Level Labs: No results found for: LITHIUM No results found for: VALPROATE No components found for:  CBMZ  Current Medications: Current Outpatient Medications  Medication Sig Dispense Refill  . calcium-vitamin D (OSCAL WITH D) 500-200 MG-UNIT per tablet Take 1 tablet by mouth 2 (two) times daily. 60 tablet 6  . enalapril (VASOTEC) 10 MG tablet Take 10 mg by mouth 2 (two) times daily.    Marland Kitchen gabapentin (NEURONTIN) 100 MG capsule Take 1 capsule (100 mg total) by mouth 3 (three) times daily for 4 days. 12 capsule 0  . hydrALAZINE (APRESOLINE) 25 MG tablet Take 1 tablet (25 mg total) by mouth every 8 (eight) hours. 60 tablet 0  . hydrochlorothiazide (HYDRODIURIL) 25 MG tablet Take 25 mg by mouth daily.    . indomethacin (INDOCIN) 25 MG capsule Take 25 mg by mouth 3 (three) times daily as needed.    Marland Kitchen LORazepam (ATIVAN) 0.5 MG tablet Take 1 tablet (0.5 mg total) by mouth at bedtime. 30 tablet 0  . metFORMIN (GLUCOPHAGE) 500 MG tablet Take 500 mg by mouth daily with breakfast.    . metoprolol succinate (TOPROL-XL) 50 MG 24 hr tablet Take 1 tablet (50 mg total) by mouth daily. Take with or immediately following a meal. 30 tablet 0  . OLANZapine zydis (ZYPREXA) 5 MG disintegrating tablet Take 0.5 tablets (2.5 mg total) by mouth 2 (two) times daily. 30 tablet 0  . omeprazole (PRILOSEC) 20 MG capsule Take 1 capsule (20 mg total) by mouth daily.    . Red Yeast Rice 600 MG CAPS Take by mouth.    Marland Kitchen  vitamin B-12 (CYANOCOBALAMIN) 1000 MCG tablet Take 1 tablet (1,000 mcg total) by mouth daily.     No current facility-administered medications for this visit.       Musculoskeletal: Strength & Muscle Tone: UTA Gait & Station: Reports as WNL Patient leans: N/A  Psychiatric Specialty Exam: Review of Systems  Psychiatric/Behavioral: Positive for depression. The patient is nervous/anxious.   All other systems reviewed and are negative.   There were no vitals taken for this visit.There is no height or weight on file to calculate BMI.  General Appearance: UTA  Eye Contact:  UTA  Speech:  Clear and Coherent  Volume:  Normal  Mood:  Anxious and Depressed  Affect:  UTA  Thought Process:  Goal Directed and Descriptions of Associations: Intact  Orientation:  Full (Time, Place, and Person)  Thought Content: Logical   Suicidal Thoughts:  No continues to report on and off passive thoughts however currently denies it.  Homicidal Thoughts:  No  Memory:  Immediate;   Fair Recent;   Good Remote;   Fair  Judgement:  Fair  Insight:  Fair  Psychomotor Activity:  UTA  Concentration:  Concentration: Fair and Attention Span: Fair  Recall:  [employer] of Knowledge: Fair  Language: Fair  Akathisia:  No  Handed:  Right  AIMS (if indicated): UTA  Assets:  Communication Skills Desire for Improvement Housing Social Support  ADL's:  Intact  Cognition: WNL  Sleep:  Improving   Screenings: PHQ2-9     Follow Up  from 12/03/2017 in [facility] Follow Up  from 07/01/2016 in [facility] Follow Up  from 07/03/2015 in [facility]  PHQ-2 Total Score  0  0  0       Assessment and Plan:Chrystian is an 85 year old Caucasian female, married, lives in [place], has a history of MDD, GAD, panic attacks, hypertension, IBS, diabetes, history of breast cancer was evaluated by telemedicine today.  Patient is biologically predisposed given her family history of mental health problems as well as her own health issues.  She also has psychosocial stressors of  husband's health problems, current pandemic.  Patient recently had a suicide attempt and was admitted to psychiatric unit as summarized above.  She continues to struggle with chronic suicidal thoughts however currently denies any active thoughts or plan.  However given her race, her history of chronic mental health problems, her recent attempt and history of suicide attempt, her acute risk for suicide even though low, chronic risk continues to be moderate to high.  Crisis plan discussed with patient as well as daughter [name].  Patient to be monitored closely.  Daughter agrees to do so.  Plan Bipolar disorder type II- improving Olanzapine 2.5 mg p.o. twice daily   For panic disorder- some progress Lorazepam 0.5 mg p.o. nightly Refer for CBT-I have communicated with Ms. Peacock who reported that patient has been referred to another program.  However per daughter they have not received any call from them.    I have reviewed medical records from [facility]-dated March 13, 2019 as summarized above.  Follow-up in clinic in 2 weeks or sooner if needed.  I have spent atleast 25 minutes non face to face with patient today. More than 50 % of the time was spent for psychoeducation and supportive psychotherapy and care coordination.  This note was generated in part or whole with voice recognition software. Voice recognition is  usually quite accurate but there are transcription errors that can and very often do occur. I apologize for any typographical errors that were not detected and corrected.        Ursula Alert, MD 03/23/2019, 5:29 PM

## 2019-03-24 ENCOUNTER — Telehealth: Payer: Self-pay

## 2019-03-24 NOTE — Telephone Encounter (Signed)
faxed and confirmed referral for pt. - referral pending.

## 2019-03-24 NOTE — Telephone Encounter (Signed)
please refer for neurology evaluation for possible neurocognitive disorder - please ask them to call daughter Paige Dougherty - when they call for appt. her number is in chart.thanks

## 2019-03-25 ENCOUNTER — Telehealth (HOSPITAL_COMMUNITY): Payer: Self-pay | Admitting: Psychiatry

## 2019-03-25 NOTE — Telephone Encounter (Signed)
D:  Royal Piedra, LCSW referred pt to MH-IOP.  A:  Placed call to # provided to orient and answer questions.  Pt's daughter declined group at this time.  "It would be too much for mom; she needs one on one."  According to daughter, pt has upcoming phone appointment with [facility] case manager next week.  Inform Royal Piedra, LCSW.

## 2019-03-29 DIAGNOSIS — N183 Chronic kidney disease, stage 3 unspecified: Secondary | ICD-10-CM | POA: Insufficient documentation

## 2019-03-31 ENCOUNTER — Telehealth: Payer: Self-pay

## 2019-03-31 DIAGNOSIS — F3181 Bipolar II disorder: Secondary | ICD-10-CM

## 2019-03-31 DIAGNOSIS — F410 Panic disorder [episodic paroxysmal anxiety] without agoraphobia: Secondary | ICD-10-CM

## 2019-03-31 DIAGNOSIS — F411 Generalized anxiety disorder: Secondary | ICD-10-CM

## 2019-03-31 MED ORDER — CITALOPRAM HYDROBROMIDE 10 MG PO TABS: 10.0000 mg | ORAL_TABLET | Freq: Every day | ORAL | 1 refills | Status: DC

## 2019-03-31 NOTE — Telephone Encounter (Signed)
Spoke to [employer]. Per daughter patient appears to be lethargic, zoned out.  She feels she is depressed.  Panic attacks have improved.  She is sleeping okay.  She appears to be alert and oriented.  Discussed adding Celexa back at the lower dose of 10 mg.  She will monitor her mom closely and if she decompensate will take her to the nearest emergency department.  She is also worried about the therapy sessions.  She was referred for IOP she however does not think mom can do IOP.  Discussed with her to reach out to Ms. Peacock again.  If she continues to have trouble , provided her therapist phone number in the community.

## 2019-03-31 NOTE — Telephone Encounter (Signed)
pt daughter called states that she is having a medication issue with her mother meds.  states that her mother has no will to get up. and that she does want to do anything.  she also states that nicole has not gotten back with her about a therapist. and she really needs one as soon as possible. She ask that you call her at [phone]

## 2019-04-01 ENCOUNTER — Telehealth: Payer: Self-pay

## 2019-04-01 NOTE — Telephone Encounter (Signed)
she states she would not be able to take this patient.

## 2019-04-01 NOTE — Telephone Encounter (Signed)
spoke with patient daughter explained that the 1st referral did not work out but i did find a Transport planner that accepted pt insurance. she was told that demographic sheet and office notes had been faxed and confirmed to new therapist Margeret Tollie Pizza.  The phone number and address was provided to patient daughter. I told patient daughter to call if she does not hear from therapist by Friday of next week and I would call and find out statues. I also told her that I was mailing out a release of info for pt to sign to give [place] permission to be in contact with the therapist and she agreed on getting info signed and mailed back.

## 2019-04-01 NOTE — Telephone Encounter (Signed)
contacted Paige Tollie Pizza, LCSW she states she does accept pt insurance and info was given about dx: she states she would take patient.  i faxed over the demo sheet and last office note to Paige Paschal, LCSW at fax  # [phone].

## 2019-04-01 NOTE — Telephone Encounter (Signed)
Thanks a lot [employer]

## 2019-04-06 ENCOUNTER — Ambulatory Visit: Payer: Medicare HMO | Admitting: Psychiatry

## 2019-04-07 ENCOUNTER — Encounter: Payer: Self-pay | Admitting: Psychiatry

## 2019-04-07 ENCOUNTER — Ambulatory Visit (INDEPENDENT_AMBULATORY_CARE_PROVIDER_SITE_OTHER): Payer: Medicare HMO | Admitting: Psychiatry

## 2019-04-07 ENCOUNTER — Other Ambulatory Visit: Payer: Self-pay

## 2019-04-07 DIAGNOSIS — F411 Generalized anxiety disorder: Secondary | ICD-10-CM | POA: Diagnosis not present

## 2019-04-07 DIAGNOSIS — F410 Panic disorder [episodic paroxysmal anxiety] without agoraphobia: Secondary | ICD-10-CM

## 2019-04-07 DIAGNOSIS — F3181 Bipolar II disorder: Secondary | ICD-10-CM | POA: Diagnosis not present

## 2019-04-07 MED ORDER — LORAZEPAM 0.5 MG PO TABS: 0.5000 mg | ORAL_TABLET | Freq: Every day | ORAL | 2 refills | Status: DC

## 2019-04-07 MED ORDER — OLANZAPINE 5 MG PO TBDP: 2.5000 mg | ORAL_TABLET | Freq: Two times a day (BID) | ORAL | 0 refills | Status: DC

## 2019-04-07 NOTE — Progress Notes (Signed)
Virtual Visit via Video Note  I connected with Paige Dougherty on 04/07/19 at 11:45 AM EDT by a video enabled telemedicine application and verified that I am speaking with the correct person using two identifiers.   I discussed the limitations of evaluation and management by telemedicine and the availability of in person appointments. The patient expressed understanding and agreed to proceed.   I discussed the assessment and treatment plan with the patient. The patient was provided an opportunity to ask questions and all were answered. The patient agreed with the plan and demonstrated an understanding of the instructions.   The patient was advised to call back or seek an in-person evaluation if the symptoms worsen or if the condition fails to improve as anticipated.  [facility] MD OP Progress Note  04/07/2019 12:14 PM Paige Dougherty  MRN:  [id number]  Chief Complaint:  Chief Complaint    Follow-up     HPI: Paige Dougherty is a 85 year old Caucasian female, married, lives with her husband in [place], has a history of MDD, GAD, panic attacks, insomnia, hypertension, IBS, diabetes melitis, history of breast cancer was evaluated by telemedicine today.  Jeriesha today had problems with video connection and it had to changed to phone call.  Patient today reports that she is feeling better than before.  She however reports she continues to have some repetitive thoughts in her head- words that does not make sense.  She gives an example- words Dougherty' Tra la la " , but she keeps repeating herself in her mind.  She denies auditory or visual hallucinations.  She denies any other perceptual disturbances.  She continues to be anxious and does feel depressed on and off.  However reports it is better than before.  She reports sleep as better than before.  She sleeps at least 7 hours a night.  She continues to have low appetite.  She has not been eating much.  She also feels shaky often.  She did have this since a long time  however is getting better.  Patient denies any current suicidal thoughts or plan.  She however reports that she has passive suicidal thoughts which are chronic for her.  She however denies any plan.  She agrees to ask for help when she needs it.  She reports she will not act on it.  Per daughter  Paige Dougherty-' she is doing better on the medication dosages.  She takes her Zyprexa at bedtime now and that helps.  She does another dose of Zyprexa in the morning with her Celexa.  There are times when she appears to be confused.  It is mostly for the day and the date and certain things.  But overall it looks Dougherty she is making progress.  She does have an upcoming appointment with neurology however that is in October.  She also has upcoming appointment with therapist to whom she was referred to.  They are trying to figure out her health insurance plan and everything before they go for the appointment.  Paige Dougherty reports patient does have trouble with her appetite.  Discussed meal planning, taking snacks more often and setting up time for her meals and snack throughout the day.  Also discussed orienting her to her surroundings.'     Visit Diagnosis: R/O Neurocognitive disorder   ICD-10-CM   1. Bipolar 2 disorder, major depressive episode (HCC)  F31.81 LORazepam (ATIVAN) 0.5 MG tablet    OLANZapine zydis (ZYPREXA) 5 MG disintegrating tablet  2. GAD (generalized anxiety disorder)  F41.1 LORazepam (ATIVAN) 0.5 MG tablet    OLANZapine zydis (ZYPREXA) 5 MG disintegrating tablet  3. Panic attacks  F41.0 LORazepam (ATIVAN) 0.5 MG tablet    OLANZapine zydis (ZYPREXA) 5 MG disintegrating tablet    Past Psychiatric History: I have reviewed past psychiatric history from my progress note on 01/06/2019.  Past trials of Lexapro, Celexa, mirtazapine, lorazepam, BuSpar, Seroquel.  Patient with recent inpatient admission at [facility] after a suicide attempt.  Past Medical History:  Past Medical  History:  Diagnosis Date  . Allergy   . Anxiety   . Arthritis    HANDS/FEET  . Barrett's esophagus   . Breast cancer ([place]) 2016   RIGHT lumpectomy 2016 INVASIVE MAMMARY CARCINOMA   . Cancer of breast ([place]) 12/24/2014   radiation- Right  . Chronic headache   . Depression   . Diabetes mellitus   . Diverticulosis   . Esophageal stricture   . Fibrocystic breast disease   . GERD (gastroesophageal reflux disease)   . Hyperlipemia   . Hypertension   . IBS (irritable bowel syndrome)   . Personal history of radiation therapy 2016   RIGHT lumpectomy    INVASIVE MAMMARY CARCINOMA   . Urinary incontinence     Past Surgical History:  Procedure Laterality Date  . ABDOMINAL HYSTERECTOMY    . BREAST BIOPSY Bilateral    core bxs  . BREAST EXCISIONAL BIOPSY Right 2016   +  . BREAST LUMPECTOMY Right 2016   INVASIVE MAMMARY CARCINOMA   . CARPAL TUNNEL RELEASE     bilateral  . CATARACT EXTRACTION     bilateral   . COLONOSCOPY    . DILATION AND CURETTAGE OF UTERUS    . finger cyst removal    . neck cyst removal    . UPPER GASTROINTESTINAL ENDOSCOPY      Family Psychiatric History: I have reviewed family psychiatric history from my progress note on 01/06/2019. Family History:  Family History  Problem Relation Age of Onset  . Colon cancer Father   . Thyroid disease Father   . Thyroid disease Mother   . Hypertension Mother   . Alzheimer's disease Mother   . Colon cancer Maternal Grandmother   . Breast cancer Maternal Grandmother   . Anxiety disorder Daughter     Social History: I have reviewed social history from my progress note on 01/06/2019. Social History   Socioeconomic History  . Marital status: Married    Spouse name: Not on file  . Number of children: Not on file  . Years of education: Not on file  . Highest education level: Not on file  Occupational History  . Not on file  Social Needs  . Financial resource strain: Not on file  . Food insecurity    Worry: Not on  file    Inability: Not on file  . Transportation needs    Medical: Not on file    Non-medical: Not on file  Tobacco Use  . Smoking status: Never Smoker  . Smokeless tobacco: Never Used  Substance and Sexual Activity  . Alcohol use: Yes    Comment: occasional  . Drug use: No  . Sexual activity: Not on file  Lifestyle  . Physical activity    Days per week: Not on file    Minutes per session: Not on file  . Stress: Not on file  Relationships  . Social connections    Talks on phone: Not on file  Gets together: Not on file    Attends religious service: Not on file    Active member of club or organization: Not on file    Attends meetings of clubs or organizations: Not on file    Relationship status: Not on file  Other Topics Concern  . Not on file  Social History Narrative  . Not on file    Allergies:  Allergies  Allergen Reactions  . Avapro [Irbesartan] Other (See Comments)    Other reaction(s): Unknown unknown  . Azithromycin Other (See Comments)    Extreme vaginal burning. unknown  . Buspirone Other (See Comments)    Burning sensations and felt overly hot   . Clindamycin Other (See Comments)    Vaginal burning  . Duloxetine Other (See Comments)    Hyperactivity.  . Duloxetine Hcl Other (See Comments)    Hyperactivity.  . Ezetimibe-Simvastatin Other (See Comments)    Arthralgias.  . Lipitor [Atorvastatin] Other (See Comments)    "muscle aches"  . Metronidazole Other (See Comments)  . Penicillins   . Prednisone Other (See Comments)    Dizziness/double vision  . Procaine Other (See Comments)    tremors  . Procaine Hcl     tremors  . Eggs Or Egg-Derived Products Other (See Comments)    Other Reaction: Not Assessed  . Other Other (See Comments) and Anxiety    Novacaine weakness  . Statins Rash    Arthralgias.  . Sulfa Antibiotics Rash    Vague history of a sulfa allergy, but does not remember the reaction.  . Tetracycline Rash    Metabolic Disorder  Labs: Lab Results  Component Value Date   HGBA1C 6.0 (H) 03/07/2019   MPG 125.5 03/07/2019   No results found for: PROLACTIN No results found for: CHOL, TRIG, HDL, CHOLHDL, VLDL, LDLCALC Lab Results  Component Value Date   TSH 2.517 03/07/2019    Therapeutic Level Labs: No results found for: LITHIUM No results found for: VALPROATE No components found for:  CBMZ  Current Medications: Current Outpatient Medications  Medication Sig Dispense Refill  . calcium-vitamin D (OSCAL WITH D) 500-200 MG-UNIT per tablet Take 1 tablet by mouth 2 (two) times daily. 60 tablet 6  . citalopram (CELEXA) 10 MG tablet Take 1 tablet (10 mg total) by mouth daily. 30 tablet 1  . enalapril (VASOTEC) 10 MG tablet Take 10 mg by mouth 2 (two) times daily.    . indomethacin (INDOCIN) 25 MG capsule Take 25 mg by mouth 3 (three) times daily as needed.    Derrill Memo ON 04/19/2019] LORazepam (ATIVAN) 0.5 MG tablet Take 1 tablet (0.5 mg total) by mouth at bedtime. 30 tablet 2  . metoprolol succinate (TOPROL-XL) 50 MG 24 hr tablet Take 1 tablet (50 mg total) by mouth daily. Take with or immediately following a meal. 30 tablet 0  . OLANZapine zydis (ZYPREXA) 5 MG disintegrating tablet Take 0.5 tablets (2.5 mg total) by mouth 2 (two) times daily. 90 tablet 0  . omeprazole (PRILOSEC) 20 MG capsule Take 1 capsule (20 mg total) by mouth daily.    . Red Yeast Rice 600 MG CAPS Take by mouth.    . vitamin B-12 (CYANOCOBALAMIN) 1000 MCG tablet Take 1 tablet (1,000 mcg total) by mouth daily.     No current facility-administered medications for this visit.      Musculoskeletal: Strength & Muscle Tone: UTA Gait & Station: Reports as WNL Patient leans: N/A  Psychiatric Specialty Exam: Review of Systems  Neurological:  Positive for tremors.  Psychiatric/Behavioral: Positive for depression. The patient is nervous/anxious.   All other systems reviewed and are negative.   There were no vitals taken for this visit.There is no  height or weight on file to calculate BMI.  General Appearance: Casual  Eye Contact:  Fair  Speech:  Clear and Coherent  Volume:  Normal  Mood:  Anxious and Depressed  Affect:  UTA  Thought Process:  Goal Directed and Descriptions of Associations: Intact  Orientation:  Full (Time, Place, and Person)  Thought Content: Logical   Suicidal Thoughts:  No  Homicidal Thoughts:  No  Memory:  Immediate;   Fair Recent;   Fair Remote;   Fair  Judgement:  Fair  Insight:  Fair  Psychomotor Activity:  UTA  Concentration:  Concentration: Fair and Attention Span: Fair  Recall:  [employer] of Knowledge: Fair  Language: Fair  Akathisia:  No  Handed:  Right  AIMS (if indicated): does have tremors on and off   Assets:  Communication Skills Desire for Improvement Housing Social Support  ADL's:  Intact  Cognition: WNL  Sleep:  Fair   Screenings: PHQ2-9     Follow Up  from 12/03/2017 in [facility] Follow Up  from 07/01/2016 in [facility] Follow Up  from 07/03/2015 in [facility]  PHQ-2 Total Score  0  0  0       Assessment and Plan: Ronna is an 85 year old Caucasian female, married, lives in [place], has a history of MDD, GAD, panic attacks, hypertension, IBS, diabetes, history of breast cancer was evaluated by telemedicine today.  Patient is biologically predisposed given her family history of mental health problems as well as her own health issues.  She also has psychosocial stressors of husband's health problems, current pandemic.  Patient is currently making progress however continues to struggle with some mood symptoms and cognitive issues.  Discussed plan as noted below.  Plan Bipolar disorder type II-improving Olanzapine 2.5 mg p.o. twice daily.  For panic disorder-improving Lorazepam 0.5 mg p.o. nightly Patient referred for CBT- Chuck Hint. Celexa 10 mg p.o.  daily.  Discussed with patient to take the Celexa in the evening to see if that will help with some of the side effects of fatigue tiredness.  GAD-improving Continue CBT. Celexa 10 mg p.o. daily  Collateral information was obtained from daughter-Monica as summarized above.  Rule out neurocognitive disorder- patient with episodes of confusion, repetitive thoughts, memory issues will benefit from neurology evaluation.  Patient has upcoming appointment.  I have spent atleast 25 minutes non face to face with patient today. More than 50 % of the time was spent for psychoeducation and supportive psychotherapy and care coordination. This note was generated in part or whole with voice recognition software. Voice recognition is usually quite accurate but there are transcription errors that can and very often do occur. I apologize for any typographical errors that were not detected and corrected.            Ursula Alert, MD 04/07/2019, 12:14 PM

## 2019-04-25 ENCOUNTER — Other Ambulatory Visit: Payer: Self-pay | Admitting: Psychiatry

## 2019-04-25 DIAGNOSIS — F411 Generalized anxiety disorder: Secondary | ICD-10-CM

## 2019-04-25 DIAGNOSIS — F410 Panic disorder [episodic paroxysmal anxiety] without agoraphobia: Secondary | ICD-10-CM

## 2019-04-25 DIAGNOSIS — F3181 Bipolar II disorder: Secondary | ICD-10-CM

## 2019-05-06 ENCOUNTER — Ambulatory Visit (INDEPENDENT_AMBULATORY_CARE_PROVIDER_SITE_OTHER): Payer: Medicare HMO | Admitting: Psychiatry

## 2019-05-06 ENCOUNTER — Other Ambulatory Visit: Payer: Self-pay

## 2019-05-06 ENCOUNTER — Encounter: Payer: Self-pay | Admitting: Psychiatry

## 2019-05-06 DIAGNOSIS — F411 Generalized anxiety disorder: Secondary | ICD-10-CM | POA: Diagnosis not present

## 2019-05-06 DIAGNOSIS — F3181 Bipolar II disorder: Secondary | ICD-10-CM | POA: Diagnosis not present

## 2019-05-06 DIAGNOSIS — F410 Panic disorder [episodic paroxysmal anxiety] without agoraphobia: Secondary | ICD-10-CM | POA: Diagnosis not present

## 2019-05-06 MED ORDER — OLANZAPINE 5 MG PO TABS: 2.5000 mg | ORAL_TABLET | Freq: Two times a day (BID) | ORAL | 1 refills | Status: DC

## 2019-05-06 NOTE — Progress Notes (Signed)
Virtual Visit via Video Note  I connected with Paige Dougherty on 05/06/19 at 11:00 AM EDT by a video enabled telemedicine application and verified that I am speaking with the correct person using two identifiers.   I discussed the limitations of evaluation and management by telemedicine and the availability of in person appointments. The patient expressed understanding and agreed to proceed.   I discussed the assessment and treatment plan with the patient. The patient was provided an opportunity to ask questions and all were answered. The patient agreed with the plan and demonstrated an understanding of the instructions.   The patient was advised to call back or seek an in-person evaluation if the symptoms worsen or if the condition fails to improve as anticipated.   [place] MD OP Progress Note  05/06/2019 12:09 PM Paige Dougherty  MRN:  [id number]  Chief Complaint:  Chief Complaint    Follow-up     HPI: Paige Dougherty is an 85 year old Caucasian female, married, lives with her husband in [place], has a history of MDD, GAD, panic attacks, insomnia, hypertension, IBS, diabetes melitis, history of breast cancer was evaluated by telemedicine today.  Patient's daughter Paige Dougherty-provided collateral information.  Patient is a limited historian.  She answered questions in short phrases only.  She appeared to be depressed during the conversation.   She did report some shakiness or tremors however that was not observed during the evaluation.  Patient reports her medications are where it needs to be.  She reports sleep is better which is an improvement for her.  She does report some memory changes however on evaluation today she appeared to be alert, oriented to person place time and situation.  She also appeared to have good recent and immediate memory.  She was able to do a simple calculation during the evaluation.  Her attention and concentration appear to be good.  Per daughter patient however has been having  some trouble with her appetite.  She is unable to tell her when she is hungry.  She was eating okay until this morning when she did not eat her breakfast.  Per daughter she also seems to be not drinking enough water.  She has to encourage her to do so.  She does have some shakiness when she tries to walk especially when she has not needed.  Per daughter it is likely due to her not eating.  Her daughter states it's not a side effect of her Celexa or the Zyprexa because she had the tremors even before the medications were initiated.  She seems to be sleeping well on the combination of Zyprexa and lorazepam at night.  Patient denies any suicidality, homicidality or perceptual disturbances.  Patient continues to report some repetitive sounds or words in her mind.  She reports she does not hear it voices however she has this urge to keep repeating the sounds that does not make sense.  She however reports she is not too obsessed about it and she is able to distract herself.  Patient denies any other concerns today.  Per daughter patient has an upcoming appointment with neurologist on October 6.  She was referred for possible cognitive changes .  Visit Diagnosis: R/O Cognitive disorder   ICD-10-CM   1. Bipolar 2 disorder, major depressive episode (HCC)  F31.81 OLANZapine (ZYPREXA) 5 MG tablet  2. GAD (generalized anxiety disorder)  F41.1   3. Panic attacks  F41.0     Past Psychiatric History: I have reviewed past psychiatric history from  my progress note on 01/06/2019.  Past trials of Lexapro, Celexa, mirtazapine, lorazepam, BuSpar, Seroquel.  Patient with recent inpatient admission at [facility] after a suicide attempt.  Past Medical History:  Past Medical History:  Diagnosis Date  . Allergy   . Anxiety   . Arthritis    HANDS/FEET  . Barrett's esophagus   . Breast cancer ([place]) 2016   RIGHT lumpectomy 2016 INVASIVE MAMMARY CARCINOMA   . Cancer of breast ([place])  12/24/2014   radiation- Right  . Chronic headache   . Depression   . Diabetes mellitus   . Diverticulosis   . Esophageal stricture   . Fibrocystic breast disease   . GERD (gastroesophageal reflux disease)   . Hyperlipemia   . Hypertension   . IBS (irritable bowel syndrome)   . Personal history of radiation therapy 2016   RIGHT lumpectomy    INVASIVE MAMMARY CARCINOMA   . Urinary incontinence     Past Surgical History:  Procedure Laterality Date  . ABDOMINAL HYSTERECTOMY    . BREAST BIOPSY Bilateral    core bxs  . BREAST EXCISIONAL BIOPSY Right 2016   +  . BREAST LUMPECTOMY Right 2016   INVASIVE MAMMARY CARCINOMA   . CARPAL TUNNEL RELEASE     bilateral  . CATARACT EXTRACTION     bilateral   . COLONOSCOPY    . DILATION AND CURETTAGE OF UTERUS    . finger cyst removal    . neck cyst removal    . UPPER GASTROINTESTINAL ENDOSCOPY      Family Psychiatric History: I have reviewed past psychiatric history from my progress note on 01/06/2019 Family History:  Family History  Problem Relation Age of Onset  . Colon cancer Father   . Thyroid disease Father   . Thyroid disease Mother   . Hypertension Mother   . Alzheimer's disease Mother   . Colon cancer Maternal Grandmother   . Breast cancer Maternal Grandmother   . Anxiety disorder Daughter     Social History: I have reviewed social history from my progress note on 01/06/2019 Social History   Socioeconomic History  . Marital status: Married    Spouse name: Not on file  . Number of children: Not on file  . Years of education: Not on file  . Highest education level: Not on file  Occupational History  . Not on file  Social Needs  . Financial resource strain: Not on file  . Food insecurity    Worry: Not on file    Inability: Not on file  . Transportation needs    Medical: Not on file    Non-medical: Not on file  Tobacco Use  . Smoking status: Never Smoker  . Smokeless tobacco: Never Used  Substance and Sexual  Activity  . Alcohol use: Yes    Comment: occasional  . Drug use: No  . Sexual activity: Not on file  Lifestyle  . Physical activity    Days per week: Not on file    Minutes per session: Not on file  . Stress: Not on file  Relationships  . Social Herbalist on phone: Not on file    Gets together: Not on file    Attends religious service: Not on file    Active member of club or organization: Not on file    Attends meetings of clubs or organizations: Not on file    Relationship status: Not on file  Other Topics Concern  .  Not on file  Social History Narrative  . Not on file    Allergies:  Allergies  Allergen Reactions  . Avapro [Irbesartan] Other (See Comments)    Other reaction(s): Unknown unknown  . Azithromycin Other (See Comments)    Extreme vaginal burning. unknown  . Buspirone Other (See Comments)    Burning sensations and felt overly hot   . Clindamycin Other (See Comments)    Vaginal burning  . Duloxetine Other (See Comments)    Hyperactivity.  . Duloxetine Hcl Other (See Comments)    Hyperactivity.  . Ezetimibe-Simvastatin Other (See Comments)    Arthralgias.  . Lipitor [Atorvastatin] Other (See Comments)    "muscle aches"  . Metronidazole Other (See Comments)  . Penicillins   . Prednisone Other (See Comments)    Dizziness/double vision  . Procaine Other (See Comments)    tremors  . Procaine Hcl     tremors  . Eggs Or Egg-Derived Products Other (See Comments)    Other Reaction: Not Assessed  . Other Other (See Comments) and Anxiety    Novacaine weakness  . Statins Rash    Arthralgias.  . Sulfa Antibiotics Rash    Vague history of a sulfa allergy, but does not remember the reaction.  . Tetracycline Rash    Metabolic Disorder Labs: Lab Results  Component Value Date   HGBA1C 6.0 (H) 03/07/2019   MPG 125.5 03/07/2019   No results found for: PROLACTIN No results found for: CHOL, TRIG, HDL, CHOLHDL, VLDL, LDLCALC Lab Results   Component Value Date   TSH 2.517 03/07/2019    Therapeutic Level Labs: No results found for: LITHIUM No results found for: VALPROATE No components found for:  CBMZ  Current Medications: Current Outpatient Medications  Medication Sig Dispense Refill  . calcium-vitamin D (OSCAL WITH D) 500-200 MG-UNIT per tablet Take 1 tablet by mouth 2 (two) times daily. 60 tablet 6  . citalopram (CELEXA) 10 MG tablet TAKE 1 TABLET BY MOUTH EVERY DAY 90 tablet 1  . enalapril (VASOTEC) 10 MG tablet Take 10 mg by mouth 2 (two) times daily.    . indomethacin (INDOCIN) 25 MG capsule Take 25 mg by mouth 3 (three) times daily as needed.    Marland Kitchen LORazepam (ATIVAN) 0.5 MG tablet Take 1 tablet (0.5 mg total) by mouth at bedtime. 30 tablet 2  . metoprolol succinate (TOPROL-XL) 50 MG 24 hr tablet Take 1 tablet (50 mg total) by mouth daily. Take with or immediately following a meal. 30 tablet 0  . OLANZapine (ZYPREXA) 5 MG tablet Take 0.5 tablets (2.5 mg total) by mouth 2 (two) times daily. 30 tablet 1  . omeprazole (PRILOSEC) 20 MG capsule Take 1 capsule (20 mg total) by mouth daily.    . Red Yeast Rice 600 MG CAPS Take by mouth.    . vitamin B-12 (CYANOCOBALAMIN) 1000 MCG tablet Take 1 tablet (1,000 mcg total) by mouth daily.     No current facility-administered medications for this visit.      Musculoskeletal: Strength & Muscle Tone: UTA Gait & Station: Walks with cane Patient leans: N/A  Psychiatric Specialty Exam: Review of Systems  Neurological: Positive for tremors.  Psychiatric/Behavioral: The patient is nervous/anxious.   All other systems reviewed and are negative.   There were no vitals taken for this visit.There is no height or weight on file to calculate BMI.  General Appearance: Casual  Eye Contact:  Fair  Speech:  Clear and Coherent  Volume:  Normal  Mood:  Anxious  Affect:  Congruent  Thought Process:  Goal Directed and Descriptions of Associations: Intact  Orientation:  Full (Time,  Place, and Person)  Thought Content: Logical   Suicidal Thoughts:  No  Homicidal Thoughts:  No  Memory:  Immediate;   Fair Recent;   Fair Remote;   Fair  Judgement:  Fair  Insight:  Fair  Psychomotor Activity:  Tremor on and off per report , however not observed   Concentration:  Concentration: Fair and Attention Span: Fair  Recall:  [employer] of Knowledge: Fair  Language: Fair  Akathisia:  No  Handed:  Right  AIMS (if indicated): does report tremors or shakiness on and off , but states she had it even before her medications were started   Assets:  Communication Skills Desire for Improvement Housing Social Support  ADL's:  Intact  Cognition: WNL  Sleep:  Fair   Screenings: PHQ2-9     Follow Up  from 12/03/2017 in [facility] [place] Follow Up  from 07/01/2016 in [facility] Follow Up  from 07/03/2015 in [facility]  PHQ-2 Total Score  0  0  0       Assessment and Plan: Paige Dougherty is a 85 year old Caucasian female, married, lives in [place], has a history of MDD, GAD, panic attacks, hypertension, IBS, diabetes, history of breast cancer was evaluated by telemedicine today.  Patient is biologically predisposed given her family history of mental health problems, her own health problems.  She does have psychosocial stressors of husband's health issues, current pandemic.  She however has very good support system from her daughter.  Patient however continues to have some lack of appetite even though overall she is making progress.  Plan as noted below.  Plan Bipolar disorder type II- improving Olanzapine 2.5 mg p.o. twice daily  Panic disorder-improving Lorazepam 0.5 mg p.o. nightly Patient was referred for CBT with therapist Chuck Hint.  However per daughter her therapist wants neurology visit to be completed before initiating treatment. Celexa 10 mg p.o. daily.  Patient  currently takes it at bedtime since she felt fatigued or tired during the day when she took it in the morning.  GAD-improving Continue CBT as needed Celexa 10 mg p.o. daily  Rule out neurocognitive disorder- patient with recent episodes of confusion repetitive thoughts, memory changes-will benefit from neurology evaluation.  Patient does have upcoming appointment with neurologist on October 6.  Collateral information was obtained from daughter-Paige Dougherty as summarized above.  Follow-up in clinic in 1 month or sooner if needed.  November 2 at 1 PM  I have spent atleast 15 minutes non face to face with patient today. More than 50 % of the time was spent for psychoeducation and supportive psychotherapy and care coordination. This note was generated in part or whole with voice recognition software. Voice recognition is usually quite accurate but there are transcription errors that can and very often do occur. I apologize for any typographical errors that were not detected and corrected.      Ursula Alert, MD 05/06/2019, 12:09 PM

## 2019-05-17 ENCOUNTER — Other Ambulatory Visit: Payer: Self-pay | Admitting: Neurology

## 2019-05-17 DIAGNOSIS — R413 Other amnesia: Secondary | ICD-10-CM

## 2019-05-27 ENCOUNTER — Other Ambulatory Visit: Payer: Self-pay

## 2019-05-27 ENCOUNTER — Ambulatory Visit
Admission: RE | Admit: 2019-05-27 | Discharge: 2019-05-27 | Disposition: A | Discharge status: Home / Self Care | Payer: Medicare HMO | Source: Ambulatory Visit | Attending: Neurology | Admitting: Neurology

## 2019-05-27 DIAGNOSIS — R413 Other amnesia: Secondary | ICD-10-CM | POA: Diagnosis not present

## 2019-06-02 ENCOUNTER — Other Ambulatory Visit: Payer: Self-pay | Admitting: Psychiatry

## 2019-06-02 DIAGNOSIS — F3181 Bipolar II disorder: Secondary | ICD-10-CM

## 2019-06-07 ENCOUNTER — Other Ambulatory Visit: Payer: Self-pay

## 2019-06-07 ENCOUNTER — Ambulatory Visit (INDEPENDENT_AMBULATORY_CARE_PROVIDER_SITE_OTHER): Payer: Medicare HMO | Admitting: Psychiatry

## 2019-06-07 ENCOUNTER — Encounter: Payer: Self-pay | Admitting: Psychiatry

## 2019-06-07 DIAGNOSIS — F410 Panic disorder [episodic paroxysmal anxiety] without agoraphobia: Secondary | ICD-10-CM

## 2019-06-07 DIAGNOSIS — F411 Generalized anxiety disorder: Secondary | ICD-10-CM | POA: Diagnosis not present

## 2019-06-07 DIAGNOSIS — F09 Unspecified mental disorder due to known physiological condition: Secondary | ICD-10-CM | POA: Insufficient documentation

## 2019-06-07 DIAGNOSIS — F3181 Bipolar II disorder: Secondary | ICD-10-CM

## 2019-06-07 NOTE — Progress Notes (Signed)
Virtual Visit via Video Note  I connected with Paige Dougherty on 06/07/19 at  1:00 PM EST by a video enabled telemedicine application and verified that I am speaking with the correct person using two identifiers.   I discussed the limitations of evaluation and management by telemedicine and the availability of in person appointments. The patient expressed understanding and agreed to proceed.    I discussed the assessment and treatment plan with the patient. The patient was provided an opportunity to ask questions and all were answered. The patient agreed with the plan and demonstrated an understanding of the instructions.   The patient was advised to call back or seek an in-person evaluation if the symptoms worsen or if the condition fails to improve as anticipated.   [facility] MD OP Progress Note  06/07/2019 3:25 PM Paige Dougherty  MRN:  [id number]  Chief Complaint:  Chief Complaint    Follow-up     HPI: Paige Dougherty is a 85 year old Caucasian female, married, lives with her husband in [place], has a history of bipolar type II disorder, GAD, panic attacks, insomnia, hypertension, IBS, diabetes melitis, history of breast cancer was evaluated by telemedicine today.  Patient's daughter [name] provided collateral information.  Patient continues to be limited historian and answer questions in short phrases only.  Patient was alert to the month and the year.  She also could tell her date of birth clearly.  Patient reports she continues to feel shaky.  She reports otherwise she is doing okay.  Per daughter patient is currently struggling with lack of motivation, possible apathy.  Her appetite is limited.  She eats only 2 meals per day.  She just sits in her chair most of the day and has to be prompted to do anything.  She had her appointment with neurologist Dr. Manuella Ghazi who believes she may have an early dementia likely Alzheimer's type.  Several testing were done including MRI, labs.  She was started on  Aricept however she passed out with the Aricept recently.  And hence it was stopped.  Per daughter patient is compliant with medications as prescribed.  Discussed readjusting the Celexa to a higher dosage however daughter would Dougherty to talk to the neurologist as well as her primary care provider for possible medication changes for her appetite as well as her memory issues initially.  Discussed with daughter about starting psychotherapy sessions.  She reports she will contact Ms. Dwyane Dee.  Also provided her other information-community resources.  Patient denies any suicidality, homicidality or perceptual disturbances. Visit Diagnosis:    ICD-10-CM   1. Bipolar 2 disorder, major depressive episode ([facility])  F31.81    improving  2. GAD (generalized anxiety disorder)  F41.1   3. Panic attacks  F41.0   4. Cognitive disorder  F09    likely mild    Past Psychiatric History: I have reviewed past psychiatric history from my progress note on 01/06/2019.  Past trials of Lexapro, Celexa, mirtazapine, lorazepam, BuSpar, Seroquel.  Patient with recent inpatient admission at [facility] after a suicide attempt.    Past Medical History:  Past Medical History:  Diagnosis Date  . Allergy   . Anxiety   . Arthritis    HANDS/FEET  . Barrett's esophagus   . Breast cancer ([facility]) 2016   RIGHT lumpectomy 2016 INVASIVE MAMMARY CARCINOMA   . Cancer of breast ([facility]) 12/24/2014   radiation- Right  . Chronic headache   . Depression   . Diabetes mellitus   .  Diverticulosis   . Esophageal stricture   . Fibrocystic breast disease   . GERD (gastroesophageal reflux disease)   . Hyperlipemia   . Hypertension   . IBS (irritable bowel syndrome)   . Personal history of radiation therapy 2016   RIGHT lumpectomy    INVASIVE MAMMARY CARCINOMA   . Urinary incontinence     Past Surgical History:  Procedure Laterality Date  . ABDOMINAL HYSTERECTOMY    . BREAST BIOPSY Bilateral     core bxs  . BREAST EXCISIONAL BIOPSY Right 2016   +  . BREAST LUMPECTOMY Right 2016   INVASIVE MAMMARY CARCINOMA   . CARPAL TUNNEL RELEASE     bilateral  . CATARACT EXTRACTION     bilateral   . COLONOSCOPY    . DILATION AND CURETTAGE OF UTERUS    . finger cyst removal    . neck cyst removal    . UPPER GASTROINTESTINAL ENDOSCOPY      Family Psychiatric History: I have reviewed family psychiatric history from my progress note on 01/06/2019  Family History:  Family History  Problem Relation Age of Onset  . Colon cancer Father   . Thyroid disease Father   . Thyroid disease Mother   . Hypertension Mother   . Alzheimer's disease Mother   . Colon cancer Maternal Grandmother   . Breast cancer Maternal Grandmother   . Anxiety disorder Daughter     Social History: Reviewed social history from my progress note on 01/06/2019 Social History   Socioeconomic History  . Marital status: Married    Spouse name: Not on file  . Number of children: Not on file  . Years of education: Not on file  . Highest education level: Not on file  Occupational History  . Not on file  Social Needs  . Financial resource strain: Not on file  . Food insecurity    Worry: Not on file    Inability: Not on file  . Transportation needs    Medical: Not on file    Non-medical: Not on file  Tobacco Use  . Smoking status: Never Smoker  . Smokeless tobacco: Never Used  Substance and Sexual Activity  . Alcohol use: Yes    Comment: occasional  . Drug use: No  . Sexual activity: Not on file  Lifestyle  . Physical activity    Days per week: Not on file    Minutes per session: Not on file  . Stress: Not on file  Relationships  . Social Herbalist on phone: Not on file    Gets together: Not on file    Attends religious service: Not on file    Active member of club or organization: Not on file    Attends meetings of clubs or organizations: Not on file    Relationship status: Not on file  Other  Topics Concern  . Not on file  Social History Narrative  . Not on file    Allergies:  Allergies  Allergen Reactions  . Avapro [Irbesartan] Other (See Comments)    Other reaction(s): Unknown unknown  . Azithromycin Other (See Comments)    Extreme vaginal burning. unknown  . Buspirone Other (See Comments)    Burning sensations and felt overly hot   . Clindamycin Other (See Comments)    Vaginal burning  . Duloxetine Other (See Comments)    Hyperactivity.  . Duloxetine Hcl Other (See Comments)    Hyperactivity.  . Ezetimibe-Simvastatin Other (See Comments)  Arthralgias.  . Lipitor [Atorvastatin] Other (See Comments)    "muscle aches"  . Metronidazole Other (See Comments)  . Penicillins   . Prednisone Other (See Comments)    Dizziness/double vision  . Procaine Other (See Comments)    tremors  . Procaine Hcl     tremors  . Eggs Or Egg-Derived Products Other (See Comments)    Other Reaction: Not Assessed  . Other Other (See Comments) and Anxiety    Novacaine weakness  . Statins Rash    Arthralgias.  . Sulfa Antibiotics Rash    Vague history of a sulfa allergy, but does not remember the reaction.  . Tetracycline Rash    Metabolic Disorder Labs: Lab Results  Component Value Date   HGBA1C 6.0 (H) 03/07/2019   MPG 125.5 03/07/2019   No results found for: PROLACTIN No results found for: CHOL, TRIG, HDL, CHOLHDL, VLDL, LDLCALC Lab Results  Component Value Date   TSH 2.517 03/07/2019    Therapeutic Level Labs: No results found for: LITHIUM No results found for: VALPROATE No components found for:  CBMZ  Current Medications: Current Outpatient Medications  Medication Sig Dispense Refill  . calcium-vitamin D (OSCAL WITH D) 500-200 MG-UNIT per tablet Take 1 tablet by mouth 2 (two) times daily. 60 tablet 6  . citalopram (CELEXA) 10 MG tablet TAKE 1 TABLET BY MOUTH EVERY DAY 90 tablet 1  . enalapril (VASOTEC) 10 MG tablet Take 10 mg by mouth 2 (two) times daily.     . indomethacin (INDOCIN) 25 MG capsule Take 25 mg by mouth 3 (three) times daily as needed.    Marland Kitchen LORazepam (ATIVAN) 0.5 MG tablet Take 1 tablet (0.5 mg total) by mouth at bedtime. 30 tablet 2  . metoprolol succinate (TOPROL-XL) 50 MG 24 hr tablet Take 1 tablet (50 mg total) by mouth daily. Take with or immediately following a meal. 30 tablet 0  . OLANZapine (ZYPREXA) 5 MG tablet TAKE 0.5 TABLETS (2.5 MG TOTAL) BY MOUTH 2 (TWO) TIMES DAILY. 90 tablet 1  . omeprazole (PRILOSEC) 20 MG capsule Take 1 capsule (20 mg total) by mouth daily.    . Red Yeast Rice 600 MG CAPS Take by mouth.    . vitamin B-12 (CYANOCOBALAMIN) 1000 MCG tablet Take 1 tablet (1,000 mcg total) by mouth daily.     No current facility-administered medications for this visit.      Musculoskeletal: Strength & Muscle Tone: UTA Gait & Station: Observed as seated Patient leans: N/A  Psychiatric Specialty Exam: Review of Systems  Psychiatric/Behavioral: The patient is nervous/anxious.   All other systems reviewed and are negative.   There were no vitals taken for this visit.There is no height or weight on file to calculate BMI.  General Appearance: Casual  Eye Contact:  Poor  Speech:  Normal Rate  Volume:  Normal  Mood:  Anxious  Affect:  Congruent  Thought Process:  Linear and Descriptions of Associations: Intact  Orientation:  Full (Time, Place, and Person)  Thought Content: logical   Suicidal Thoughts:  No  Homicidal Thoughts:  No  Memory:  Immediate;   Fair Recent;   limited  Remote;   limited  Judgement:  Fair  Insight:  Fair  Psychomotor Activity:  Tremor on and off as per report  Concentration:  Concentration: Fair and Attention Span: Fair  Recall:  [employer] of Knowledge: Fair  Language: Fair  Akathisia:  No  Handed:  Right  AIMS (if indicated):   Assets:  Communication Skills Desire for Improvement Social Support  ADL's:  Intact  Cognition: Impaired,  Mild  Sleep:  Fair   Screenings: PHQ2-9      Follow Up  from 12/03/2017 in [facility] Follow Up  from 07/01/2016 in [facility] Follow Up  from 07/03/2015 in [facility]  PHQ-2 Total Score  0  0  0       Assessment and Plan: Aayra is a 85 year old Caucasian female, married, lives in [place], has a history of MDD, GAD, panic attacks, hypertension, IBS, diabetes, history of breast cancer was evaluated by telemedicine today.  Patient is biologically predisposed given her family history of mental health problems, her own health problems.  She also has psychosocial stressors of husband's health issues, current pandemic.  Patient currently is struggling with possible apathy likely due to her early dementia.  Patient continues to work with neurology.  Discussed plan with patient as well as daughter.  Plan Bipolar disorder type II-improving Olanzapine 2.5 mg p.o. twice daily  Panic disorder-improving Lorazepam 0.5 mg p.o. nightly Discussed to connect with therapist Ms. Delma Post for CBT Discussed with patient as well as daughter that this is what neurology also recommended to start CBT.  Also provided information for community resources [place]herty [facility] [place] as well as pace program. Celexa 10 mg p.o. daily.  Discussed readjusting the dosage however daughter as well as patient would Dougherty to wait.  GAD-improving Encouraged to start CBT Celexa 10 mg p.o. daily  Cognitive disorder likely mild-patient had neurology appointment recently with Dr. Manuella Ghazi. Patient had multiple work-up done and was started on Aricept.  However she did not tolerate Aricept and it had to be discontinued.  Patient with early dementia-Alzheimer's type will benefit from medication readjustment.  Advised daughter as well as patient to contact neurology.  Patient with appetite reduction-daughter currently wants to wait and wants to connect with primary care  provider to see if patient can be started on an appetite stimulant Dougherty Megace.  Follow-up in clinic in 1 month or sooner if needed.  December 2 at 9:30 AM I have spent atleast 15 minutes non face to face with patient today. More than 50 % of the time was spent for psychoeducation and supportive psychotherapy and care coordination. This note was generated in part or whole with voice recognition software. Voice recognition is usually quite accurate but there are transcription errors that can and very often do occur. I apologize for any typographical errors that were not detected and corrected.       Ursula Alert, MD 06/07/2019, 3:25 PM

## 2019-06-09 ENCOUNTER — Telehealth: Payer: Self-pay

## 2019-06-09 NOTE — Telephone Encounter (Signed)
Returned call to [facility] that since she is already on olanzapine and Celexa would not recommend adding mirtazapine also.  However could taper her off of the Celexa and try the mirtazapine.  She however has tried mirtazapine in the past and it was not very beneficial at that point.  [name] would like to go back to primary care provider and discuss another appetite stimulant.  She will call back if she has any more concerns.

## 2019-06-09 NOTE — Telephone Encounter (Signed)
pcp bert cline at [facility] wanted to know if she can take the mirtazipine.  if so please send him an email and then also let her know if it ok so she can followup with pcp

## 2019-06-22 ENCOUNTER — Ambulatory Visit: Payer: Medicare HMO | Admitting: Physical Therapy

## 2019-06-22 ENCOUNTER — Telehealth: Payer: Self-pay

## 2019-06-22 NOTE — Telephone Encounter (Signed)
received office notes from [facility] - neuro.

## 2019-06-24 ENCOUNTER — Ambulatory Visit: Payer: Medicare HMO | Admitting: Physical Therapy

## 2019-06-29 ENCOUNTER — Ambulatory Visit: Payer: Medicare HMO | Admitting: Physical Therapy

## 2019-06-29 DIAGNOSIS — E441 Mild protein-calorie malnutrition: Secondary | ICD-10-CM | POA: Insufficient documentation

## 2019-07-06 ENCOUNTER — Ambulatory Visit: Payer: Medicare HMO | Admitting: Physical Therapy

## 2019-07-07 ENCOUNTER — Ambulatory Visit (INDEPENDENT_AMBULATORY_CARE_PROVIDER_SITE_OTHER): Payer: Medicare HMO | Admitting: Psychiatry

## 2019-07-07 ENCOUNTER — Encounter: Payer: Self-pay | Admitting: Psychiatry

## 2019-07-07 ENCOUNTER — Other Ambulatory Visit: Payer: Self-pay

## 2019-07-07 DIAGNOSIS — F410 Panic disorder [episodic paroxysmal anxiety] without agoraphobia: Secondary | ICD-10-CM | POA: Diagnosis not present

## 2019-07-07 DIAGNOSIS — F3175 Bipolar disorder, in partial remission, most recent episode depressed: Secondary | ICD-10-CM | POA: Diagnosis not present

## 2019-07-07 DIAGNOSIS — F411 Generalized anxiety disorder: Secondary | ICD-10-CM

## 2019-07-07 DIAGNOSIS — F09 Unspecified mental disorder due to known physiological condition: Secondary | ICD-10-CM

## 2019-07-07 MED ORDER — LORAZEPAM 0.5 MG PO TABS: 0.5000 mg | ORAL_TABLET | Freq: Every day | ORAL | 2 refills | Status: DC

## 2019-07-07 NOTE — Progress Notes (Signed)
Virtual Visit via Video Note  I connected with Paige Dougherty on 07/07/19 at  9:30 AM EST by a video enabled telemedicine application and verified that I am speaking with the correct person using two identifiers.   I discussed the limitations of evaluation and management by telemedicine and the availability of in person appointments. The patient expressed understanding and agreed to proceed.      I discussed the assessment and treatment plan with the patient. The patient was provided an opportunity to ask questions and all were answered. The patient agreed with the plan and demonstrated an understanding of the instructions.   The patient was advised to call back or seek an in-person evaluation if the symptoms worsen or if the condition fails to improve as anticipated.  [place] MD OP Progress Note  07/07/2019 11:39 AM TEXIE TAL  MRN:  [id number]  Chief Complaint:  Chief Complaint    Follow-up     HPI: Paige Dougherty is an 85 year old Caucasian female, married, lives with her husband in [place], has a history of bipolar type II disorder, GAD, panic attacks, insomnia, hypertension, IBS, diabetes melitis, history of breast cancer, cognitive disorder was evaluated by telemedicine today.  Patient's daughter [name] provided collateral information.  Patient continues to be a limited historian.  She however was able to answer questions when asked although in short phrases.  She appeared to be alert and oriented to self and situation.  Patient reports she feels shaky however feels better than before.  She rates her depression at a 4 out of 10 today, 10 being the worst.  That is also an improvement.  She reports sleep is good.  She reports appetite is improving.  Per Monica-daughter patient was having trouble with appetite and was struggling with possible catatonic symptoms few weeks ago.  She reports she was recently started on Namenda by her neurologist.  She was tried on several medications and  recently started on prednisone for appetite by her primary care provider.  These 2 medication changes made some changes in her appetite and her mood.  She used to drink only smoothies and had lost around 15 pounds since October.  However since the past few days she has started eating solid food again.  Patient also was having falls recently.  However her antihypertensive medication-enalapril was discontinued and she currently uses a walker.  Her falls hands have improved.  She also started getting physical therapy, that also seems to be helping.  Patient denies any suicidality, homicidality or perceptual disturbances.  Patient denies any other concerns today. Visit Diagnosis:    ICD-10-CM   1. Bipolar disorder, in partial remission, most recent episode depressed (HCC)  F31.75 LORazepam (ATIVAN) 0.5 MG tablet   type 2  2. GAD (generalized anxiety disorder)  F41.1 LORazepam (ATIVAN) 0.5 MG tablet  3. Panic attacks  F41.0 LORazepam (ATIVAN) 0.5 MG tablet  4. Cognitive disorder  F09     Past Psychiatric History: Reviewed past psychiatric history from my progress note on 01/06/2019.  Past trials of Lexapro, Celexa, mirtazapine, lorazepam, BuSpar, Seroquel.  Patient with recent inpatient admission at [facility], [place] after a suicide attempt.  Past Medical History:  Past Medical History:  Diagnosis Date  . Allergy   . Anxiety   . Arthritis    HANDS/FEET  . Barrett's esophagus   . Breast cancer ([place]) 2016   RIGHT lumpectomy 2016 INVASIVE MAMMARY CARCINOMA   . Cancer of breast ([place]) 12/24/2014   radiation- Right  .  Chronic headache   . Depression   . Diabetes mellitus   . Diverticulosis   . Esophageal stricture   . Fibrocystic breast disease   . GERD (gastroesophageal reflux disease)   . Hyperlipemia   . Hypertension   . IBS (irritable bowel syndrome)   . Personal history of radiation therapy 2016   RIGHT lumpectomy    INVASIVE MAMMARY CARCINOMA   . Urinary  incontinence     Past Surgical History:  Procedure Laterality Date  . ABDOMINAL HYSTERECTOMY    . BREAST BIOPSY Bilateral    core bxs  . BREAST EXCISIONAL BIOPSY Right 2016   +  . BREAST LUMPECTOMY Right 2016   INVASIVE MAMMARY CARCINOMA   . CARPAL TUNNEL RELEASE     bilateral  . CATARACT EXTRACTION     bilateral   . COLONOSCOPY    . DILATION AND CURETTAGE OF UTERUS    . finger cyst removal    . neck cyst removal    . UPPER GASTROINTESTINAL ENDOSCOPY      Family Psychiatric History: I have reviewed family psychiatric history from my progress note on 01/06/2019.  Family History:  Family History  Problem Relation Age of Onset  . Colon cancer Father   . Thyroid disease Father   . Thyroid disease Mother   . Hypertension Mother   . Alzheimer's disease Mother   . Colon cancer Maternal Grandmother   . Breast cancer Maternal Grandmother   . Anxiety disorder Daughter     Social History: I have reviewed social history from my progress note on 01/06/2019. Social History   Socioeconomic History  . Marital status: Married    Spouse name: Not on file  . Number of children: Not on file  . Years of education: Not on file  . Highest education level: Not on file  Occupational History  . Not on file  Social Needs  . Financial resource strain: Not on file  . Food insecurity    Worry: Not on file    Inability: Not on file  . Transportation needs    Medical: Not on file    Non-medical: Not on file  Tobacco Use  . Smoking status: Never Smoker  . Smokeless tobacco: Never Used  Substance and Sexual Activity  . Alcohol use: Yes    Comment: occasional  . Drug use: No  . Sexual activity: Not on file  Lifestyle  . Physical activity    Days per week: Not on file    Minutes per session: Not on file  . Stress: Not on file  Relationships  . Social Herbalist on phone: Not on file    Gets together: Not on file    Attends religious service: Not on file    Active member of  club or organization: Not on file    Attends meetings of clubs or organizations: Not on file    Relationship status: Not on file  Other Topics Concern  . Not on file  Social History Narrative  . Not on file    Allergies:  Allergies  Allergen Reactions  . Avapro [Irbesartan] Other (See Comments)    Other reaction(s): Unknown unknown  . Azithromycin Other (See Comments)    Extreme vaginal burning. unknown  . Buspirone Other (See Comments)    Burning sensations and felt overly hot   . Clindamycin Other (See Comments)    Vaginal burning  . Duloxetine Other (See Comments)    Hyperactivity.  Marland Kitchen  Duloxetine Hcl Other (See Comments)    Hyperactivity.  . Ezetimibe-Simvastatin Other (See Comments)    Arthralgias.  . Lipitor [Atorvastatin] Other (See Comments)    "muscle aches"  . Metronidazole Other (See Comments)  . Penicillins   . Prednisone Other (See Comments)    Dizziness/double vision  . Procaine Other (See Comments)    tremors  . Procaine Hcl     tremors  . Eggs Or Egg-Derived Products Other (See Comments)    Other Reaction: Not Assessed  . Other Other (See Comments) and Anxiety    Novacaine weakness  . Statins Rash    Arthralgias.  . Sulfa Antibiotics Rash    Vague history of a sulfa allergy, but does not remember the reaction.  . Tetracycline Rash    Metabolic Disorder Labs: Lab Results  Component Value Date   HGBA1C 6.0 (H) 03/07/2019   MPG 125.5 03/07/2019   No results found for: PROLACTIN No results found for: CHOL, TRIG, HDL, CHOLHDL, VLDL, LDLCALC Lab Results  Component Value Date   TSH 2.517 03/07/2019    Therapeutic Level Labs: No results found for: LITHIUM No results found for: VALPROATE No components found for:  CBMZ  Current Medications: Current Outpatient Medications  Medication Sig Dispense Refill  . predniSONE (DELTASONE) 5 MG tablet Take by mouth.    . calcium-vitamin D (OSCAL WITH D) 500-200 MG-UNIT per tablet Take 1 tablet by mouth  2 (two) times daily. 60 tablet 6  . citalopram (CELEXA) 10 MG tablet TAKE 1 TABLET BY MOUTH EVERY DAY 90 tablet 1  . enalapril (VASOTEC) 10 MG tablet Take 10 mg by mouth 2 (two) times daily.    . indomethacin (INDOCIN) 25 MG capsule Take 25 mg by mouth 3 (three) times daily as needed.    Marland Kitchen LORazepam (ATIVAN) 0.5 MG tablet Take 1 tablet (0.5 mg total) by mouth at bedtime. 30 tablet 2  . memantine (NAMENDA) 5 MG tablet Take 5 mg by mouth 2 (two) times daily.    . metoprolol succinate (TOPROL-XL) 50 MG 24 hr tablet Take 1 tablet (50 mg total) by mouth daily. Take with or immediately following a meal. 30 tablet 0  . OLANZapine (ZYPREXA) 5 MG tablet TAKE 0.5 TABLETS (2.5 MG TOTAL) BY MOUTH 2 (TWO) TIMES DAILY. 90 tablet 1  . omeprazole (PRILOSEC) 20 MG capsule Take 1 capsule (20 mg total) by mouth daily.    . predniSONE (DELTASONE) 5 MG tablet Take 5 mg by mouth daily.    . Red Yeast Rice 600 MG CAPS Take by mouth.    . vitamin B-12 (CYANOCOBALAMIN) 1000 MCG tablet Take 1 tablet (1,000 mcg total) by mouth daily.     No current facility-administered medications for this visit.      Musculoskeletal: Strength & Muscle Tone: UTA Gait & Station: Uses a walker Patient leans: N/A  Psychiatric Specialty Exam: Review of Systems  Psychiatric/Behavioral: The patient is nervous/anxious.   All other systems reviewed and are negative.   There were no vitals taken for this visit.There is no height or weight on file to calculate BMI.  General Appearance: Casual  Eye Contact:  Fair  Speech:  Clear and Coherent  Volume:  Normal  Mood:  Anxious Improving  Affect:  Appropriate  Thought Process:  Goal Directed and Descriptions of Associations: Intact  Orientation:  Full (Time, Place, and Person)  Thought Content: Logical   Suicidal Thoughts:  No  Homicidal Thoughts:  No  Memory:  Immediate;  limited Recent;   limited Remote;   limited  Judgement:  Fair  Insight:  Fair  Psychomotor Activity:   Normal  Concentration:  Concentration: Fair and Attention Span: Fair  Recall:  [employer] of Knowledge: Fair  Language: Fair  Akathisia:  No  Handed:  Right  AIMS (if indicated):UTA  Assets:  Chief Executive Officer Social Support  ADL's:  Intact  Cognition: Impaired,  Mild  Sleep:  Fair   Screenings: PHQ2-9     Follow Up  from 12/03/2017 in [place] Follow Up  from 07/01/2016 in [place] Follow Up  from 07/03/2015 in [facility]  PHQ-2 Total Score  0  0  0       Assessment and Plan: Paige Dougherty is a 85 year old Caucasian female, married, lives in [place], has a history of MDD, GAD, panic attacks, hypertension, IBS, diabetes, history of breast cancer was evaluated by telemedicine today.  Patient is biologically predisposed given her family history of mental health problems, her own health problems.  She also has psychosocial stressors of husband's health issue, current pandemic.  Patient is currently making some progress with regards to her mood.  She continues to work with neurology, physical therapist and her primary care provider. Plan as noted below.  Plan Bipolar disorder type II in partial remission Olanzapine 2.5 mg p.o. twice daily   Panic disorder-improving Lorazepam 0.5 mg p.o. nightly Patient was referred to therapist-Ms. Dwyane Dee for CBT-pending Celexa 10 mg p.o. daily  GAD-stable Celexa 10 mg p.o. daily  Cognitive disorder likely mild-patient will continue to work with her neurologist Dr. Manuella Ghazi Patient is currently on Namenda 5 mg p.o. bid.   Collateral information obtained from daughter as summarized above.  Follow-up in clinic in 1 to 2 months or sooner if needed.  January 20 at 10 AM  I have spent atleast 15 minutes non face to face with patient today. More than 50 % of the time was spent for psychoeducation and supportive  psychotherapy and care coordination. This note was generated in part or whole with voice recognition software. Voice recognition is usually quite accurate but there are transcription errors that can and very often do occur. I apologize for any typographical errors that were not detected and corrected.         Ursula Alert, MD 07/07/2019, 11:39 AM

## 2019-07-08 ENCOUNTER — Ambulatory Visit: Payer: Medicare HMO | Admitting: Physical Therapy

## 2019-07-13 ENCOUNTER — Ambulatory Visit: Payer: Medicare HMO | Admitting: Physical Therapy

## 2019-07-15 ENCOUNTER — Ambulatory Visit: Payer: Medicare HMO | Admitting: Physical Therapy

## 2019-07-20 ENCOUNTER — Ambulatory Visit: Payer: Medicare HMO | Admitting: Physical Therapy

## 2019-07-22 ENCOUNTER — Ambulatory Visit: Payer: Medicare HMO | Admitting: Physical Therapy

## 2019-07-26 DIAGNOSIS — M791 Myalgia, unspecified site: Secondary | ICD-10-CM | POA: Insufficient documentation

## 2019-07-27 ENCOUNTER — Ambulatory Visit: Payer: Medicare HMO | Admitting: Physical Therapy

## 2019-07-29 ENCOUNTER — Ambulatory Visit: Payer: Medicare HMO | Admitting: Physical Therapy

## 2019-08-03 ENCOUNTER — Ambulatory Visit: Payer: Medicare HMO | Admitting: Physical Therapy

## 2019-08-25 ENCOUNTER — Ambulatory Visit (INDEPENDENT_AMBULATORY_CARE_PROVIDER_SITE_OTHER): Payer: Medicare HMO | Admitting: Psychiatry

## 2019-08-25 ENCOUNTER — Encounter: Payer: Self-pay | Admitting: Psychiatry

## 2019-08-25 ENCOUNTER — Other Ambulatory Visit: Payer: Self-pay

## 2019-08-25 DIAGNOSIS — F410 Panic disorder [episodic paroxysmal anxiety] without agoraphobia: Secondary | ICD-10-CM

## 2019-08-25 DIAGNOSIS — F411 Generalized anxiety disorder: Secondary | ICD-10-CM

## 2019-08-25 DIAGNOSIS — F3175 Bipolar disorder, in partial remission, most recent episode depressed: Secondary | ICD-10-CM | POA: Diagnosis not present

## 2019-08-25 DIAGNOSIS — F09 Unspecified mental disorder due to known physiological condition: Secondary | ICD-10-CM | POA: Diagnosis not present

## 2019-08-25 MED ORDER — OLANZAPINE 5 MG PO TABS: 2.5000 mg | ORAL_TABLET | Freq: Every day | ORAL | 1 refills | Status: DC

## 2019-08-25 NOTE — Progress Notes (Signed)
Provider Location : [facility] Patient Location : Home   Virtual Visit via Video Note  I connected with Paige Dougherty on 08/25/19 at 10:00 AM EST by a video enabled telemedicine application and verified that I am speaking with the correct person using two identifiers.   I discussed the limitations of evaluation and management by telemedicine and the availability of in person appointments. The patient expressed understanding and agreed to proceed.     I discussed the assessment and treatment plan with the patient. The patient was provided an opportunity to ask questions and all were answered. The patient agreed with the plan and demonstrated an understanding of the instructions.   The patient was advised to call back or seek an in-person evaluation if the symptoms worsen or if the condition fails to improve as anticipated.   [facility] MD OP Progress Note  08/25/2019 12:26 PM Paige Dougherty  MRN:  [id number]  Chief Complaint:  Chief Complaint    Follow-up     HPI: Paige Dougherty is an 86 year old Caucasian female, married, lives with her husband in [place], has a history of bipolar disorder, GAD, panic attacks, insomnia, hypertension, IBS, diabetes melitis, history of breast cancer, cognitive disorder was evaluated by telemedicine today.  Collateral information was obtained from [place].  Patient continues to be limited historian.  She kept her eyes closed the entire session.  She did not participate much.  Patient was able to answer questions in short phrases when prompted.  She appeared to be alert, oriented to person place time and situation.  Patient was also able to tell writer her date of birth as well as address.  Patient reports she does not feel good however does not elaborate more than that.  Per daughter patient seems to be not engaging throughout the day on a daily basis.  She just sits and keeps her eyes closed.  Her appetite seems to have improved.  She needs to be prompted to do  things.  Her daughter does assist her with prompting her to take her showers and changing her clothes which she is able to do herself with some support.  She has gained a pound since her last appointment with Probation officer and that is an improvement.  The prednisone does seem to help improve her appetite.  She is sleeping well.  Daughter however states she likes dark rooms and keeps her eyes closed in the light.  It is likely she may have some problems with her vision.  Daughter agrees to get in touch with her eye doctor.  Discussed reducing her Zyprexa to just in the evening to help with possible drowsiness and fatigue during the day.  Also discussed the interaction between Zyprexa and lorazepam.  We will continue to taper it down.  Encouraged to start psychotherapy sessions, daughter reports she will get in touch with therapist Ms. Dwyane Dee to have a few session.  Patient also has upcoming appointment with neurology.   Visit Diagnosis:    ICD-10-CM   1. Bipolar disorder, in partial remission, most recent episode depressed ([place])  F31.75   2. GAD (generalized anxiety disorder)  F41.1 OLANZapine (ZYPREXA) 5 MG tablet  3. Panic attacks  F41.0   4. Cognitive disorder  F09     Past Psychiatric History: Reviewed past psychiatric history from my progress note on 01/06/2019.  Past trials of Lexapro, Celexa, mirtazapine, lorazepam, BuSpar, Seroquel.  Patient with recent inpatient admission at [facility], [place] after a suicide attempt.  Past  Medical History:  Past Medical History:  Diagnosis Date  . Allergy   . Anxiety   . Arthritis    HANDS/FEET  . Barrett's esophagus   . Breast cancer ([place]) 2016   RIGHT lumpectomy 2016 INVASIVE MAMMARY CARCINOMA   . Cancer of breast ([place]) 12/24/2014   radiation- Right  . Chronic headache   . Depression   . Diabetes mellitus   . Diverticulosis   . Esophageal stricture   . Fibrocystic breast disease   . GERD (gastroesophageal reflux  disease)   . Hyperlipemia   . Hypertension   . IBS (irritable bowel syndrome)   . Personal history of radiation therapy 2016   RIGHT lumpectomy    INVASIVE MAMMARY CARCINOMA   . Urinary incontinence     Past Surgical History:  Procedure Laterality Date  . ABDOMINAL HYSTERECTOMY    . BREAST BIOPSY Bilateral    core bxs  . BREAST EXCISIONAL BIOPSY Right 2016   +  . BREAST LUMPECTOMY Right 2016   INVASIVE MAMMARY CARCINOMA   . CARPAL TUNNEL RELEASE     bilateral  . CATARACT EXTRACTION     bilateral   . COLONOSCOPY    . DILATION AND CURETTAGE OF UTERUS    . finger cyst removal    . neck cyst removal    . UPPER GASTROINTESTINAL ENDOSCOPY      Family Psychiatric History: Reviewed family psychiatric history from my progress note on 01/06/2019.  Family History:  Family History  Problem Relation Age of Onset  . Colon cancer Father   . Thyroid disease Father   . Thyroid disease Mother   . Hypertension Mother   . Alzheimer's disease Mother   . Colon cancer Maternal Grandmother   . Breast cancer Maternal Grandmother   . Anxiety disorder Daughter     Social History: Reviewed social history from my progress note on 01/06/2019. Social History   Socioeconomic History  . Marital status: Married    Spouse name: Not on file  . Number of children: Not on file  . Years of education: Not on file  . Highest education level: Not on file  Occupational History  . Not on file  Tobacco Use  . Smoking status: Never Smoker  . Smokeless tobacco: Never Used  Substance and Sexual Activity  . Alcohol use: Yes    Comment: occasional  . Drug use: No  . Sexual activity: Not on file  Other Topics Concern  . Not on file  Social History Narrative  . Not on file   Social Determinants of Health   Financial Resource Strain:   . Difficulty of Paying Living Expenses: Not on file  Food Insecurity:   . Worried About Charity fundraiser in the Last Year: Not on file  . Ran Out of Food in the  Last Year: Not on file  Transportation Needs:   . Lack of Transportation (Medical): Not on file  . Lack of Transportation (Non-Medical): Not on file  Physical Activity:   . Days of Exercise per Week: Not on file  . Minutes of Exercise per Session: Not on file  Stress:   . Feeling of Stress : Not on file  Social Connections:   . Frequency of Communication with Friends and Family: Not on file  . Frequency of Social Gatherings with Friends and Family: Not on file  . Attends Religious Services: Not on file  . Active Member of Clubs or Organizations: Not on file  . Attends  Club or Organization Meetings: Not on file  . Marital Status: Not on file    Allergies:  Allergies  Allergen Reactions  . Avapro [Irbesartan] Other (See Comments)    Other reaction(s): Unknown unknown  . Azithromycin Other (See Comments)    Extreme vaginal burning. unknown  . Buspirone Other (See Comments)    Burning sensations and felt overly hot   . Clindamycin Other (See Comments)    Vaginal burning  . Duloxetine Other (See Comments)    Hyperactivity.  . Duloxetine Hcl Other (See Comments)    Hyperactivity.  . Ezetimibe-Simvastatin Other (See Comments)    Arthralgias.  . Lipitor [Atorvastatin] Other (See Comments)    "muscle aches"  . Metronidazole Other (See Comments)  . Penicillins   . Prednisone Other (See Comments)    Dizziness/double vision  . Procaine Other (See Comments)    tremors  . Procaine Hcl     tremors  . Eggs Or Egg-Derived Products Other (See Comments)    Other Reaction: Not Assessed  . Other Other (See Comments) and Anxiety    Novacaine weakness  . Statins Rash    Arthralgias.  . Sulfa Antibiotics Rash    Vague history of a sulfa allergy, but does not remember the reaction.  . Tetracycline Rash    Metabolic Disorder Labs: Lab Results  Component Value Date   HGBA1C 6.0 (H) 03/07/2019   MPG 125.5 03/07/2019   No results found for: PROLACTIN No results found for: CHOL,  TRIG, HDL, CHOLHDL, VLDL, LDLCALC Lab Results  Component Value Date   TSH 2.517 03/07/2019    Therapeutic Level Labs: No results found for: LITHIUM No results found for: VALPROATE No components found for:  CBMZ  Current Medications: Current Outpatient Medications  Medication Sig Dispense Refill  . calcium-vitamin D (OSCAL WITH D) 500-200 MG-UNIT per tablet Take 1 tablet by mouth 2 (two) times daily. 60 tablet 6  . citalopram (CELEXA) 10 MG tablet TAKE 1 TABLET BY MOUTH EVERY DAY 90 tablet 1  . enalapril (VASOTEC) 10 MG tablet Take 10 mg by mouth 2 (two) times daily.    . indomethacin (INDOCIN) 25 MG capsule Take 25 mg by mouth 3 (three) times daily as needed.    Marland Kitchen LORazepam (ATIVAN) 0.5 MG tablet Take 1 tablet (0.5 mg total) by mouth at bedtime. 30 tablet 2  . memantine (NAMENDA) 5 MG tablet Take 5 mg by mouth 2 (two) times daily.    . metoprolol succinate (TOPROL-XL) 50 MG 24 hr tablet Take 1 tablet (50 mg total) by mouth daily. Take with or immediately following a meal. 30 tablet 0  . OLANZapine (ZYPREXA) 5 MG tablet Take 0.5 tablets (2.5 mg total) by mouth daily. Has supplies 90 tablet 1  . omeprazole (PRILOSEC) 20 MG capsule Take 1 capsule (20 mg total) by mouth daily.    . predniSONE (DELTASONE) 5 MG tablet Take by mouth.    . predniSONE (DELTASONE) 5 MG tablet Take 5 mg by mouth daily.    . Red Yeast Rice 600 MG CAPS Take by mouth.    . vitamin B-12 (CYANOCOBALAMIN) 1000 MCG tablet Take 1 tablet (1,000 mcg total) by mouth daily.     No current facility-administered medications for this visit.     Musculoskeletal: Strength & Muscle Tone: UTA Gait & Station: Walks with walker Patient leans: N/A  Psychiatric Specialty Exam: Review of Systems  Psychiatric/Behavioral:       Reports she does not feel good  All  other systems reviewed and are negative.   There were no vitals taken for this visit.There is no height or weight on file to calculate BMI.  General Appearance:  Casual  Eye Contact:  kept eyes closed  Speech:  Slow  Volume:  Normal  Mood:  states " I do not feel so good."  Affect:  Flat  Thought Process:  Linear and Descriptions of Associations: Intact  Orientation:  Full (Time, Place, and Person)  Thought Content: Logical   Suicidal Thoughts:  No  Homicidal Thoughts:  No  Memory:  Immediate;   Fair Recent;   Fair Remote;   limited  Judgement:  Fair  Insight:  Fair  Psychomotor Activity:  Decreased  Concentration:  Concentration: Fair and Attention Span: Fair  Recall:  [employer] of Knowledge: Fair  Language: Fair  Akathisia:  No  Handed:  Right  AIMS (if indicated):UTA  Assets:  Housing Social Support  ADL's:  Intact  Cognition: Impaired,  Moderate  Sleep:  Fair   Screenings: PHQ2-9     Follow Up  from 12/03/2017 in [facility] Follow Up  from 07/01/2016 in [facility] Follow Up  from 07/03/2015 in [facility]  PHQ-2 Total Score  0  0  0       Assessment and Plan: Paige Dougherty is a 86 year old Caucasian female, married, lives in [place], has a history of MDD, GAD, panic attacks, hypertension, IBS, diabetes, history of breast cancer was evaluated by telemedicine today.  Patient is biologically predisposed given her family history of mental health problems, her own health problems.  She has psychosocial stressors of the current pandemic.  She continues to struggle with possible depressive symptoms, apathy, side effects to medications.  Plan as noted below.  Plan Bipolar disorder type II in partial remission Reduce olanzapine to 2.5 mg p.o. daily. The dose has been reduced due to possible interaction with lorazepam as well as her adverse side effects.  Panic disorder-improving Lorazepam 0.5 mg p.o. nightly Celexa 10 mg p.o. daily   GAD-stable Celexa 10 mg p.o. daily  Cognitive disorder likely mild-patient will  continue to work with her neurologist Continue Namenda 5 mg p.o. twice daily  Collateral information was obtained from daughter as summarized above  Discussed with patient as well as daughter to start CBT-to address her current behavioral problems.  Follow-up in clinic in 4 weeks or sooner if needed.  February 19 at 10:30 AM  I have spent atleast 30 minutes non face to face with patient today. More than 50 % of the time was spent for  obtaining and to review and separately obtained history , ordering medications and test ,psychoeducation and supportive psychotherapy and care coordination,as well as documenting clinical information in electronic health record. This note was generated in part or whole with voice recognition software. Voice recognition is usually quite accurate but there are transcription errors that can and very often do occur. I apologize for any typographical errors that were not detected and corrected.        Ursula Alert, MD 08/25/2019, 12:26 PM

## 2019-09-24 ENCOUNTER — Ambulatory Visit: Payer: Medicare HMO | Admitting: Psychiatry

## 2019-10-13 ENCOUNTER — Ambulatory Visit (INDEPENDENT_AMBULATORY_CARE_PROVIDER_SITE_OTHER): Payer: Medicare HMO | Admitting: Psychiatry

## 2019-10-13 ENCOUNTER — Other Ambulatory Visit: Payer: Self-pay

## 2019-10-13 ENCOUNTER — Encounter: Payer: Self-pay | Admitting: Psychiatry

## 2019-10-13 DIAGNOSIS — F411 Generalized anxiety disorder: Secondary | ICD-10-CM | POA: Diagnosis not present

## 2019-10-13 DIAGNOSIS — F410 Panic disorder [episodic paroxysmal anxiety] without agoraphobia: Secondary | ICD-10-CM | POA: Diagnosis not present

## 2019-10-13 DIAGNOSIS — G309 Alzheimer's disease, unspecified: Secondary | ICD-10-CM | POA: Diagnosis not present

## 2019-10-13 DIAGNOSIS — F3181 Bipolar II disorder: Secondary | ICD-10-CM

## 2019-10-13 DIAGNOSIS — F0280 Dementia in other diseases classified elsewhere without behavioral disturbance: Secondary | ICD-10-CM

## 2019-10-13 DIAGNOSIS — F3175 Bipolar disorder, in partial remission, most recent episode depressed: Secondary | ICD-10-CM | POA: Diagnosis not present

## 2019-10-13 MED ORDER — CITALOPRAM HYDROBROMIDE 10 MG PO TABS: 10.0000 mg | ORAL_TABLET | Freq: Every day | ORAL | 0 refills | Status: DC

## 2019-10-13 MED ORDER — LORAZEPAM 0.5 MG PO TABS: 0.5000 mg | ORAL_TABLET | Freq: Every day | ORAL | 2 refills | Status: DC

## 2019-10-13 NOTE — Progress Notes (Signed)
Provider Location : [facility] Patient Location : Home  Virtual Visit via Video Note  I connected with Paige Dougherty on 10/13/19 at  4:30 PM EST by a video enabled telemedicine application and verified that I am speaking with the correct person using two identifiers.   I discussed the limitations of evaluation and management by telemedicine and the availability of in person appointments. The patient expressed understanding and agreed to proceed.    I discussed the assessment and treatment plan with the patient. The patient was provided an opportunity to ask questions and all were answered. The patient agreed with the plan and demonstrated an understanding of the instructions.   The patient was advised to call back or seek an in-person evaluation if the symptoms worsen or if the condition fails to improve as anticipated.  Paige Dougherty OP Progress Note  10/13/2019 5:16 PM Paige Dougherty  MRN:  [id number]  Chief Complaint:  Chief Complaint    Follow-up     HPI: Paige Dougherty is an 86 year old Caucasian female, married, lives with her husband in [place], has a history of bipolar disorder, panic attacks, insomnia, hypertension, IBS, diabetes melitis, history of breast cancer, cognitive disorder was evaluated by telemedicine today.  Collateral information was obtained from Monica-daughter  Patient continues to be limited historian.  Patient had her eyes open during the entire session today which is quite different from her previous sessions.  She appears to be less apathetic, less depressed than her last visit.  She was able to answer questions that were asked with some prompting and support from her daughter.  She however continues to talk in short phrases.  She appeared to be alert, oriented to person place time and situation.  She was able to tell her date of birth as well as her address.  She was also able to tell what she ate for breakfast and lunch today.  Per daughter patient may have made some progress  since reducing the dosage of Zyprexa.  She however reports she continues to have episodes when she appears to be depressed, does not seem to be motivated to do anything.  She does keep her eyes open now most of the time.  She was able to sit outside in the sun.  She was able to go for  therapy visit with Ms. Miguel Dibble today which went very well. Her appetite seems to be improving.  She also has been sleeping.  Patient denies any suicidality, homicidality or perceptual disturbances.  She had her visit with her neurologist recently her medications were not readjusted however her Namenda was continued.   Visit Diagnosis:    ICD-10-CM   1. Bipolar disorder, in partial remission, most recent episode depressed ([place])  F31.75   2. GAD (generalized anxiety disorder)  F41.1   3. Panic attacks  F41.0   4. Major neurocognitive disorder due to Alzheimer's disease ([place])  G30.9    F02.80     Past Psychiatric History: I have reviewed past psychiatric history from my progress note on 01/06/2019.  Past trials of Lexapro, Celexa, mirtazapine, lorazepam, BuSpar, Seroquel.  Patient with recent inpatient admission at [facility] after a suicide attempt.  Past Medical History:  Past Medical History:  Diagnosis Date  . Allergy   . Anxiety   . Arthritis    HANDS/FEET  . Barrett's esophagus   . Breast cancer ([place]) 2016   RIGHT lumpectomy 2016 INVASIVE MAMMARY CARCINOMA   . Cancer of breast ([facility]) 12/24/2014  radiation- Right  . Chronic headache   . Depression   . Diabetes mellitus   . Diverticulosis   . Esophageal stricture   . Fibrocystic breast disease   . GERD (gastroesophageal reflux disease)   . Hyperlipemia   . Hypertension   . IBS (irritable bowel syndrome)   . Personal history of radiation therapy 2016   RIGHT lumpectomy    INVASIVE MAMMARY CARCINOMA   . Urinary incontinence     Past Surgical History:  Procedure Laterality Date  . ABDOMINAL HYSTERECTOMY     . BREAST BIOPSY Bilateral    core bxs  . BREAST EXCISIONAL BIOPSY Right 2016   +  . BREAST LUMPECTOMY Right 2016   INVASIVE MAMMARY CARCINOMA   . CARPAL TUNNEL RELEASE     bilateral  . CATARACT EXTRACTION     bilateral   . COLONOSCOPY    . DILATION AND CURETTAGE OF UTERUS    . finger cyst removal    . neck cyst removal    . UPPER GASTROINTESTINAL ENDOSCOPY      Family Psychiatric History: I have reviewed family psychiatric history from my progress note on 01/06/2019  Family History:  Family History  Problem Relation Age of Onset  . Colon cancer Father   . Thyroid disease Father   . Thyroid disease Mother   . Hypertension Mother   . Alzheimer's disease Mother   . Colon cancer Maternal Grandmother   . Breast cancer Maternal Grandmother   . Anxiety disorder Daughter     Social History: Reviewed social history from my progress note on 01/06/2019 Social History   Socioeconomic History  . Marital status: Married    Spouse name: Not on file  . Number of children: Not on file  . Years of education: Not on file  . Highest education level: Not on file  Occupational History  . Not on file  Tobacco Use  . Smoking status: Never Smoker  . Smokeless tobacco: Never Used  Substance and Sexual Activity  . Alcohol use: Yes    Comment: occasional  . Drug use: No  . Sexual activity: Not on file  Other Topics Concern  . Not on file  Social History Narrative  . Not on file   Social Determinants of Health   Financial Resource Strain:   . Difficulty of Paying Living Expenses: Not on file  Food Insecurity:   . Worried About Charity fundraiser in the Last Year: Not on file  . Ran Out of Food in the Last Year: Not on file  Transportation Needs:   . Lack of Transportation (Medical): Not on file  . Lack of Transportation (Non-Medical): Not on file  Physical Activity:   . Days of Exercise per Week: Not on file  . Minutes of Exercise per Session: Not on file  Stress:   . Feeling  of Stress : Not on file  Social Connections:   . Frequency of Communication with Friends and Family: Not on file  . Frequency of Social Gatherings with Friends and Family: Not on file  . Attends Religious Services: Not on file  . Active Member of Clubs or Organizations: Not on file  . Attends Archivist Meetings: Not on file  . Marital Status: Not on file    Allergies:  Allergies  Allergen Reactions  . Avapro [Irbesartan] Other (See Comments)    Other reaction(s): Unknown unknown  . Azithromycin Other (See Comments)    Extreme vaginal burning. unknown  .  Buspirone Other (See Comments)    Burning sensations and felt overly hot   . Clindamycin Other (See Comments)    Vaginal burning  . Duloxetine Other (See Comments)    Hyperactivity.  . Duloxetine Hcl Other (See Comments)    Hyperactivity.  . Ezetimibe-Simvastatin Other (See Comments)    Arthralgias.  . Lipitor [Atorvastatin] Other (See Comments)    "muscle aches"  . Metronidazole Other (See Comments)  . Penicillins   . Prednisone Other (See Comments)    Dizziness/double vision  . Procaine Other (See Comments)    tremors  . Procaine Hcl     tremors  . Eggs Or Egg-Derived Products Other (See Comments)    Other Reaction: Not Assessed  . Other Other (See Comments) and Anxiety    Novacaine weakness  . Statins Rash    Arthralgias.  . Sulfa Antibiotics Rash    Vague history of a sulfa allergy, but does not remember the reaction.  . Tetracycline Rash    Metabolic Disorder Labs: Lab Results  Component Value Date   HGBA1C 6.0 (H) 03/07/2019   MPG 125.5 03/07/2019   No results found for: PROLACTIN No results found for: CHOL, TRIG, HDL, CHOLHDL, VLDL, LDLCALC Lab Results  Component Value Date   TSH 2.517 03/07/2019    Therapeutic Level Labs: No results found for: LITHIUM No results found for: VALPROATE No components found for:  CBMZ  Current Medications: Current Outpatient Medications  Medication  Sig Dispense Refill  . senna (SENOKOT) 8.6 MG tablet Take by mouth.    . calcium-vitamin D (OSCAL WITH D) 500-200 MG-UNIT per tablet Take 1 tablet by mouth 2 (two) times daily. 60 tablet 6  . citalopram (CELEXA) 10 MG tablet TAKE 1 TABLET BY MOUTH EVERY DAY 90 tablet 1  . enalapril (VASOTEC) 10 MG tablet Take 10 mg by mouth 2 (two) times daily.    . indomethacin (INDOCIN) 25 MG capsule Take 25 mg by mouth 3 (three) times daily as needed.    Marland Kitchen LORazepam (ATIVAN) 0.5 MG tablet Take 1 tablet (0.5 mg total) by mouth at bedtime. 30 tablet 2  . memantine (NAMENDA) 5 MG tablet Take 5 mg by mouth 2 (two) times daily.    . metoprolol succinate (TOPROL-XL) 50 MG 24 hr tablet Take 1 tablet (50 mg total) by mouth daily. Take with or immediately following a meal. 30 tablet 0  . OLANZapine (ZYPREXA) 5 MG tablet Take 0.5 tablets (2.5 mg total) by mouth daily. Has supplies 90 tablet 1  . omeprazole (PRILOSEC) 20 MG capsule Take 1 capsule (20 mg total) by mouth daily.    . predniSONE (DELTASONE) 5 MG tablet Take by mouth.    . predniSONE (DELTASONE) 5 MG tablet Take 5 mg by mouth daily.    . Red Yeast Rice 600 MG CAPS Take by mouth.    . vitamin B-12 (CYANOCOBALAMIN) 1000 MCG tablet Take 1 tablet (1,000 mcg total) by mouth daily.     No current facility-administered medications for this visit.     Musculoskeletal: Strength & Muscle Tone: UTA Gait & Station: Appears to be seated Patient leans: N/A  Psychiatric Specialty Exam: Review of Systems  Unable to perform ROS: Dementia  Psychiatric/Behavioral: Negative for hallucinations and suicidal ideas.    There were no vitals taken for this visit.There is no height or weight on file to calculate BMI.  General Appearance: Casual  Eye Contact:  Fair  Speech:  Clear and Coherent  Volume:  Normal  Mood:  patient did not verbalize - however daughter reports she may be depressed  Affect:  Flat  Thought Process:  Linear and Descriptions of Associations:  Intact  Orientation:  Full (Time, Place, and Person)  Thought Content: Logical   Suicidal Thoughts:  No  Homicidal Thoughts:  No  Memory:  Immediate;   Fair Recent;   Fair Remote;   limited  Judgement:  Fair  Insight:  Fair  Psychomotor Activity:  Decreased  Concentration:  Concentration: Fair and Attention Span: Fair  Recall:  [employer] of Knowledge: Fair  Language: Fair  Akathisia:  No  Handed:  Right  AIMS (if indicated): UTA  Assets:  Housing Intimacy Social Support  ADL's:  Intact with support  Cognition: Impaired,  Mild  Sleep:  Fair   Screenings: PHQ2-9     Follow Up  from 12/03/2017 in [facility] Follow Up  from 07/01/2016 in [facility] Follow Up  from 07/03/2015 in [facility]  PHQ-2 Total Score  0  0  0       Assessment and Plan: Anishia is an 86 year old Caucasian female, married, lives in [place], has a history of bipolar disorder, GAD, panic attacks, hypertension, IBS, diabetes, history of breast cancer, dementia was evaluated by telemedicine today.  Patient is biologically predisposed given her history of mental health problems, her own health problems.  She also has psychosocial stressors of the current pandemic.  Patient is currently making some progress since reducing the dosage of Zyprexa.  Plan as noted below.  Plan Bipolar disorder type II in partial remission Discussed with patient as well as daughter to hold olanzapine 2.5 mg for now.  If she does have sleep problems could use it as needed.  It will also be used for agitation or anxiety symptoms as needed. Daughter to monitor patient closely. Continue Celexa 10 mg p.o. daily.  Panic disorder-improving Lorazepam 0.5 mg p.o. nightly Celexa 10 mg p.o. daily  GAD-stable Celexa 10 mg p.o. daily  Dementia-Alzheimer's-unstable I have reviewed neurology notes per Dr. Manuella Ghazi dated  10/07/2019-moderate to advanced dementia in a patient with major bipolar type II disorder, GAD, positive family history of dementia in her mother.  MRI brain-06/04/2019-no acute or reversible finding-age-related atrophy. Vitamin B12, folate, syphilis screening-looked okay. Namenda 5 mg p.o. twice daily.-Will hold off increase secondary to renal function.  Collateral information obtained from daughter-Monica as summarized above.  We will coordinate care with Ms. Miguel Dibble.  Follow-up in clinic in 3 weeks or sooner if needed.  I have spent atleast 30 minutes non face to face with patient today. More than 50 % of the time was spent for preparing to see the patient ( e.g., review of test, records ), obtaining and to review and separately obtained history , ordering medications and test ,psychoeducation and supportive psychotherapy and care coordination,as well as documenting clinical information in electronic health record. This note was generated in part or whole with voice recognition software. Voice recognition is usually quite accurate but there are transcription errors that can and very often do occur. I apologize for any typographical errors that were not detected and corrected.       Ursula Alert, Dougherty 10/13/2019, 5:16 PM

## 2019-10-21 ENCOUNTER — Telehealth: Payer: Self-pay

## 2019-10-21 DIAGNOSIS — F3181 Bipolar II disorder: Secondary | ICD-10-CM

## 2019-10-21 DIAGNOSIS — F410 Panic disorder [episodic paroxysmal anxiety] without agoraphobia: Secondary | ICD-10-CM

## 2019-10-21 DIAGNOSIS — F0150 Vascular dementia without behavioral disturbance: Secondary | ICD-10-CM | POA: Insufficient documentation

## 2019-10-21 DIAGNOSIS — F411 Generalized anxiety disorder: Secondary | ICD-10-CM

## 2019-10-21 DIAGNOSIS — G309 Alzheimer's disease, unspecified: Secondary | ICD-10-CM | POA: Insufficient documentation

## 2019-10-21 NOTE — Telephone Encounter (Signed)
Patient's daughter called regarding her mom's medication. She stated that her mom has been very shaky and was taken off her Bipolar 1 medication. She stated that since she's been off the other medication, she's had extra anxiety and again very shaky. Daughter is wondering if an increase in her Celexa would help. Please review and advise. Thank you.

## 2019-10-22 ENCOUNTER — Telehealth: Payer: Self-pay

## 2019-10-22 MED ORDER — CITALOPRAM HYDROBROMIDE 10 MG PO TABS: 10.0000 mg | ORAL_TABLET | Freq: Two times a day (BID) | ORAL | 1 refills | Status: DC

## 2019-10-22 NOTE — Telephone Encounter (Signed)
Returned call to [employer]. Per daughter patient is shaky and agitated since being off of the olanzapine.  She appears to be more alert and less fatigued which are improvements.  She is also not able to sleep through the night and wakes up around 3 or 4 AM.  She wonders whether patient can have melatonin at bedtime for sleep. Will restart olanzapine-one fourth of 5 mg tablet at bedtime. Increase Celexa to 10 mg p.o. twice daily. Start melatonin one 1 mg p.o. nightly and increase it to a 5 mg as needed.

## 2019-10-22 NOTE — Telephone Encounter (Signed)
Medication management - Telephone call with pt's daughter after she had left a message of concerns her Mother may need some medication adjustments. Collateral reported Dr. Shea Evans had already called her back today to address. Will call back if more issues

## 2019-11-09 ENCOUNTER — Other Ambulatory Visit: Payer: Self-pay

## 2019-11-09 ENCOUNTER — Ambulatory Visit (INDEPENDENT_AMBULATORY_CARE_PROVIDER_SITE_OTHER): Payer: Medicare HMO | Admitting: Psychiatry

## 2019-11-09 ENCOUNTER — Other Ambulatory Visit: Payer: Self-pay | Admitting: Psychiatry

## 2019-11-09 ENCOUNTER — Encounter: Payer: Self-pay | Admitting: Psychiatry

## 2019-11-09 DIAGNOSIS — F3176 Bipolar disorder, in full remission, most recent episode depressed: Secondary | ICD-10-CM

## 2019-11-09 DIAGNOSIS — G309 Alzheimer's disease, unspecified: Secondary | ICD-10-CM

## 2019-11-09 DIAGNOSIS — F0280 Dementia in other diseases classified elsewhere without behavioral disturbance: Secondary | ICD-10-CM

## 2019-11-09 DIAGNOSIS — F411 Generalized anxiety disorder: Secondary | ICD-10-CM

## 2019-11-09 DIAGNOSIS — F410 Panic disorder [episodic paroxysmal anxiety] without agoraphobia: Secondary | ICD-10-CM | POA: Diagnosis not present

## 2019-11-09 MED ORDER — LORAZEPAM 0.5 MG PO TABS: 0.2500 mg | ORAL_TABLET | ORAL | 1 refills | Status: AC

## 2019-11-09 MED ORDER — CITALOPRAM HYDROBROMIDE 10 MG PO TABS: 10.0000 mg | ORAL_TABLET | Freq: Every day | ORAL | 1 refills | Status: AC

## 2019-11-09 NOTE — Progress Notes (Signed)
Provider Location : [facility] Patient Location : Home  Virtual Visit via Video Note  I connected with [name] on 11/09/19 at  1:30 PM EDT by a video enabled telemedicine application and verified that I am speaking with the correct person using two identifiers.   I discussed the limitations of evaluation and management by telemedicine and the availability of in person appointments. The patient expressed understanding and agreed to proceed.     I discussed the assessment and treatment plan with the patient. The patient was provided an opportunity to ask questions and all were answered. The patient agreed with the plan and demonstrated an understanding of the instructions.   The patient was advised to call back or seek an in-person evaluation if the symptoms worsen or if the condition fails to improve as anticipated.   [place] MD OP Progress Note  11/09/2019 2:22 PM LAQUINA SHORTRIDGE  MRN:  [id number]  Chief Complaint:  Chief Complaint    Follow-up     HPI: Zilpha is an 86 year old Caucasian female, married, lives with her husband in [place], has a history of bipolar disorder, panic attacks, insomnia, hypertension, IBS, diabetes melitis, history of breast cancer, cognitive disorder was evaluated by telemedicine today.  Collateral information was obtained from Monica-daughter who was present in session today.  Patient continues to be a limited historian.  She however appears to be more verbal today and was able to answer at least some questions in short phrases.  She had better eye contact and seems to be more engaged.  Patient reports she is currently shaking a lot and is worried about falls.  She also reports she is anxious and nervous all the time.  Patient denies any suicidality, perceptual disturbances.  She appeared to be alert and oriented to self.  She however was not able to give the date.  She was unable to tell me what holidays she celebrated on Sunday.  She however was able to tell me  her date of birth.  Per [employer] since going up on the Celexa and coming off of the olanzapine patient has been shaking more.  She also feels tired during the day and taking naps due to the same.  She is anxious more often.  She is communicating more however when she communicates it is more about how anxious she is feeling.  She does not seem to be apathetic or depressed as she was before.  Since coming off of the olanzapine that has improved.  She is going for psychotherapy sessions with Ms. Miguel Dibble which is going well.  I have also obtained collateral information from Ms. Otila Kluver Thompson-therapist who reports patient has been compliant with therapy sessions.  She however appears to be anxious or nervous all the time.   Visit Diagnosis:    ICD-10-CM   1. Bipolar disorder, in full remission, most recent episode depressed (HCC)  F31.76 LORazepam (ATIVAN) 0.5 MG tablet   type 2  2. GAD (generalized anxiety disorder)  F41.1 citalopram (CELEXA) 10 MG tablet    LORazepam (ATIVAN) 0.5 MG tablet  3. Panic attacks  F41.0 citalopram (CELEXA) 10 MG tablet    LORazepam (ATIVAN) 0.5 MG tablet  4. Major neurocognitive disorder due to Alzheimer's disease ([place])  G30.9    F02.80     Past Psychiatric History: I have reviewed past psychiatric history from my progress note on 01/06/2019.  Past trials of Lexapro, Celexa, mirtazapine, lorazepam, BuSpar, Seroquel.  Patient with recent inpatient admission at [facility] after a suicide attempt.  Past Medical History:  Past Medical History:  Diagnosis Date  . Allergy   . Anxiety   . Arthritis    HANDS/FEET  . Barrett's esophagus   . Breast cancer ([place]) 2016   RIGHT lumpectomy 2016 INVASIVE MAMMARY CARCINOMA   . Cancer of breast ([place]) 12/24/2014   radiation- Right  . Chronic headache   . Depression   . Diabetes mellitus   . Diverticulosis   . Esophageal stricture   . Fibrocystic breast disease   . GERD (gastroesophageal  reflux disease)   . Hyperlipemia   . Hypertension   . IBS (irritable bowel syndrome)   . Personal history of radiation therapy 2016   RIGHT lumpectomy    INVASIVE MAMMARY CARCINOMA   . Urinary incontinence     Past Surgical History:  Procedure Laterality Date  . ABDOMINAL HYSTERECTOMY    . BREAST BIOPSY Bilateral    core bxs  . BREAST EXCISIONAL BIOPSY Right 2016   +  . BREAST LUMPECTOMY Right 2016   INVASIVE MAMMARY CARCINOMA   . CARPAL TUNNEL RELEASE     bilateral  . CATARACT EXTRACTION     bilateral   . COLONOSCOPY    . DILATION AND CURETTAGE OF UTERUS    . finger cyst removal    . neck cyst removal    . UPPER GASTROINTESTINAL ENDOSCOPY      Family Psychiatric History: I have reviewed family psychiatric history from my progress note on 01/06/2019.  Family History:  Family History  Problem Relation Age of Onset  . Colon cancer Father   . Thyroid disease Father   . Thyroid disease Mother   . Hypertension Mother   . Alzheimer's disease Mother   . Colon cancer Maternal Grandmother   . Breast cancer Maternal Grandmother   . Anxiety disorder Daughter     Social History: I have reviewed social history from my progress note on 01/06/2019 Social History   Socioeconomic History  . Marital status: Married    Spouse name: Not on file  . Number of children: Not on file  . Years of education: Not on file  . Highest education level: Not on file  Occupational History  . Not on file  Tobacco Use  . Smoking status: Never Smoker  . Smokeless tobacco: Never Used  Substance and Sexual Activity  . Alcohol use: Yes    Comment: occasional  . Drug use: No  . Sexual activity: Not on file  Other Topics Concern  . Not on file  Social History Narrative  . Not on file   Social Determinants of Health   Financial Resource Strain:   . Difficulty of Paying Living Expenses:   Food Insecurity:   . Worried About Charity fundraiser in the Last Year:   . Arboriculturist in the Last  Year:   Transportation Needs:   . Film/video editor (Medical):   Marland Kitchen Lack of Transportation (Non-Medical):   Physical Activity:   . Days of Exercise per Week:   . Minutes of Exercise per Session:   Stress:   . Feeling of Stress :   Social Connections:   . Frequency of Communication with Friends and Family:   . Frequency of Social Gatherings with Friends and Family:   . Attends Religious Services:   . Active Member of Clubs or Organizations:   . Attends Archivist Meetings:   Marland Kitchen Marital Status:  Allergies:  Allergies  Allergen Reactions  . Avapro [Irbesartan] Other (See Comments)    Other reaction(s): Unknown unknown  . Azithromycin Other (See Comments)    Extreme vaginal burning. unknown  . Buspirone Other (See Comments)    Burning sensations and felt overly hot   . Clindamycin Other (See Comments)    Vaginal burning  . Duloxetine Other (See Comments)    Hyperactivity.  . Duloxetine Hcl Other (See Comments)    Hyperactivity.  . Ezetimibe-Simvastatin Other (See Comments)    Arthralgias.  . Lipitor [Atorvastatin] Other (See Comments)    "muscle aches"  . Metronidazole Other (See Comments)  . Penicillins   . Prednisone Other (See Comments)    Dizziness/double vision  . Procaine Other (See Comments)    tremors  . Procaine Hcl     tremors  . Eggs Or Egg-Derived Products Other (See Comments)    Other Reaction: Not Assessed  . Other Other (See Comments) and Anxiety    Novacaine weakness  . Statins Rash    Arthralgias.  . Sulfa Antibiotics Rash    Vague history of a sulfa allergy, but does not remember the reaction.  . Tetracycline Rash    Metabolic Disorder Labs: Lab Results  Component Value Date   HGBA1C 6.0 (H) 03/07/2019   MPG 125.5 03/07/2019   No results found for: PROLACTIN No results found for: CHOL, TRIG, HDL, CHOLHDL, VLDL, LDLCALC Lab Results  Component Value Date   TSH 2.517 03/07/2019    Therapeutic Level Labs: No results  found for: LITHIUM No results found for: VALPROATE No components found for:  CBMZ  Current Medications: Current Outpatient Medications  Medication Sig Dispense Refill  . calcium-vitamin D (OSCAL WITH D) 500-200 MG-UNIT per tablet Take 1 tablet by mouth 2 (two) times daily. 60 tablet 6  . citalopram (CELEXA) 10 MG tablet Take 1 tablet (10 mg total) by mouth daily. 30 tablet 1  . enalapril (VASOTEC) 10 MG tablet Take 10 mg by mouth 2 (two) times daily.    . indomethacin (INDOCIN) 25 MG capsule Take 25 mg by mouth 3 (three) times daily as needed.    Marland Kitchen LORazepam (ATIVAN) 0.5 MG tablet Take 0.5-1 tablets (0.25-0.5 mg total) by mouth as directed. Take half tablet as needed during the day for anxiety and one tablet at bedtime 45 tablet 1  . memantine (NAMENDA) 5 MG tablet Take 5 mg by mouth 2 (two) times daily.    . metoprolol succinate (TOPROL-XL) 50 MG 24 hr tablet Take 1 tablet (50 mg total) by mouth daily. Take with or immediately following a meal. 30 tablet 0  . omeprazole (PRILOSEC) 20 MG capsule Take 1 capsule (20 mg total) by mouth daily.    . predniSONE (DELTASONE) 5 MG tablet Take by mouth.    . predniSONE (DELTASONE) 5 MG tablet Take 5 mg by mouth daily.    . Red Yeast Rice 600 MG CAPS Take by mouth.    . senna (SENOKOT) 8.6 MG tablet Take by mouth.    . vitamin B-12 (CYANOCOBALAMIN) 1000 MCG tablet Take 1 tablet (1,000 mcg total) by mouth daily.     No current facility-administered medications for this visit.     Musculoskeletal: Strength & Muscle Tone: UTA Gait & Station: Walks with walker Patient leans: N/A  Psychiatric Specialty Exam: Review of Systems  Unable to perform ROS: Dementia    There were no vitals taken for this visit.There is no height or weight on file to  calculate BMI.  General Appearance: Casual  Eye Contact:  Fair  Speech:  Normal Rate  Volume:  Decreased  Mood:  Anxious  Affect:  Congruent  Thought Process:  Goal Directed and Descriptions of  Associations: Intact  Orientation:  Other:  to self , situation  Thought Content: Logical   Suicidal Thoughts:  No  Homicidal Thoughts:  No  Memory:  Immediate;   limited Recent;   limited Remote;   limited  Judgement:  Fair  Insight:  Shallow  Psychomotor Activity:  Tremor  Concentration:  Concentration: Fair and Attention Span: Fair  Recall:  Poor  Fund of Knowledge: Poor  Language: Fair  Akathisia:  No  Handed:  Right  AIMS: UTA  Assets:  Housing Social Support  ADL's:  Intact  Cognition: Impaired,  Moderate  Sleep:  Fair   Screenings: PHQ2-9     Follow Up  from 12/03/2017 in [facility] Follow Up  from 07/01/2016 in [facility] Follow Up  from 07/03/2015 in [facility]  PHQ-2 Total Score  0  0  0       Assessment and Plan: Tai is an 86 year old Caucasian female, married, lives in [place], has a history of bipolar disorder, GAD, panic attacks, hypertension, IBS, diabetes, history of breast cancer, dementia was evaluated by telemedicine today.  She is biologically predisposed given her history of mental health problems, her own health problems.  She also has psychosocial stressors of the current pandemic.  Patient is currently struggling with anxiety symptoms, fear of falls and possible adverse side effects to Celexa.  Plan as noted below.  Plan Bipolar disorder in remission We will monitor closely.   Panic disorder-unstable Increase lorazepam to 0.25 mg p.o. daily as needed and 0.5 mg p.o. nightly Reduce Celexa to 10 mg p.o. daily.  The dosage was recently increased to 10 twice daily and she reports increased shakiness and fatigue from the same.  GAD-unstable Celexa 10 mg p.o. daily Increased dosage of lorazepam to 0.25 mg p.o. daily as needed and 0.5 mg p.o. nightly  Dementia-Alzheimer's-unstable She will continue to follow-up with  neurologist. Continue Namenda.  Collateral information was obtained from daughter-Monica as summarized above.  Collateral information was obtained from therapist Ms. Miguel Dibble.  Follow-up in clinic in 3 to 4 weeks or sooner if needed.  I have spent atleast 30 minutes non face to face with patient today. More than 50 % of the time was spent for preparing to see the patient ( e.g., review of test, records ), obtaining and to review and separately obtained history , ordering medications and test ,psychoeducation and supportive psychotherapy and care coordination,as well as documenting clinical information in electronic health record. This note was generated in part or whole with voice recognition software. Voice recognition is usually quite accurate but there are transcription errors that can and very often do occur. I apologize for any typographical errors that were not detected and corrected.        Ursula Alert, MD 11/09/2019, 2:22 PM

## 2019-12-09 ENCOUNTER — Telehealth: Payer: Medicare HMO | Admitting: Psychiatry

## 2019-12-13 ENCOUNTER — Telehealth: Payer: Self-pay

## 2019-12-13 NOTE — Telephone Encounter (Signed)
ms. thompson called states that pt daughter [name] called her and said that her mom is in hopsics and they dont expect her to live past the week.  she is not eatting. Otila Kluver told the daughter that she would notify you

## 2019-12-13 NOTE — Telephone Encounter (Signed)
Thank you for letting me know

## 2019-12-17 ENCOUNTER — Telehealth: Payer: Self-pay | Admitting: Psychiatry

## 2019-12-17 NOTE — Telephone Encounter (Signed)
Attempted to reach Paige Dougherty-daughter since Probation officer received a call from therapist Ms. Miguel Dibble that patient is currently in hospice.  Left voicemail.

## 2020-06-05 DEATH — deceased
